# Patient Record
Sex: Male | Born: 1960 | Race: White | Hispanic: No | Marital: Married | Smoking: Never smoker
Health system: Southern US, Community
[De-identification: ages and names within clinical notes are randomized; demographics above are authoritative.]

## PROBLEM LIST (undated history)

## (undated) DIAGNOSIS — F32A Depression, unspecified: Secondary | ICD-10-CM

## (undated) DIAGNOSIS — F329 Major depressive disorder, single episode, unspecified: Secondary | ICD-10-CM

## (undated) DIAGNOSIS — I10 Essential (primary) hypertension: Secondary | ICD-10-CM

## (undated) DIAGNOSIS — E119 Type 2 diabetes mellitus without complications: Secondary | ICD-10-CM

## (undated) DIAGNOSIS — E785 Hyperlipidemia, unspecified: Secondary | ICD-10-CM

## (undated) DIAGNOSIS — I2510 Atherosclerotic heart disease of native coronary artery without angina pectoris: Secondary | ICD-10-CM

## (undated) DIAGNOSIS — K219 Gastro-esophageal reflux disease without esophagitis: Secondary | ICD-10-CM

## (undated) HISTORY — DX: Depression, unspecified: F32.A

## (undated) HISTORY — DX: Major depressive disorder, single episode, unspecified: F32.9

## (undated) HISTORY — PX: APPENDECTOMY: SHX54

## (undated) HISTORY — DX: Atherosclerotic heart disease of native coronary artery without angina pectoris: I25.10

## (undated) HISTORY — DX: Essential (primary) hypertension: I10

## (undated) HISTORY — PX: CORONARY ANGIOPLASTY WITH STENT PLACEMENT: SHX49

---

## 1999-07-03 ENCOUNTER — Encounter: Payer: Self-pay | Admitting: Emergency Medicine

## 1999-07-04 ENCOUNTER — Inpatient Hospital Stay (HOSPITAL_COMMUNITY): Admission: EM | Admit: 1999-07-04 | Discharge: 1999-07-08 | Payer: Self-pay | Admitting: Emergency Medicine

## 1999-07-08 ENCOUNTER — Inpatient Hospital Stay (HOSPITAL_COMMUNITY): Admission: AD | Admit: 1999-07-08 | Discharge: 1999-07-18 | Payer: Self-pay | Admitting: Cardiology

## 1999-07-25 ENCOUNTER — Ambulatory Visit (HOSPITAL_COMMUNITY): Admission: RE | Admit: 1999-07-25 | Discharge: 1999-07-26 | Payer: Self-pay | Admitting: Cardiology

## 1999-07-26 ENCOUNTER — Encounter: Payer: Self-pay | Admitting: Cardiology

## 2000-09-26 ENCOUNTER — Encounter: Payer: Self-pay | Admitting: Cardiology

## 2000-09-26 ENCOUNTER — Inpatient Hospital Stay (HOSPITAL_COMMUNITY): Admission: AD | Admit: 2000-09-26 | Discharge: 2000-09-30 | Payer: Self-pay | Admitting: Cardiology

## 2001-03-03 ENCOUNTER — Observation Stay (HOSPITAL_COMMUNITY): Admission: EM | Admit: 2001-03-03 | Discharge: 2001-03-04 | Payer: Self-pay | Admitting: Emergency Medicine

## 2001-03-03 ENCOUNTER — Encounter: Payer: Self-pay | Admitting: Emergency Medicine

## 2001-03-04 ENCOUNTER — Encounter: Payer: Self-pay | Admitting: Cardiology

## 2001-12-11 ENCOUNTER — Encounter: Payer: Self-pay | Admitting: Cardiology

## 2001-12-11 ENCOUNTER — Inpatient Hospital Stay (HOSPITAL_COMMUNITY): Admission: AD | Admit: 2001-12-11 | Discharge: 2001-12-13 | Payer: Self-pay | Admitting: Cardiology

## 2001-12-13 ENCOUNTER — Encounter: Payer: Self-pay | Admitting: Cardiology

## 2003-04-29 ENCOUNTER — Emergency Department (HOSPITAL_COMMUNITY): Admission: EM | Admit: 2003-04-29 | Discharge: 2003-04-30 | Payer: Self-pay | Admitting: Emergency Medicine

## 2007-01-02 ENCOUNTER — Emergency Department (HOSPITAL_COMMUNITY): Admission: EM | Admit: 2007-01-02 | Discharge: 2007-01-02 | Payer: Self-pay | Admitting: Emergency Medicine

## 2007-01-07 ENCOUNTER — Other Ambulatory Visit (HOSPITAL_COMMUNITY): Admission: RE | Admit: 2007-01-07 | Discharge: 2007-01-18 | Payer: Self-pay | Admitting: Psychiatry

## 2007-01-07 ENCOUNTER — Ambulatory Visit: Payer: Self-pay | Admitting: Psychiatry

## 2007-04-05 ENCOUNTER — Ambulatory Visit: Payer: Self-pay | Admitting: Cardiology

## 2007-05-24 ENCOUNTER — Ambulatory Visit: Payer: Self-pay | Admitting: Cardiology

## 2007-06-02 ENCOUNTER — Emergency Department (HOSPITAL_COMMUNITY): Admission: EM | Admit: 2007-06-02 | Discharge: 2007-06-02 | Payer: Self-pay | Admitting: Emergency Medicine

## 2008-04-16 ENCOUNTER — Emergency Department (HOSPITAL_BASED_OUTPATIENT_CLINIC_OR_DEPARTMENT_OTHER): Admission: EM | Admit: 2008-04-16 | Discharge: 2008-04-16 | Payer: Self-pay | Admitting: Emergency Medicine

## 2008-05-06 ENCOUNTER — Ambulatory Visit: Payer: Self-pay | Admitting: Cardiology

## 2009-06-21 DIAGNOSIS — E785 Hyperlipidemia, unspecified: Secondary | ICD-10-CM | POA: Insufficient documentation

## 2009-06-21 DIAGNOSIS — I2510 Atherosclerotic heart disease of native coronary artery without angina pectoris: Secondary | ICD-10-CM | POA: Insufficient documentation

## 2009-06-21 DIAGNOSIS — J45909 Unspecified asthma, uncomplicated: Secondary | ICD-10-CM | POA: Insufficient documentation

## 2009-06-21 DIAGNOSIS — I10 Essential (primary) hypertension: Secondary | ICD-10-CM | POA: Insufficient documentation

## 2009-06-24 ENCOUNTER — Ambulatory Visit: Payer: Self-pay | Admitting: Cardiology

## 2009-07-06 ENCOUNTER — Telehealth: Payer: Self-pay | Admitting: Cardiology

## 2010-04-16 ENCOUNTER — Ambulatory Visit: Payer: Self-pay | Admitting: Diagnostic Radiology

## 2010-04-16 ENCOUNTER — Encounter: Payer: Self-pay | Admitting: Emergency Medicine

## 2010-04-17 ENCOUNTER — Encounter (INDEPENDENT_AMBULATORY_CARE_PROVIDER_SITE_OTHER): Payer: Self-pay | Admitting: Internal Medicine

## 2010-04-17 ENCOUNTER — Ambulatory Visit: Payer: Self-pay | Admitting: Cardiology

## 2010-04-17 ENCOUNTER — Inpatient Hospital Stay (HOSPITAL_COMMUNITY): Admission: EM | Admit: 2010-04-17 | Discharge: 2010-04-20 | Payer: Self-pay | Admitting: Cardiology

## 2010-05-03 ENCOUNTER — Encounter: Payer: Self-pay | Admitting: Cardiology

## 2010-05-12 ENCOUNTER — Encounter: Payer: Self-pay | Admitting: Physician Assistant

## 2010-05-12 ENCOUNTER — Ambulatory Visit: Payer: Self-pay | Admitting: Cardiology

## 2010-07-05 ENCOUNTER — Ambulatory Visit: Payer: Self-pay | Admitting: Cardiology

## 2010-10-04 NOTE — Assessment & Plan Note (Signed)
Summary: eph   Visit Type:  Follow-up Primary Provider:  Leonette Most   History of Present Illness: This is a 50 year old white male patient who was admitted to the hospital with unstable angina. He has a history of a previous stent to the RCA with intervention in 2002 for in-stent restenosis. He underwent cardiac catheterization in April 18, 2010 and was found to have a high-grade AV circumflex stenosis. He underwent percutaneous angioplasty to the distal AV circumflex April 19, 2010.  The patient says he is doing much better since he's been home he has an occasional twinge of chest tightness but he says this is very different from his angina. He describes his angina as being a heaviness with numbness and tingling in his left hand. He has not experienced any of this. He is back to work but does not exercise on a regular basis. He does not smoke. He denies dyspnea dyspnea on exertion dizziness or presyncope.  Current Medications (verified): 1)  Zetia 10 Mg Tabs (Ezetimibe) .... Take One Tablet By Mouth Daily. 2)  Crestor 40 Mg Tabs (Rosuvastatin Calcium) .... Take One Tablet By Mouth Daily. 3)  Nexium 40 Mg Cpdr (Esomeprazole Magnesium) .... Take 1 Capsule By Mouth Once A Day 4)  Singulair 10 Mg Tabs (Montelukast Sodium) .... Take 1 Tablet By Mouth Once A Day 5)  Mirtazapine 15 Mg Tabs (Mirtazapine) .... Take 1 Tablet By Mouth Once A Day 6)  Aspirin 81 Mg Tbec (Aspirin) .... Take One Tablet By Mouth Daily 7)  Lorazepam 0.5 Mg Tabs (Lorazepam) .... As Needed 8)  Metoprolol Succinate 25 Mg Xr24h-Tab (Metoprolol Succinate) .... Take One Tablet By Mouth Daily 9)  Wellbutrin Xl 300 Mg Xr24h-Tab (Bupropion Hcl) .... Take 1 Tablet By Mouth Once A Day 10)  Fluoxetine Hcl 40 Mg Caps (Fluoxetine Hcl) .... Take 1 Capsule By Mouth Two Times A Day  Allergies (verified): 1)  ! * Dextromethorphan  Past History:  Past Medical History: Last updated: 06/21/2009 Current Problems:  CAD  (ICD-414.00) HYPERTENSION, UNSPECIFIED (ICD-401.9) DYSLIPIDEMIA (ICD-272.4) ASTHMA (ICD-493.90)  Social History: Reviewed history from 06/21/2009 and no changes required.  The patient is married with two children.  He denies tobacco or ethanol use. works at [employer]  Review of Systems       see history of present illness  Vital Signs:  Patient profile:   50 year old male Height:      65 inches Weight:      171 pounds BMI:     28.56 Pulse rate:   65 / minute BP sitting:   124 / 76  (left arm) Cuff size:   large  Vitals Entered By: Burnett Kanaris, CNA (May 12, 2010 8:15 AM)  Physical Exam  General:   Well-nournished, in no acute distress. Neck: No JVD, HJR, Bruit, or thyroid enlargement Lungs: No tachypnea, clear without wheezing, rales, or rhonchi Cardiovascular: RRR, PMI not displaced, heart sounds normal, no murmurs, gallops, bruit, thrill, or heave. Abdomen: BS normal. Soft without organomegaly, masses, lesions or tenderness. Extremities:right groin without hematoma or hemorrhage, right radial artery without hematoma or hemorrhage good, brachial pulse, lower extremities without cyanosis, clubbing or edema. Good distal pulses bilateral SKin: Warm, no lesions or rashes  Musculoskeletal: No deformities Neuro: no focal signs    EKG  Procedure date:  05/12/2010  Findings:      normal sinus rhythm  Impression & Recommendations:  Problem # 1:  CAD (ICD-414.00) Patient underwent percutaneous angioplasty to the distal  AV circumflex April 19, 2010. He is prior stenting of an RCA in 2000 with recurrent intervention in 2002. Patient is stable. Continue aspirin 325 mg daily. The following medications were removed from the medication list:    Aspirin 81 Mg Tbec (Aspirin) .Marland Kitchen... Take one tablet by mouth daily His updated medication list for this problem includes:    Metoprolol Succinate 25 Mg Xr24h-tab (Metoprolol succinate) .Marland Kitchen... Take one tablet by mouth  daily  Orders: EKG w/ Interpretation (93000)  Problem # 2:  HYPERTENSION, UNSPECIFIED (ICD-401.9) blood pressure is controlled The following medications were removed from the medication list:    Aspirin 81 Mg Tbec (Aspirin) .Marland Kitchen... Take one tablet by mouth daily His updated medication list for this problem includes:    Metoprolol Succinate 25 Mg Xr24h-tab (Metoprolol succinate) .Marland Kitchen... Take one tablet by mouth daily    Aspirin 325 Mg Tabs (Aspirin) .Marland Kitchen... Take one tablet by mouth once a day.  Problem # 3:  DYSLIPIDEMIA (ICD-272.4) treated His updated medication list for this problem includes:    Zetia 10 Mg Tabs (Ezetimibe) .Marland Kitchen... Take one tablet by mouth daily.    Crestor 40 Mg Tabs (Rosuvastatin calcium) .Marland Kitchen... Take one tablet by mouth daily.  Patient Instructions: 1)  Your physician recommends that you schedule a follow-up appointment in: 2 months with Dr. Riley Kill 2)  Your physician recommends that you continue on your current medications as directed. Please refer to the Current Medication list given to you today. 3)  Your physician discussed the importance of regular exercise and recommended that you start or continue a regular exercise program for good health.

## 2010-10-04 NOTE — Assessment & Plan Note (Signed)
Summary: 2 month rov   Visit Type:  Follow-up Primary Gera Inboden:  Alexander Calderon  CC:  No complaints.  History of Present Illness: Doing well at the present time.  Not having any chest pain.  Drinks 3-4 beers two times per week, and we discussed this at some length.  Is able to do whatever he wants without difficulty.  Denies ongoing problems.    Current Medications (verified): 1)  Zetia 10 Mg Tabs (Ezetimibe) .... Take One Tablet By Mouth Daily. 2)  Crestor 40 Mg Tabs (Rosuvastatin Calcium) .... Take One Tablet By Mouth Daily. 3)  Nexium 40 Mg Cpdr (Esomeprazole Magnesium) .... Take 1 Capsule By Mouth Once A Day 4)  Singulair 10 Mg Tabs (Montelukast Sodium) .... Take 1 Tablet By Mouth Once A Day 5)  Mirtazapine 15 Mg Tabs (Mirtazapine) .... Take 1 Tablet By Mouth Once A Day 6)  Lorazepam 0.5 Mg Tabs (Lorazepam) .... As Needed 7)  Metoprolol Succinate 25 Mg Xr24h-Tab (Metoprolol Succinate) .... Take One Tablet By Mouth Daily 8)  Wellbutrin Xl 300 Mg Xr24h-Tab (Bupropion Hcl) .... Take 1 Tablet By Mouth Once A Day 9)  Fluoxetine Hcl 40 Mg Caps (Fluoxetine Hcl) .... Take 1 Capsule By Mouth Two Times A Day 10)  Aspirin 325 Mg Tabs (Aspirin) .... Take One Tablet By Mouth Once A Day.  Allergies: 1)  ! * Dextromethorphan  Past History:  Past Medical History: Last updated: 06/21/2009 Current Problems:  CAD (ICD-414.00) HYPERTENSION, UNSPECIFIED (ICD-401.9) DYSLIPIDEMIA (ICD-272.4) ASTHMA (ICD-493.90)  Past Surgical History: Last updated: 06/21/2009 Multiple stent placements to the right coronary artery. Appendectomy  Family History: Last updated: 06/21/2009  Notable for father dying at age 57 with myocardial infarction.  Social History: Last updated: 05/12/2010  The patient is married with two children.  He denies tobacco or ethanol use. works at [employer]  Vital Signs:  Patient profile:   50 year old male Height:      65 inches Weight:      169.25 pounds BMI:      28.27 Pulse rate:   72 / minute Pulse rhythm:   regular Resp:     18 per minute BP sitting:   118 / 80  (left arm) Cuff size:   large  Vitals Entered By: Vikki Ports (July 05, 2010 4:04 PM)  Physical Exam  General:  Well developed, well nourished, in no acute distress. Head:  normocephalic and atraumatic Eyes:  PERRLA/EOM intact; conjunctiva and lids normal. Lungs:  Clear bilaterally to auscultation and percussion. Heart:  PMI non displaced. Normal S1 and S2.  No definite murmur.   Pulses:  pulses normal in all 4 extremities Extremities:  No clubbing or cyanosis. Neurologic:  Alert and oriented x 3.   Impression & Recommendations:  Problem # 1:  CAD (ICD-414.00) Gernerally stable. PCI of CFX artery distally with improvement in symtpoms shortly thereafter.  Will continue on medical therpay.  Currently on very low dose.  Will reduce ASA to 81 mg at present.  His updated medication list for this problem includes:    Metoprolol Succinate 25 Mg Xr24h-tab (Metoprolol succinate) .Marland Kitchen... Take one tablet by mouth daily    Aspirin 81 Mg Tbec (Aspirin) .Marland Kitchen... Take one tablet by mouth daily  Problem # 2:  HYPERTENSION, UNSPECIFIED (ICD-401.9) BP controlled at present.   His updated medication list for this problem includes:    Metoprolol Succinate 25 Mg Xr24h-tab (Metoprolol succinate) .Marland Kitchen... Take one tablet by mouth daily    Aspirin  81 Mg Tbec (Aspirin) .Marland Kitchen... Take one tablet by mouth daily  His updated medication list for this problem includes:    Metoprolol Succinate 25 Mg Xr24h-tab (Metoprolol succinate) .Marland Kitchen... Take one tablet by mouth daily    Aspirin 325 Mg Tabs (Aspirin) .Marland Kitchen... Take one tablet by mouth once a day.  Problem # 3:  DYSLIPIDEMIA (ICD-272.4) Followed by Dr. Leonette Calderon.  His updated medication list for this problem includes:    Zetia 10 Mg Tabs (Ezetimibe) .Marland Kitchen... Take one tablet by mouth daily.    Crestor 40 Mg Tabs (Rosuvastatin calcium) .Marland Kitchen... Take one tablet by mouth  daily.  His updated medication list for this problem includes:    Zetia 10 Mg Tabs (Ezetimibe) .Marland Kitchen... Take one tablet by mouth daily.    Crestor 40 Mg Tabs (Rosuvastatin calcium) .Marland Kitchen... Take one tablet by mouth daily.  Problem # 4:  ASTHMA (ICD-493.90) No current symptoms.  Watch use of beta blockers----should not be increased.  His updated medication list for this problem includes:    Singulair 10 Mg Tabs (Montelukast sodium) .Marland Kitchen... Take 1 tablet by mouth once a day  Patient Instructions: 1)  Your physician wants you to follow-up in: 6 MONTHS.  You will receive a reminder letter in the mail two months in advance. If you don't receive a letter, please call our office to schedule the follow-up appointment. 2)  Your physician has recommended you make the following change in your medication: DECREASE Aspirin to 81mg  daily

## 2010-10-04 NOTE — Miscellaneous (Signed)
  Clinical Lists Changes  Observations: Added new observation of CARDCATHFIND: CONCLUSION:  Successful percutaneous balloon only angioplasty of the distal circumflex coronary artery as described above.   DISPOSITION:  The patient has had continued chest pain.  Importantly, he previously had chest pain which was somewhat difficult to resolve prior to intervention.  The current situation is that we will need to continue to follow him on dual antiplatelet therapy.  We will monitor him closely.   (04/19/2010 13:50) Added new observation of CARDCATHFIND: CONCLUSION: 1. Continued patency of the previous RCA stent. 2. High-grade AV circumflex stenosis.   DISPOSITION:  I had Dr. Juanda Chance come into the room and review the films with me.  We leaned towards percutaneous intervention given the patient's symptoms.  I will review this with him.  Our plan is to approach this from the femoral approach given the radial spasm today.   (04/18/2010 13:51) Added new observation of ECHOINTERP:  - Left ventricle: The cavity size was normal. Wall thickness was     increased in a pattern of mild LVH. Systolic function was normal.     The estimated ejection fraction was in the range of 55% to 60%.     Wall motion was normal; there were no regional wall motion     abnormalities.   - Aortic valve: Mildly calcified annulus. Trileaflet; normal     thickness leaflets. (04/17/2010 13:51)      Cardiac Cath  Procedure date:  04/19/2010  Findings:      CONCLUSION:  Successful percutaneous balloon only angioplasty of the distal circumflex coronary artery as described above.   DISPOSITION:  The patient has had continued chest pain.  Importantly, he previously had chest pain which was somewhat difficult to resolve prior to intervention.  The current situation is that we will need to continue to follow him on dual antiplatelet therapy.  We will monitor him closely.    Cardiac Cath  Procedure date:   04/18/2010  Findings:      CONCLUSION: 1. Continued patency of the previous RCA stent. 2. High-grade AV circumflex stenosis.   DISPOSITION:  I had Dr. Juanda Chance come into the room and review the films with me.  We leaned towards percutaneous intervention given the patient's symptoms.  I will review this with him.  Our plan is to approach this from the femoral approach given the radial spasm today.    Echocardiogram  Procedure date:  04/17/2010  Findings:       - Left ventricle: The cavity size was normal. Wall thickness was     increased in a pattern of mild LVH. Systolic function was normal.     The estimated ejection fraction was in the range of 55% to 60%.     Wall motion was normal; there were no regional wall motion     abnormalities.   - Aortic valve: Mildly calcified annulus. Trileaflet; normal     thickness leaflets.

## 2010-11-17 LAB — CBC
HCT: 41.3 % (ref 39.0–52.0)
HCT: 41.5 % (ref 39.0–52.0)
HCT: 41.7 % (ref 39.0–52.0)
Hemoglobin: 14.1 g/dL (ref 13.0–17.0)
Hemoglobin: 14.1 g/dL (ref 13.0–17.0)
Hemoglobin: 14.1 g/dL (ref 13.0–17.0)
MCH: 28.3 pg (ref 26.0–34.0)
MCH: 28.5 pg (ref 26.0–34.0)
MCH: 28.5 pg (ref 26.0–34.0)
MCHC: 33.8 g/dL (ref 30.0–36.0)
MCHC: 34 g/dL (ref 30.0–36.0)
MCHC: 34.1 g/dL (ref 30.0–36.0)
MCV: 83.6 fL (ref 78.0–100.0)
MCV: 83.7 fL (ref 78.0–100.0)
MCV: 83.8 fL (ref 78.0–100.0)
Platelets: 167 10*3/uL (ref 150–400)
Platelets: 193 10*3/uL (ref 150–400)
RBC: 4.94 MIL/uL (ref 4.22–5.81)
RBC: 4.98 MIL/uL (ref 4.22–5.81)
RDW: 13 % (ref 11.5–15.5)
RDW: 13.1 % (ref 11.5–15.5)
WBC: 3.9 10*3/uL — ABNORMAL LOW (ref 4.0–10.5)
WBC: 6.2 10*3/uL (ref 4.0–10.5)

## 2010-11-17 LAB — BASIC METABOLIC PANEL
BUN: 11 mg/dL (ref 6–23)
BUN: 9 mg/dL (ref 6–23)
CO2: 30 mEq/L (ref 19–32)
CO2: 31 mEq/L (ref 19–32)
Calcium: 9.6 mg/dL (ref 8.4–10.5)
Calcium: 9.7 mg/dL (ref 8.4–10.5)
Chloride: 100 mEq/L (ref 96–112)
Chloride: 101 mEq/L (ref 96–112)
Creatinine, Ser: 0.99 mg/dL (ref 0.4–1.5)
Creatinine, Ser: 1.03 mg/dL (ref 0.4–1.5)
GFR calc Af Amer: >60 mL/min (ref >60)
GFR calc Af Amer: >60 mL/min (ref >60)
GFR calc non Af Amer: >60 mL/min (ref >60)
GFR calc non Af Amer: >60 mL/min (ref >60)
Glucose, Bld: 113 mg/dL — ABNORMAL HIGH (ref 70–99)
Glucose, Bld: 130 mg/dL — ABNORMAL HIGH (ref 70–99)
Potassium: 4.3 mEq/L (ref 3.5–5.1)
Potassium: 4.5 mEq/L (ref 3.5–5.1)
Sodium: 140 mEq/L (ref 135–145)
Sodium: 140 mEq/L (ref 135–145)

## 2010-11-17 LAB — PLATELET INHIBITION P2Y12
P2Y12 % Inhibition: 23 %
Platelet Function  P2Y12: 235 [PRU] (ref 194–418)
Platelet Function Baseline: 306 [PRU] (ref 194–418)

## 2010-11-17 LAB — GLUCOSE, CAPILLARY: Glucose-Capillary: 108 mg/dL — ABNORMAL HIGH (ref 70–99)

## 2010-11-17 LAB — HEPARIN LEVEL (UNFRACTIONATED)
Heparin Unfractionated: 0.54 IU/mL (ref 0.30–0.70)
Heparin Unfractionated: <0.1 IU/mL — ABNORMAL LOW (ref 0.30–0.70)
Heparin Unfractionated: <0.1 IU/mL — ABNORMAL LOW (ref 0.30–0.70)

## 2010-11-18 LAB — CARDIAC PANEL(CRET KIN+CKTOT+MB+TROPI)
CK, MB: 1.8 ng/mL (ref 0.3–4.0)
CK, MB: 2 ng/mL (ref 0.3–4.0)
CK, MB: 2.2 ng/mL (ref 0.3–4.0)
Relative Index: 1.9 (ref 0.0–2.5)
Relative Index: 2 (ref 0.0–2.5)
Relative Index: INVALID (ref 0.0–2.5)
Total CK: 103 U/L (ref 7–232)
Total CK: 108 U/L (ref 7–232)
Total CK: 97 U/L (ref 7–232)
Troponin I: 0.01 ng/mL (ref 0.00–0.06)
Troponin I: <0.01 ng/mL (ref 0.00–0.06)
Troponin I: <0.01 ng/mL (ref 0.00–0.06)

## 2010-11-18 LAB — PROTIME-INR
INR: 0.97 (ref 0.00–1.49)
Prothrombin Time: 13.1 seconds (ref 11.6–15.2)

## 2010-11-18 LAB — CBC
HCT: 38.2 % — ABNORMAL LOW (ref 39.0–52.0)
HCT: 42.4 % (ref 39.0–52.0)
Hemoglobin: 12.7 g/dL — ABNORMAL LOW (ref 13.0–17.0)
MCH: 28 pg (ref 26.0–34.0)
MCHC: 33.2 g/dL (ref 30.0–36.0)
MCV: 84.1 fL (ref 78.0–100.0)
Platelets: 211 10*3/uL (ref 150–400)
Platelets: 293 10*3/uL (ref 150–400)
RBC: 4.54 MIL/uL (ref 4.22–5.81)
RDW: 13 % (ref 11.5–15.5)
RDW: 12.2 % (ref 11.5–15.5)
WBC: 5.9 10*3/uL (ref 4.0–10.5)
WBC: 8.5 10*3/uL (ref 4.0–10.5)

## 2010-11-18 LAB — COMPREHENSIVE METABOLIC PANEL
ALT: 61 U/L — ABNORMAL HIGH (ref 0–53)
AST: 37 U/L (ref 0–37)
Albumin: 3.6 g/dL (ref 3.5–5.2)
Alkaline Phosphatase: 95 U/L (ref 39–117)
BUN: 12 mg/dL (ref 6–23)
CO2: 29 mEq/L (ref 19–32)
Calcium: 9 mg/dL (ref 8.4–10.5)
Chloride: 101 mEq/L (ref 96–112)
Creatinine, Ser: 1.11 mg/dL (ref 0.4–1.5)
GFR calc Af Amer: >60 mL/min (ref >60)
GFR calc non Af Amer: >60 mL/min (ref >60)
Glucose, Bld: 220 mg/dL — ABNORMAL HIGH (ref 70–99)
Potassium: 3.7 mEq/L (ref 3.5–5.1)
Sodium: 140 mEq/L (ref 135–145)
Total Bilirubin: 0.5 mg/dL (ref 0.3–1.2)
Total Protein: 6.1 g/dL (ref 6.0–8.3)

## 2010-11-18 LAB — DIFFERENTIAL
Basophils Absolute: 0.1 10*3/uL (ref 0.0–0.1)
Lymphocytes Relative: 15 % (ref 12–46)
Neutro Abs: 6.6 10*3/uL (ref 1.7–7.7)
Neutrophils Relative %: 79 % — ABNORMAL HIGH (ref 43–77)

## 2010-11-18 LAB — LIPID PANEL
Cholesterol: 138 mg/dL (ref 0–200)
HDL: 42 mg/dL (ref >39)
LDL Cholesterol: 37 mg/dL (ref 0–99)
Total CHOL/HDL Ratio: 3.3 RATIO
Triglycerides: 295 mg/dL — ABNORMAL HIGH (ref <150)
VLDL: 59 mg/dL — ABNORMAL HIGH (ref 0–40)

## 2010-11-18 LAB — HEPARIN LEVEL (UNFRACTIONATED)
Heparin Unfractionated: 0.1 IU/mL — ABNORMAL LOW (ref 0.30–0.70)
Heparin Unfractionated: 0.7 IU/mL (ref 0.30–0.70)

## 2010-11-18 LAB — POCT CARDIAC MARKERS
CKMB, poc: 2.9 ng/mL (ref 1.0–8.0)
Myoglobin, poc: 73.8 ng/mL (ref 12–200)
Troponin i, poc: <0.05 ng/mL (ref 0.00–0.09)

## 2010-11-18 LAB — OPIATE, QUANTITATIVE, URINE
Codeine Urine: NEGATIVE NG/ML
Hydrocodone: NEGATIVE NG/ML
Hydromorphone GC/MS Conf: NEGATIVE NG/ML
Morphine, Confirm: 1469 NG/ML — ABNORMAL HIGH
Oxycodone, ur: NEGATIVE NG/ML
Oxymorphone: NEGATIVE NG/ML

## 2010-11-18 LAB — DRUGS OF ABUSE SCREEN W/O ALC, ROUTINE URINE
Amphetamine Screen, Ur: NEGATIVE
Barbiturate Quant, Ur: NEGATIVE
Benzodiazepines.: NEGATIVE
Cocaine Metabolites: NEGATIVE
Creatinine,U: 198.6 mg/dL
Marijuana Metabolite: NEGATIVE
Methadone: NEGATIVE
Opiate Screen, Urine: POSITIVE — AB
Phencyclidine (PCP): NEGATIVE
Propoxyphene: NEGATIVE

## 2010-11-18 LAB — APTT: aPTT: 48 seconds — ABNORMAL HIGH (ref 24–37)

## 2010-11-18 LAB — BASIC METABOLIC PANEL
BUN: 16 mg/dL (ref 6–23)
Calcium: 9.8 mg/dL (ref 8.4–10.5)
Creatinine, Ser: 1 mg/dL (ref 0.4–1.5)
GFR calc non Af Amer: >60 mL/min (ref >60)
Potassium: 3.8 mEq/L (ref 3.5–5.1)

## 2010-11-18 LAB — TSH: TSH: 1.73 u[IU]/mL (ref 0.350–4.500)

## 2010-11-18 LAB — BRAIN NATRIURETIC PEPTIDE: Pro B Natriuretic peptide (BNP): <30 pg/mL (ref 0.0–100.0)

## 2010-11-18 LAB — HEMOGLOBIN A1C
Hgb A1c MFr Bld: 6.4 % — ABNORMAL HIGH (ref <5.7)
Mean Plasma Glucose: 137 mg/dL — ABNORMAL HIGH (ref <117)

## 2010-11-18 LAB — MAGNESIUM: Magnesium: 2 mg/dL (ref 1.5–2.5)

## 2011-01-17 NOTE — Letter (Signed)
May 06, 2008    Dr. Loyal Jacobson, MD  [address]  [facility]  [place], [place] [address]   RE:  [name], [name]  MRN:  [id number]  /  DOB:  [birth year]   Dear Kathlene November:   I had the pleasure of seeing Alexander Calderon in the office today in  followup.  Cardiac wise, he seems to be getting along well.  He  apparently developed a second episode of the uvular swelling and he was  seen in the emergency room in [facility].  Importantly, he thinks, it  may have been related to [employer].  He does not remember what  he had the first time.  Nonetheless, he has had some laboratory testing  and understands he is supposed to see an allergist.  Cardiac wise, he  seems to be getting along well.  He told me that he has cholesterol  checked in your office.   MEDICATIONS:  1. Allegra 180 mg daily.  2. Zetia 10 mg daily.  3. Singulair 10 mg daily.  4. Nexium 40 mg daily.  5. Crestor 40 mg daily.  6. Fluoxetine 80 mg.  7. Mirtazapine 15 mg.  8. Enteric-coated aspirin 81 mg.  9. Metoprolol 25 mg p.o. b.i.d.   On physical, he is alert and oriented gentleman in no distress.  Blood  pressure is 120/70 and the pulse is 73.  The lung fields are clear and  the cardiac rhythm is regular.   The electrocardiogram demonstrates normal sinus rhythm essentially  within normal limits.   Overall, the patient appears to be stable from a cardiac standpoint.  We  will see him back in 1 year's time.  His electrocardiogram today is  essentially normal.  If he has have any problems in the interim, please  do not hesitate to let me know.  The goal of his LDL should be 70 or  less.  I appreciate the opportunity of sharing in his care.    Sincerely,      Arturo Morton. Riley Kill, MD, [facility]  Electronically Signed    TDS/MedQ  DD: 05/06/2008  DT: 05/07/2008  Job #: [id number]

## 2011-01-17 NOTE — Procedures (Signed)
[facility]                              EXERCISE TREADMILL   URIAN, MARTENSON                         MRN:          [id number]  DATE:05/24/2007                            DOB:          [birth year]    DURATION OF EXERCISE:  Nine minutes 24 seconds.   MAXIMUM HEART RATE:  1. Percent of PMHR is 81%.   COMMENTS:  The patient exercised today on the Bruce protocol.  He  completed nine minutes of exercise without chest pain.  Blood pressure  rose appropriately.  Peak heart rate reached 81% or age predicted  maximum.  There were no electrocardiographic abnormalities noted and no  significant horizontal ST depression.  There were no significant  arrhythmias either.   The patient has previously undergone percutaneous intervention of the  right coronary artery.  Current study demonstrates no evidence of  exercise induced chest pain or significant ST depression.  It is  nondiagnostic due to the family to achieve 85% of an age predicted  maximum.  This was mostly limited by his overall exercise activity.  Importantly, he does have some mild elevation in his blood pressure  which has been noted recently.  He has seen Dr. Leonette Most in follow-up and  I have recommended he continue to see him on a regular basis.  We will  see him back in a year in cardiology clinic.     Arturo Morton. Riley Kill, MD, [facility]  Electronically Signed    TDS/MedQ  DD: 05/24/2007  DT: 05/24/2007  Job #: [id number]   cc:   Loyal Jacobson, M.D.

## 2011-01-17 NOTE — Letter (Signed)
April 05, 2007    Loyal Jacobson, MD  [facility]  [address]  [place], [place] [address]   RE:  Alexander Calderon, Alexander Calderon  MRN:  [id number]  /  DOB:  December 30, 1960   Dear Kathlene November:   I had the pleasure of seeing Yoseph Haile in the office today in follow  up. As a general rule, cardiac wise he is getting along well. As you  know, he has had some ups and downs psychologically recently, and has  been placed on antidepressant medication. His weight has also gone up  fairly significantly. In the past, he has never been hypertensive, but  currently at the present time, he does have some level of hypertension.   PHYSICAL EXAMINATION:  VITAL SIGNS:  Weight 168, blood pressure 158/94,  and the pulse actually was 95 which is a little faster than usual.  CARDIAC:  Rhythm was regular without a significant murmur, rub, or  gallop.  LUNGS:  Fields were clear to auscultation and percussion.  ABDOMEN:  Soft without any obvious hepatosplenomegaly.  EXTREMITIES:  Importantly, there was no lower extremity edema.   EKG revealed normal sinus rhythm with borderline sinus tachycardia,  otherwise unremarkable.   I have reviewed his records. His labs have been intact. I think it would  be worthwhile for him to have his blood pressure checked and I have  asked him to go to the drug store to have it checked on a digital  machine, three to four times before he sees you back on August 25th. At  that time, if he remains elevated, he probably will require additional  treatment. Some of it may be related to his weight which has increased  rather substantially over the past few years. Also, with the  tachycardia, this will need to be watched.   I appreciate the opportunity of sharing in his care and we will see him  back in follow up as needed. We will be happy to see him again in one  year. Maintenance of his LDL under 70 would be optimal. Thanks for  allowing me to share in his care.     Sincerely,      Arturo Morton. Riley Kill, MD, [facility]  Electronically Signed    TDS/MedQ  DD: 04/05/2007  DT: 04/06/2007  Job #: [id number]

## 2011-01-20 NOTE — H&P (Signed)
[facility]  Patient:    Alexander Calderon, Alexander Calderon Visit Number: [id number] MRN: [id number]          Service Type: MED Location: 2000 2041 [id number] Attending Physician:  Ronaldo Miyamoto Dictated by:   Doylene Canning. Ladona Ridgel, M.D. [facility] Admit Date:  12/11/2001   CC:         Suzanna Obey, M.D., [facility], [place]  Maisie Fus D. Riley Kill, M.D. [facility]   History and Physical  ADMITTING DIAGNOSIS: Unstable angina.  HISTORY OF PRESENT ILLNESS: The patient is a very pleasant 50 year old young man, with a strong family history of coronary disease and a history of premature atherosclerosis, with myocardial infarction in the past.  At that time he had right coronary disease (single-vessel).  He has had a stent to the RCA and restenting to the distal RCA several years ago.  He had a Cardiolite stress test back in June 2002 which was reportedly notable for no evidence of ischemia and preserved LV systolic function.  The patient was in his usual state of health until approximately five days ago when he began experiencing intermittent right jaw pain.  It should be noted that previously his symptoms when he presented with his initial myocardial infarction were of chest pain but at other times the patient has had right jaw pain in the setting of acute coronary syndrome.  The pain has waxed and waned and did not respond to nonsteroidal anti-inflammatory drugs.  There was no associated chest pain or radiation to the arms, shortness of breath, diaphoresis, or nausea and vomiting.  While he continues to have intermittent jaw pain he has not had any neck pain.  ALLERGIES: DEXTROMETHORPHAN.  MEDICATIONS:  1. Lipitor 80 mg q.d.  2. Singulair 10 mg q.d.  3. Albuterol inhalers.  4. Protonix 40 mg q.d.  5. Prozac 40 mg q.d.  6. Allegra.  7. Wellbutrin.  8. Toprol-XL 25 mg q.d.  9. Aspirin.  PAST MEDICAL HISTORY:  1. As previously noted, he had a non-Q wave MI with stent to the RCA in  November 2000.  This procedure was complicated by distal embolization.  He     had preserved LV function.  His repeat stenting to the distal RCA after     that.  His restenting procedure was complicated by a troponin bump post     intervention.  2. Asthma diagnosed at age 45.  3. TMJ syndrome.  4. Status post appendectomy.  5. History of depression.  6. History of spastic colon.  REVIEW OF SYSTEMS: Notable for no vision or hearing changes.  He denies any headache.  He denies any difficulty swallowing.  He denies any nausea, vomiting, diaphoresis, diarrhea, or constipation.  He denies chest pain or shortness of breath.  He denies cough or hemoptysis.  He denies any arthritic symptoms.  He denies nocturia, dysuria, or hematuria.  He denies any weakness, numbness, or other neurologic symptoms except as previously described.  SOCIAL HISTORY: The patient is married with two children.  He denies tobacco or ethanol use.  FAMILY HISTORY: Notable for father dying at age 75 with myocardial infarction.  PHYSICAL EXAMINATION:  GENERAL: The patient is a pleasant, well-appearing young man in no distress.  VITAL SIGNS: Blood pressure 138/100, pulse 70 and regular.  Weight 97.5 kg. Respirations 20.  Oxygen saturation 96%.  HEENT: Normocephalic, atraumatic.  PERRL.  Oropharynx moist.  NECK: No jugular venous distention.  No thyromegaly.  Trachea midline.  CARDIOVASCULAR: Regular rate and rhythm  with normal S1 and S2.  There was an S4 gallop.  There were no murmurs appreciated.  LUNGS: Clear bilaterally to auscultation.  ABDOMEN: Soft, nontender, nondistended.  No organomegaly.  EXTREMITIES: No clubbing, cyanosis, or edema.  SKIN: Warm and dry.  NEUROLOGIC: He was alert and oriented x3.  Cranial nerves II-XII intact.  LABORATORY DATA: EKG demonstrates normal sinus rhythm with nonspecific ST-T wave changes.  IMPRESSION:  1. Recurrent jaw pain similar to the patients symptoms when he  had previous     stenting (questionable anginal equivalent).  2. History of coronary disease, status post non-Q wave myocardial infarction     in the past.  3. Hypertension.  4. Hyperlipidemia.  PLAN: I have discussed the treatment options with the patient.  We will plan to admit him overnight and obtain serial cardiac enzymes.  Will plan on obtaining exercise treadmill testing in a.m. unless his cardiac enzymes turn positive suggestive of myocardial infarction. Dictated by:   Doylene Canning. Ladona Ridgel, M.D. [facility] Attending Physician:  Ronaldo Miyamoto DD:  12/11/01 TD:  12/12/01 Job: [id number]

## 2011-01-20 NOTE — Discharge Summary (Signed)
[place]. [facility]  Patient:    Alexander Calderon, Alexander Calderon Visit Number: [id number] MRN: [id number]          Service Type: Attending:  Arturo Morton. Riley Kill, M.D. [facility] Dictated by:   Pennelope Bracken, N.P. Adm. Date:  12/11/01 Disc. Date: 12/13/01   CC:         Suzanna Obey in [facility]   Discharge Summary  DATE OF BIRTH:  April 03, 1961  REASON FOR ADMISSION:  Chest pain.  DISCHARGE DIAGNOSES: 1. Coronary artery disease status post non-Q-wave myocardial infarction with    stent to the right coronary artery in November 2000 complicated by distal    embolization with normal left ventricular function.  Subsequently (date not    available) there was a stent to the right coronary artery at the previous    distal stent edge, reducing stenosis there from 70% to 30%, again with    normal left ventricular function and normal left anterior descending artery    and circumflex arteries.  This stent placement complicated by troponin    increase post percutaneous coronary intervention.  Normal adequate    stress Cardiolite with an ejection fraction of 61% in June 2002. 2. Irritable bowel syndrome. 3. Asthma. 4. Temporomandibular joint. 5. Appendectomy. 6. Depression.  HISTORY OF PRESENT ILLNESS:  This delightful young fellow presented to [place] for evaluation of a five-day history of right jaw pain which is not related to exertion or strenuous activity.  The pain came and went and had not responded to NSAIDS.  There was no associated chest pain, shortness of breath, or diaphoresis.  The patient did have some jaw pain while undergoing placement of his second stent so it was deemed prudent to admit him for further evaluation.  HOSPITAL COURSE:  The patient was admitted to telemetry and maintained on his home medications.  Serial enzymes were drawn and these were negative in three sets.  The patient was started on IV heparin and nitroglycerin with improvement in his jaw pain.  The  patient did admit that he has a tooth in the area of the jaw pain that was scheduled for extraction.  The patient was taken for treadmill Cardiolite hospital day #2 and this was read as negative for ischemia with an EF of 62%.  At this point the patient was scheduled for a tooth extraction to be performed.  PHYSICAL EXAMINATION:  SUBJECTIVE:  Day of discharge, the patient offered no complaints of jaw pain. He denied chest pain and shortness of breath.  VITAL SIGNS:  Blood pressure 110/80, pulse 60, respirations 20, temperature 97.0, saturation 97% on room air.  Telemetry normal sinus rhythm.  GENERAL:  NAD.  HEART:  Regular rate and rhythm.  No murmur, rub, or gallop.  LUNGS:  Clear to auscultation bilaterally.  EXTREMITIES:  Without cyanosis, clubbing, or edema.  LABORATORY DATA:  Admission chemistry:  Sodium 138, potassium 3.5, BUN 16, creatinine 0.7, glucose 103.  Liver panel within normal limits.  Cholesterol panel:  Total cholesterol 126, triglycerides 54, HDL 59, LDL 56.  Cardiolite study normal without evidence of ischemia or scar.  Ejection fraction 62%.  Chest x-ray showed no acute abnormality.  A 12-lead EKG was without ischemic changes.  DISPOSITION:  The patient was discharged to home in the care of his wife on the following medications.  DISCHARGE MEDICATIONS: 1. Lipitor 80 q.d. 2. Singulair 10 p.o. q.d. 3. Albuterol MDI two puffs q.4-6h. p.r.n. 4. Protonix 40 mg p.o. q.d. 5. Prozac 40  mg p.o. q.d. 6. Allegra 180 p.o. q.d. 7. Wellbutrin 150 p.o. q.d. 8. Toprol-XL 25 p.o. q.d. 9. Aspirin 81 p.o. q.d.  ACTIVITY:  The patient is encouraged to walk 30 minutes a day.  DIET RECOMMENDED:  Heart healthy.  FOLLOW-UP:  He will follow up with Dr. Riley Kill as planned in six months time and knows to call in the interim for any change or increase in symptoms. Dictated by:   Pennelope Bracken, N.P. Attending:  Arturo Morton. Riley Kill, M.D. [facility] DD:  02/19/02 TD:   02/19/02 Job: [id number]

## 2011-01-20 NOTE — Cardiovascular Report (Signed)
[place]. [facility]  Patient:    Alexander Calderon, Alexander Calderon                      MRN: [id number] Proc. Date: 09/27/00 Attending:  Everardo Beals. Juanda Chance, M.D. [facility] CC:         Dorann Lodge, M.D.             Arturo Morton. Riley Kill, M.D. [facility]             Cardiopulmonary Lab                        Cardiac Catheterization  CARDIOLOGIST:  Everardo Beals. Juanda Chance, M.D. [facility]  CLINICAL HISTORY:  Mr. Edenfield is 50 years old and had previous stenting of the right coronary artery in November 2000.  This was a complicated procedure that resulted in distal embolization at the time of stent deployment.  The patient subsequently did well and was seen in the office yesterday with recent symptoms of recurrent chest pain which he said is similar to what he had prior to his stent implantation.  PROCEDURE: 1. Heart catheterization. 2. Intravascular ultrasound. 3. Stent implantation.  DESCRIPTION OF PROCEDURE:  The procedure was performed by the right femoral artery and arterial sheath and 6-French preformed coronary catheters.  An arterial punch was performed, and Omnipaque contrast was used.  At the completion of the diagnostic study, we made the decision to perform IVUS to decide regarding intervention on the restenosis at the edge of the stent in the right coronary artery.  We used a 3.2 Ultra cross catheter and passed the IVUS catheter distal to the stent and pulled the IVUS back through the stent with automatic pullback.   We then made the decision to proceed with intervention on the right coronary artery.  We used a 3.5 x 15 mm NIR Elite stent and deployed this with one inflation of 12 atmospheres for 30 seconds. We then pulse dilated with a 4.0 x 15 mm noncompliant Ranger performing a total of five inflations up to 17 atmospheres for 24 seconds.  Repeat diagnostic study was then performed through the guiding catheter.  The patient tolerated the procedure well and left the laboratory in  satisfactory condition.  RESULTS:  Initially the stenosis at the distal edge of the stent in the mid right coronary artery extending just outside the stent was estimated at 70%.  By IVUS measurements, the lumen area at the lesion was 2.7 mm squared with a vessel area of 9.8 mm squared given a percent area of stenosis of 72%.  The reference lumen diameter was 3.5.  Following stenting of the distal edge stenosis, the stenosis improved from 70% to less than 10%.  Left main coronary artery is free of significant disease.  Left anterior descending artery gave rise to three diagonal branches and two sets of perforators.  The circumflex artery gave rise to a marginal branch and two posterolateral branches.  These vessels were free of significant disease.  The right coronary artery was a moderately large vessel and gave rise to three right ventricular branches, a posterior descending branch and a small posterolateral branch.  The stent in the proximal coronary artery had mild narrowing estimated at 30%, and there was focal narrowing at the distal edge of the stent extending outside of the stent which was estimated at 70%.  The IVUS measurements outlined above indicated 72% area of stenosis.  Following stenting of the stenosis  at the distal edge of the stent, the stenosis improved from 90% to 10%.  DISPOSITION:  The patient is admitted for observation.  We are not certain that the lesion was tight enough to cause his symptoms, but we felt that it might be and felt that treating it was the best option.  With the balloon inflations, he had cheek pain as his predominant symptom.  There was also some chest and arm pain. DD:  09/27/00 TD:  09/27/00 Job: [id number]

## 2011-01-20 NOTE — Cardiovascular Report (Signed)
[place]. [facility]  Patient:    DEANDRA GADSON                        MRN: [id number] Proc. Date: 07/11/99 Adm. Date:  [id number] Attending:  Ronaldo Miyamoto CC:         Arturo Morton. Riley Kill, M.D. [facility]             Bruce R. Juanda Chance, M.D. [facility]                        Cardiac Catheterization  INDICATIONS:  Mr. Ortez is 50 years old and has had multiple episodes of chest pain, one of which was associated with positive MBs and nonsustained ventricular tachycardia.  He had repeat angiogram last week, which showed a lesion in the right coronary artery which did not appear tight, but did appear somewhat hypodense and after reviewing the films, we felt that we should perform IVUS to further assess the lesion.  This was scheduled for today after being on heparin over the weekend.  DESCRIPTION OF PROCEDURE:  The procedure was performed via the left femoral artery using arterial sheath and 7-French JR-4 guiding catheter with sideholes.  We used a short floppy wire.  The patient was given weight-adjusted heparin to prolong the ACT to greater than 200 seconds.  We used an Atlantis catheter and performed automatic pullback.  This demonstrated the lesion to be fairly tight with dimensions of 2.0 mm x 1.5 mm and an area of 3.4 mm sq.  The reference vessel was 4.0 x 4.0.  Based on these findings, we felt intervention was indicated.  The patient was given double bolus Integrilin and infusion and we direct stented with a 25 mm x 4.0 mm NIR Royale stent.  We deployed this initially with one inflation of 9 atm x 30 seconds.  This resulted in a distal embolization with a lucency in the distal right coronary artery before the posterior descending branch.  We gave intracoronary nitroglycerin and Verapamil and waited for a period of time and then elected to post dilate the stent with a 4.0 x 22 mm Quantum Ranger and performed one inflation of 15 atm x 30 seconds.  Repeat  diagnostic study showed that the distal clot had embolized further down to the posterior descending artery causing a cutoff.  The patient had increased pain and ST elevation with this.  We went down with a 2.0 CrossSail and dotted the lesion and amazingly this completely cleared up the lesion with good flow down the vessel.  We never lost TIMI-3 flow. However, following this, there appeared a new lucency proximal to the stent.  This did not appear to be a dissection and this area of the vessel was fairly clear of plaque on the initial IVUS run.  We felt that repeat IVUS to exclude a dissection would risk distal embolization and also that repeat balloon dilatation would risk distal embolization.  We felt the best course was to continue with the Integrilin.  The patient had quite a bit of chest pain during the procedure and required about 10 mg of morphine and 4 mg of Versed.  He had multiple doses of intracoronary nitroglycerin and Verapamil.  At the end of the procedure, he still had mild pain and about 0.5 mm to 1 mm ST elevation.  He was returned to the intensive care unit in stable condition.  CONCLUSION: 1. There was  75% stenosis in the proximal right coronary artery documented by    intravascular ultrasound. 2. Successful stent deployment in the proximal right coronary artery with    improvement in the percent of diameter narrowing from 75% to less than 10%,    but with distal embolization resulting in ST segment elevation and severe pain    with some residual thrombus in the proximal right coronary artery.  DISPOSITION:  The patient certainly has had a periprocedural myocardial infarction and will need to be observed in the hospital for a few days.  We will plan to continue the Integrilin with the hopes that this will clear the residual thrombus located in the proximal right coronary artery. DD:  07/11/99 TD:  07/12/99 Job: [id number]

## 2011-01-20 NOTE — Discharge Summary (Signed)
[place]. [facility]  Patient:    Alexander Calderon, Alexander Calderon                         MRN: [id number] Adm. Date:  09/26/00 Disc. Date: 09/30/00 Attending:  Arturo Morton. Riley Kill, M.D. [facility] Dictator:   Tereso Newcomer, P.A. CC:         Loyal Jacobson, M.D.   Discharge Summary  DATE OF BIRTH:  1961/03/15  DISCHARGE DIAGNOSES:  1. Coronary artery disease.  2. Status post stent placement to distal right coronary artery on September 27, 2000.  3. History of non-Q-wave myocardial infarction treated with stent to RCA,     November of 2000 complicated by distal embolization.  4. Normal stress Cardiolite, February of 2001.  5. Normal left ventricular function.  6. Dyslipidemia.  7. Hypertension.  8. Asthma.  9. Family history of premature coronary artery disease.  PROCEDURES PERFORMED THIS ADMISSION:  Cardiac catheterization, percutaneous coronary intervention by Bruce R. Juanda Chance, M.D. on September 27, 2000, revealing LAD and circumflex okay, RCA 70% at distal stent edge, LV normal. Stent placed to the RCA at distal stent with reduction of stenosis 70% to less than 10%. Normal LV.  ADMISSION HISTORY OF PRESENT ILLNESS:  This 49 year old male was seen in the office on September 26, 2000. He has a history of non-Q-wave myocardial infarction in November of 2000 treated with stenting of the mid RCA. The procedure was complicated by distal embolization and thrombus. He was placed on antiplatelet agents and brought back for relook study revealing widely patent stent, residual filling defect in proximal RCA which was felt to represent residual thrombus by IVUS. LV function was normal. However, the patient had recurrent chest pain and was brought back for another study 10 days later. This revealed near-complete resolution of the proximal RCA thrombus with continued patency of the stent site. Residual anatomy notable for up to 20 to 30% ostial left main coronary artery, multiple  luminal irregularities of the LAD, and normal circumflex. On September 26, 2000, he presented to the office for recurrent angina. He had been doing quite well until recently. In December, he noted redevelopment of symptoms similar to his non-Q-wave MI presentation. He described sharp and quite fleeting chest pain associated with heavy lifting. He also noted some chest pressure which has persisted after the sharp episodes. He noted some increasing frequency and noted some chest discomfort on the morning of initial evaluation after awakening. He had some relief with nitroglycerin spray, but the symptoms returned several hours after use. A 12-lead EKG in the office revealed normal sinus rhythm, 73 beats per minute, normal axis, no ischemic changes.  ALLERGIES:  DEXTROMETHORPHAN.  INITIAL PHYSICAL EXAMINATION:  GENERAL:  A well-developed, well-nourished male in no acute distress.  VITAL SIGNS:  Blood pressure 120/76, pulse 70s and regular, weight 140 pounds.  NECK:  No JVD. No bruits.  LUNGS:  Clear to auscultation bilaterally.  HEART:  Regular rate and rhythm S1/S2. No murmurs or rubs. Positive S4.  ABDOMEN:  Soft, nontender.  EXTREMITIES:  Intact pulses. No edema.  HOSPITAL COURSE:  The patient was admitted to [facility] for unstable angina pectoris. He was placed on IV heparin. He was continued on aspirin and beta blocker. His cardiac enzymes returned negative. On September 27, 2000, the patient underwent cardiac catheterization by Bruce R. Juanda Chance, M.D. as noted above. He tolerated the procedure well. On September 28, 2000, the  patient was still complaining of some mild chest pain. It was noted in the past that the patient had had similar symptoms and was readmitted shortly after being discharged for his last procedure. His troponin I was also noted to be elevated at 0.25. Hemoglobin was 12.8 and hematocrit was 39.9. It was decided to keep the patient 1 more day to reevaluate  his symptoms. On September 29, 2000, he was still complaining of more chest pain. His CK-MBs remained negative. Due to his similar symptoms in the past, it was decided again to keep the patient 1 more day. Arturo Morton. Riley Kill, M.D. saw the patient and noted that he may need a repeat RCA angiography with a 5-French catheter but hopefully not. On morning of September 30, 2000, the patient still describes some nonexertional symptoms. Noralyn Pick. Eden Emms, M.D. saw the patient and discussed the case with Arturo Morton. Riley Kill, M.D. Arturo Morton. Riley Kill, M.D. agreed that the patient could be discharged to home. He is to remain on aspirin and Plavix, and he is to follow up with the P.A. in 1 week and Maisie Fus D. Stuckey, M.D. in 6 to 8 weeks.  OTHER LABORATORY AND ANCILLARY DATA:  Hemoglobin 12, hematocrit 35.3 prior to discharge. Sodium 135, potassium 3.8, chloride 100, CO2 31, glucose 105, BUN 19, creatinine 1.1, calcium 8.7, alkaline phosphatase 72, SGOT 21, SGPT 21, total protein 6.1, albumin 3.8. Lipid profile:  Total cholesterol 177, triglycerides 74, HDL 55, LDL 107. TSH 1.028.  DISCHARGE MEDICATIONS:  1. Plavix 75 mg q.d. for 1 month.  2. Enteric-coated aspirin 325 mg q.d.  3. Allegra.  4. Flovent inhaler p.r.n.  5. Prozac 40 mg q.d.  6. Toprol XL 25 mg q.d.  7. Lipitor 60 mg q.d.  8. Wellbutrin.  9. Nasacort AQ q.d. 10. Tricor 160 mg q.d.  ACTIVITIES:  No driving or heavy lifting for 2 days.  DIET:  Low-fat, low-cholesterol diet.  INSTRUCTIONS:  The patient is to call our office if his groin swells or bleeds.  FOLLOW-UP:  He is to see the interventional P.A. next week and follow-up appointment with Arturo Morton. Stuckey, M.D. in 6 to 8 weeks. He should call our office for an appointment. DD:  09/30/00 TD:  09/30/00 Job: [id number]

## 2011-02-21 ENCOUNTER — Encounter: Payer: Self-pay | Admitting: Cardiology

## 2011-02-22 ENCOUNTER — Encounter: Payer: Self-pay | Admitting: Cardiology

## 2011-02-22 ENCOUNTER — Ambulatory Visit (INDEPENDENT_AMBULATORY_CARE_PROVIDER_SITE_OTHER): Payer: Federal, State, Local not specified - PPO | Admitting: Cardiology

## 2011-02-22 VITALS — BP 117/78 | HR 75 | Ht 65.0 in | Wt 167.0 lb

## 2011-02-22 DIAGNOSIS — E785 Hyperlipidemia, unspecified: Secondary | ICD-10-CM

## 2011-02-22 DIAGNOSIS — I10 Essential (primary) hypertension: Secondary | ICD-10-CM

## 2011-02-22 DIAGNOSIS — I2510 Atherosclerotic heart disease of native coronary artery without angina pectoris: Secondary | ICD-10-CM

## 2011-02-22 NOTE — Patient Instructions (Signed)
Your physician wants you to follow-up in: 12 months with Dr. Riley Kill. You will receive a reminder letter in the mail two months in advance. If you don't receive a letter, please call our office to schedule the follow-up appointment.

## 2011-03-08 ENCOUNTER — Encounter: Payer: Self-pay | Admitting: Cardiology

## 2011-03-08 NOTE — Assessment & Plan Note (Signed)
Last lipid about a year ago here.  Gets them done with Dr. Leonette Most.

## 2011-03-08 NOTE — Assessment & Plan Note (Signed)
Currently well controlled on medical therapy.

## 2011-03-08 NOTE — Progress Notes (Signed)
HPI:  He seems to be getting along just fine at this point in time. He has no specific complaints.  Denies current chest pain. We discussed some of his habits in great detail.   Current Outpatient Prescriptions  Medication Sig Dispense Refill  . aspirin 81 MG tablet Take 81 mg by mouth daily.        Marland Kitchen buPROPion (WELLBUTRIN XL) 300 MG 24 hr tablet Take 300 mg by mouth daily.        Marland Kitchen esomeprazole (NEXIUM) 40 MG capsule Take 40 mg by mouth daily before breakfast.        . ezetimibe (ZETIA) 10 MG tablet Take 10 mg by mouth daily.        . ferrous sulfate 325 (65 FE) MG tablet Take 325 mg by mouth daily with breakfast.        . FLUoxetine (PROZAC) 40 MG capsule Take 40 mg by mouth 2 (two) times daily.        . metoprolol (TOPROL-XL) 50 MG 24 hr tablet Irrigate with 1 tablet as directed Daily.      . mirtazapine (REMERON) 45 MG tablet Take 1 tablet by mouth Daily.        . rosuvastatin (CRESTOR) 40 MG tablet Take 40 mg by mouth daily.        Marland Kitchen SINGULAIR 10 MG tablet as needed.        Allergies  Allergen Reactions  . Dextrans     Past Medical History  Diagnosis Date  . Coronary artery disease     status post PCI of the RCA in 2000 with subsequent PCI to the the RCA in 2002 due to  the angina and in - stent restenosis by IVUS.   Marland Kitchen Hypertension   . Asthma   . Depression     Past Surgical History  Procedure Date  . Cardiac catheterization     Family History  Problem Relation Age of Onset  . Heart attack Father     age 20  . Coronary artery disease Brother     on set  CAD age 52    History   Social History  . Marital Status: Single    Spouse Name: N/A    Number of Children: N/A  . Years of Education: N/A   Occupational History  . Not on file.   Social History Main Topics  . Smoking status: Never Smoker   . Smokeless tobacco: Not on file  . Alcohol Use: Not on file  . Drug Use: Not on file  . Sexually Active: Not on file   Other Topics Concern  . Not on file    Social History Narrative   [place] . He works at the [employer]. He denies tobacco use, drinks alcohol 2-3 times per week, and uses energy supplements occasionally    ROS: Please see the HPI.  All other systems reviewed and negative.  PHYSICAL EXAM:  BP 117/78  Pulse 75  Ht 5\' 5"  (1.651 m)  Wt 167 lb (75.751 kg)  BMI 27.79 kg/m2  General: Well developed, well nourished, in no acute distress. Head:  Normocephalic and atraumatic. Neck: no JVD Lungs: Clear to auscultation and percussion. Heart: Normal S1 and S2.  No murmur, rubs or gallops.  Abdomen:  Normal bowel sounds; soft; non tender; no organomegaly Pulses: Pulses normal in all 4 extremities. Extremities: No clubbing or cyanosis. No edema. Neurologic: Alert and oriented x 3.  EKG:  NSR.  WNL  ASSESSMENT AND PLAN:

## 2011-03-08 NOTE — Assessment & Plan Note (Signed)
Currently stable.  No new symptoms at present.

## 2011-06-15 LAB — BASIC METABOLIC PANEL
CO2: 27
Chloride: 103
Creatinine, Ser: 1.04
GFR calc Af Amer: >60
Potassium: 4.9
Sodium: 137

## 2011-06-15 LAB — DIFFERENTIAL
Basophils Relative: 0
Eosinophils Absolute: 0.1
Lymphs Abs: 1.2
Monocytes Absolute: 0.3
Monocytes Relative: 7
Neutrophils Relative %: 67

## 2011-06-15 LAB — CBC
HCT: 41.6
Hemoglobin: 13.9
MCHC: 33.4
MCV: 80.9
RBC: 5.14
WBC: 5.1

## 2011-09-06 IMAGING — CR DG CHEST 1V PORT
1 series · 1 of 1 positions shown · non-contrast
Comparison: None

CLINICAL DATA: Chest pain, tightness for 2 hours.  Shortness of
breath.  History of MI.

PORTABLE CHEST - 1 VIEW

[view not recorded]
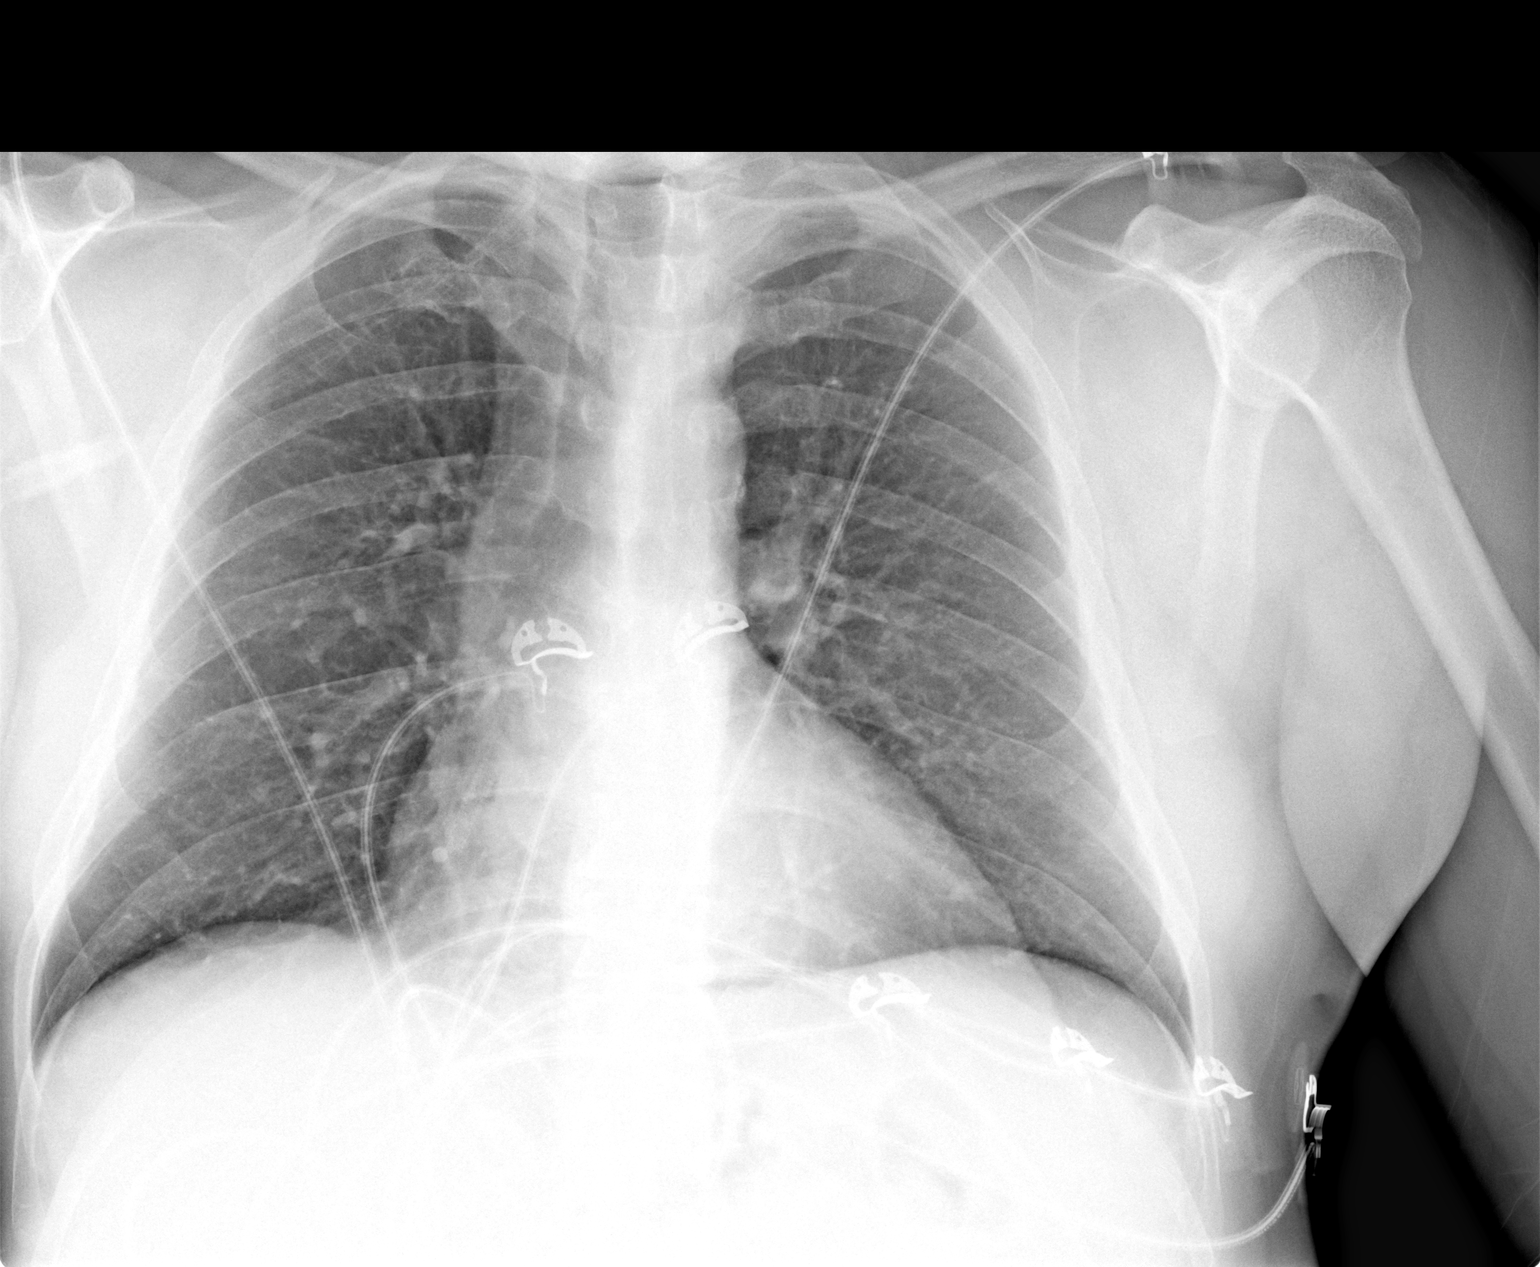

[1 of 1 positions shown; findings below may reference images not displayed]

FINDINGS: The heart is enlarged.  There is perihilar bronchitic
change and prominence of interstitial markings.  No evidence for
overt edema.  No focal consolidations or pleural effusions are
identified.
IMPRESSION: 1.  Cardiomegaly.
2.  Bronchitic changes.

## 2012-03-12 ENCOUNTER — Ambulatory Visit (INDEPENDENT_AMBULATORY_CARE_PROVIDER_SITE_OTHER): Payer: Federal, State, Local not specified - PPO | Admitting: Cardiology

## 2012-03-12 ENCOUNTER — Encounter: Payer: Self-pay | Admitting: Cardiology

## 2012-03-12 VITALS — BP 132/64 | HR 71 | Ht 65.0 in | Wt 170.0 lb

## 2012-03-12 DIAGNOSIS — I10 Essential (primary) hypertension: Secondary | ICD-10-CM

## 2012-03-12 DIAGNOSIS — E785 Hyperlipidemia, unspecified: Secondary | ICD-10-CM

## 2012-03-12 DIAGNOSIS — I2510 Atherosclerotic heart disease of native coronary artery without angina pectoris: Secondary | ICD-10-CM

## 2012-03-12 NOTE — Progress Notes (Signed)
   HPI:  The patient is in for followup. In general he is doing really quite well.  He does not have a specific complaints. He denies chest pain. His last catheterization was in 2011, and at that time he had balloon angioplasty only of the distal circumflex. His right coronary artery stent was widely patent.  Current Outpatient Prescriptions  Medication Sig Dispense Refill  . aspirin 81 MG tablet Take 81 mg by mouth daily.        Marland Kitchen buPROPion (WELLBUTRIN XL) 300 MG 24 hr tablet Take 300 mg by mouth daily.        Marland Kitchen esomeprazole (NEXIUM) 40 MG capsule Take 40 mg by mouth daily before breakfast.        . ezetimibe (ZETIA) 10 MG tablet Take 10 mg by mouth daily.        Marland Kitchen FLUoxetine (PROZAC) 40 MG capsule Take 40 mg by mouth 2 (two) times daily.        . metoprolol (TOPROL-XL) 50 MG 24 hr tablet Irrigate with 1 tablet as directed Daily.      . rosuvastatin (CRESTOR) 40 MG tablet Take 40 mg by mouth daily.        Marland Kitchen SINGULAIR 10 MG tablet as needed.        Allergies  Allergen Reactions  . Dextrans     Past Medical History  Diagnosis Date  . Coronary artery disease     status post PCI of the RCA in 2000 with subsequent PCI to the the RCA in 2002 due to  the angina and in - stent restenosis by IVUS.   Marland Kitchen Hypertension   . Asthma   . Depression     Past Surgical History  Procedure Date  . Cardiac catheterization     Family History  Problem Relation Age of Onset  . Heart attack Father     age 104  . Coronary artery disease Brother     on set  CAD age 13    History   Social History  . Marital Status: Single    Spouse Name: N/A    Number of Children: N/A  . Years of Education: N/A   Occupational History  . Not on file.   Social History Main Topics  . Smoking status: Never Smoker   . Smokeless tobacco: Not on file  . Alcohol Use: Not on file  . Drug Use: Not on file  . Sexually Active: Not on file   Other Topics Concern  . Not on file   Social History Narrative   [place] . He works at the [employer]. He denies tobacco use, drinks alcohol 2-3 times per week, and uses energy supplements occasionally    ROS: Please see the HPI.  All other systems reviewed and negative.  PHYSICAL EXAM:  BP 132/64  Pulse 71  Ht 5\' 5"  (1.651 m)  Wt 170 lb (77.111 kg)  BMI 28.29 kg/m2  General: Well developed, well nourished, in no acute distress. Head:  Normocephalic and atraumatic. Neck: no JVD Lungs: Clear to auscultation and percussion. Heart: Normal S1 and S2.  No murmur, rubs or gallops.  Pulses: Pulses normal in all 4 extremities. Extremities: No clubbing or cyanosis. No edema. Neurologic: Alert and oriented x 3.  EKG:  NSR.  WNL.   ASSESSMENT AND PLAN:

## 2012-03-12 NOTE — Patient Instructions (Signed)
Your physician wants you to follow-up in: 1 YEAR with Dr McAlhany.  You will receive a reminder letter in the mail two months in advance. If you don't receive a letter, please call our office to schedule the follow-up appointment.  Your physician recommends that you continue on your current medications as directed. Please refer to the Current Medication list given to you today.  

## 2012-03-13 ENCOUNTER — Encounter: Payer: Self-pay | Admitting: Cardiology

## 2012-03-13 NOTE — Telephone Encounter (Signed)
This encounter was created in error - please disregard.

## 2012-03-13 NOTE — Telephone Encounter (Signed)
Patient returning nurse call, he can be reached at [phone].

## 2012-03-14 NOTE — Assessment & Plan Note (Signed)
Controlled.  

## 2012-03-14 NOTE — Assessment & Plan Note (Signed)
Followed by Dr. Leonette Most

## 2012-03-14 NOTE — Assessment & Plan Note (Signed)
Continues to remain stable 

## 2013-03-17 ENCOUNTER — Ambulatory Visit: Payer: Federal, State, Local not specified - PPO | Admitting: Cardiovascular Disease

## 2013-05-27 ENCOUNTER — Encounter: Payer: Self-pay | Admitting: Cardiovascular Disease

## 2013-05-27 ENCOUNTER — Ambulatory Visit (INDEPENDENT_AMBULATORY_CARE_PROVIDER_SITE_OTHER): Payer: Federal, State, Local not specified - PPO | Admitting: Cardiovascular Disease

## 2013-05-27 VITALS — BP 148/89 | HR 81 | Ht 65.0 in | Wt 157.0 lb

## 2013-05-27 DIAGNOSIS — I2510 Atherosclerotic heart disease of native coronary artery without angina pectoris: Secondary | ICD-10-CM

## 2013-05-27 DIAGNOSIS — E785 Hyperlipidemia, unspecified: Secondary | ICD-10-CM

## 2013-05-27 DIAGNOSIS — I10 Essential (primary) hypertension: Secondary | ICD-10-CM

## 2013-05-27 NOTE — Progress Notes (Signed)
History of Present Illness: 52 yo male with history of CAD, HTN, asthma here today for cardiac follow up. He has been followed in the past by Dr. Riley Kill. His CAD dates back to 2000 when he had a bare metal stent placed in the RCA. Next cath 2002 with another bare metal stent RCA. Last cath 2011 with PTCA of small distal Circumflex. Last echo 2011 with LVEF=55-60%, mild LVH.   He is here today for follow up. No chest pain or SOB. No near syncope, syncope or dizziness.   Primary Care Physician: Leonette Most  Last Lipid Profile: Followed in primary care.    Past Medical History  Diagnosis Date  . Coronary artery disease     status post PCI of the RCA in 2000 with subsequent PCI to the the RCA in 2002 due to  the angina and in - stent restenosis by IVUS.   Marland Kitchen Hypertension   . Asthma   . Depression     Past Surgical History  Procedure Laterality Date  . Appendectomy      Current Outpatient Prescriptions  Medication Sig Dispense Refill  . albuterol (PROVENTIL HFA;VENTOLIN HFA) 108 (90 BASE) MCG/ACT inhaler Inhale 2 puffs into the lungs every 6 (six) hours as needed for wheezing.      Marland Kitchen aspirin 81 MG tablet Take 81 mg by mouth daily.        Marland Kitchen buPROPion (WELLBUTRIN XL) 300 MG 24 hr tablet Take 300 mg by mouth daily.        Marland Kitchen dicyclomine (BENTYL) 10 MG capsule Take 10 mg by mouth 4 (four) times daily -  before meals and at bedtime.      Marland Kitchen EPINEPHrine (EPIPEN) 0.3 mg/0.3 mL SOAJ injection Inject 0.3 mg into the muscle as directed.      . ezetimibe (ZETIA) 10 MG tablet Take 10 mg by mouth daily.        Marland Kitchen FLUoxetine (PROZAC) 40 MG capsule Take 40 mg by mouth 2 (two) times daily.        Marland Kitchen LORazepam (ATIVAN) 1 MG tablet Take 1 mg by mouth every 8 (eight) hours as needed for anxiety.      . metFORMIN (GLUCOPHAGE) 500 MG tablet Take 500 mg by mouth 2 (two) times daily with a meal.      . metoprolol (TOPROL-XL) 50 MG 24 hr tablet Irrigate with 1 tablet as directed daily.       . nitroGLYCERIN  (NITROSTAT) 0.4 MG SL tablet Place 0.4 mg under the tongue every 5 (five) minutes as needed for chest pain.      Marland Kitchen omeprazole (PRILOSEC) 20 MG capsule Take 20 mg by mouth daily.      . rosuvastatin (CRESTOR) 40 MG tablet Take 40 mg by mouth daily.        Marland Kitchen SINGULAIR 10 MG tablet Take 10 mg by mouth daily.        No current facility-administered medications for this visit.    Allergies  Allergen Reactions  . Dextrans     History   Social History  . Marital Status: Single    Spouse Name: N/A    Number of Children: 2  . Years of Education: N/A   Occupational History  .  [employer]   Social History Main Topics  . Smoking status: Never Smoker   . Smokeless tobacco: Never Used  . Alcohol Use: 0.5 oz/week    1 drink(s) per week  . Drug Use: No  .  Sexual Activity: Not on file   Other Topics Concern  . Not on file   Social History Narrative   [place] . He works at the post office. He denies tobacco use, drinks alcohol 2-3 times per week, and uses energy supplements occasionally    Family History  Problem Relation Age of Onset  . Heart attack Father     age 35  . Coronary artery disease Brother     onset  CAD age 36    Review of Systems:  As stated in the HPI and otherwise negative.   BP 148/89  Pulse 81  Ht 5\' 5"  (1.651 m)  Wt 157 lb (71.215 kg)  BMI 26.13 kg/m2  Physical Examination: General: Well developed, well nourished, NAD HEENT: OP clear, mucus membranes moist SKIN: warm, dry. No rashes. Neuro: No focal deficits Musculoskeletal: Muscle strength 5/5 all ext Psychiatric: Mood and affect normal Neck: No JVD, no carotid bruits, no thyromegaly, no lymphadenopathy. Lungs:Clear bilaterally, no wheezes, rhonci, crackles Cardiovascular: Regular rate and rhythm. No murmurs, gallops or rubs. Abdomen:Soft. Bowel sounds present. Non-tender.  Extremities: No lower extremity edema. Pulses are 2 + in the bilateral  DP/PT.  EKG: NSR, rate 81 bpm. Normal EKG.   Echo 04/17/10: Left ventricle: The cavity size was normal. Wall thickness was increased in a pattern of mild LVH. Systolic function was normal. The estimated ejection fraction was in the range of 55% to 60%. Wall motion was normal; there were no regional wall motion abnormalities. - Aortic valve: Mildly calcified annulus. Trileaflet; normal thickness leaflets.  Assessment and Plan:   1. CAD: Stable. No recent ischemic testing. Will arrange treadmill stress test to exclude ischemia. Echocardiogram to assess LV size and function, assess valves. Continue ASA, statin, beta blocker.   2. HTN: BP controlled. Continue current medications. No changes.   3. HLD: He is on a statin. Lipids followed in primary care and well controlled per patient.   Extensive record review today x 30 minutes reviewing cath reports from 2000, 2002, 2011, echo from 2011. EKG reviewed today.

## 2013-05-27 NOTE — Patient Instructions (Addendum)
Your physician wants you to follow-up in:  12 months.  You will receive a reminder letter in the mail two months in advance. If you don't receive a letter, please call our office to schedule the follow-up appointment.  Your physician has requested that you have an exercise tolerance test. Can be done with NP or PA.  For further information please visit [email or web address] Please also follow instruction sheet, as given.  Your physician has requested that you have an echocardiogram. Echocardiography is a painless test that uses sound waves to create images of your heart. It provides your doctor with information about the size and shape of your heart and how well your heart's chambers and valves are working. This procedure takes approximately one hour. There are no restrictions for this procedure.

## 2013-06-10 ENCOUNTER — Ambulatory Visit (HOSPITAL_COMMUNITY): Payer: Federal, State, Local not specified - PPO | Attending: Cardiovascular Disease | Admitting: Radiology

## 2013-06-10 ENCOUNTER — Other Ambulatory Visit (HOSPITAL_COMMUNITY): Payer: Self-pay | Admitting: Cardiovascular Disease

## 2013-06-10 DIAGNOSIS — E785 Hyperlipidemia, unspecified: Secondary | ICD-10-CM | POA: Insufficient documentation

## 2013-06-10 DIAGNOSIS — I10 Essential (primary) hypertension: Secondary | ICD-10-CM | POA: Insufficient documentation

## 2013-06-10 DIAGNOSIS — I2510 Atherosclerotic heart disease of native coronary artery without angina pectoris: Secondary | ICD-10-CM

## 2013-06-10 DIAGNOSIS — I079 Rheumatic tricuspid valve disease, unspecified: Secondary | ICD-10-CM | POA: Insufficient documentation

## 2013-06-10 NOTE — Progress Notes (Signed)
Echocardiogram performed.  

## 2013-06-13 ENCOUNTER — Telehealth: Payer: Self-pay | Admitting: Cardiovascular Disease

## 2013-06-13 NOTE — Telephone Encounter (Deleted)
error 

## 2013-06-13 NOTE — Telephone Encounter (Deleted)
New   Call pt and LVM about rescheduling

## 2013-07-02 ENCOUNTER — Encounter: Payer: Federal, State, Local not specified - PPO | Admitting: Physician Assistant

## 2013-07-23 ENCOUNTER — Ambulatory Visit (INDEPENDENT_AMBULATORY_CARE_PROVIDER_SITE_OTHER): Payer: Federal, State, Local not specified - PPO | Admitting: Physician Assistant

## 2013-07-23 DIAGNOSIS — I2510 Atherosclerotic heart disease of native coronary artery without angina pectoris: Secondary | ICD-10-CM

## 2013-07-23 NOTE — Progress Notes (Signed)
Exercise Treadmill Test  Pre-Exercise Testing Evaluation Rhythm: normal sinus  Rate: 74 bpm     Test  Exercise Tolerance Test Ordering MD: Melene Muller, MD  Interpreting MD: Tereso Newcomer, PA-C  Unique Test No: 1  Treadmill:  1  Indication for ETT: known ASHD  Contraindication to ETT: No   Stress Modality: exercise - treadmill  Cardiac Imaging Performed: non   Protocol: standard Bruce - maximal  Max BP:  149/66  Max MPHR (bpm):  170 85% MPR (bpm):  145  MPHR obtained (bpm):  120 % MPHR obtained:  70  Reached 85% MPHR (min:sec):  n/a Total Exercise Time (min-sec):  9:47  Workload in METS:  11.3 Borg Scale: 17  Reason ETT Terminated:  fatigue    ST Segment Analysis At Rest: normal ST segments - no evidence of significant ST depression With Exercise: non-specific ST changes  Other Information Arrhythmia:  No Angina during ETT:  absent (0) Quality of ETT:  non-diagnostic  ETT Interpretation:  No significant ST depression at sub-maximal exercise.  Comments: Good exercise capacity. No chest pain. Normal BP response to exercise. No ST changes to suggest ischemia at submaximal exercise.   Recommendations: Patient with good exercise tolerance and no ST changes at inadequate exercise. He did take his beta blocker today. ETT done for routine purposes. Will defer whether to pursue nuclear study to his primary cardiologist (Dr. Verne Carrow). Signed,  Tereso Newcomer, PA-C   07/23/2013 11:27 AM

## 2013-11-16 ENCOUNTER — Inpatient Hospital Stay (HOSPITAL_COMMUNITY)
Admission: EM | Admit: 2013-11-16 | Discharge: 2013-11-18 | DRG: 247 | Disposition: A | Discharge status: Home / Self Care | Payer: Federal, State, Local not specified - PPO | Attending: Cardiovascular Disease | Admitting: Cardiovascular Disease

## 2013-11-16 ENCOUNTER — Encounter (HOSPITAL_COMMUNITY): Payer: Self-pay | Admitting: Cardiology

## 2013-11-16 ENCOUNTER — Emergency Department (HOSPITAL_COMMUNITY): Payer: Federal, State, Local not specified - PPO

## 2013-11-16 DIAGNOSIS — F3289 Other specified depressive episodes: Secondary | ICD-10-CM | POA: Diagnosis present

## 2013-11-16 DIAGNOSIS — E119 Type 2 diabetes mellitus without complications: Secondary | ICD-10-CM | POA: Diagnosis present

## 2013-11-16 DIAGNOSIS — J45909 Unspecified asthma, uncomplicated: Secondary | ICD-10-CM | POA: Diagnosis present

## 2013-11-16 DIAGNOSIS — Z79899 Other long term (current) drug therapy: Secondary | ICD-10-CM

## 2013-11-16 DIAGNOSIS — I10 Essential (primary) hypertension: Secondary | ICD-10-CM | POA: Diagnosis present

## 2013-11-16 DIAGNOSIS — I200 Unstable angina: Secondary | ICD-10-CM | POA: Diagnosis present

## 2013-11-16 DIAGNOSIS — Z9861 Coronary angioplasty status: Secondary | ICD-10-CM

## 2013-11-16 DIAGNOSIS — Z8249 Family history of ischemic heart disease and other diseases of the circulatory system: Secondary | ICD-10-CM

## 2013-11-16 DIAGNOSIS — I2510 Atherosclerotic heart disease of native coronary artery without angina pectoris: Secondary | ICD-10-CM | POA: Diagnosis present

## 2013-11-16 DIAGNOSIS — Z7982 Long term (current) use of aspirin: Secondary | ICD-10-CM

## 2013-11-16 DIAGNOSIS — F32A Depression, unspecified: Secondary | ICD-10-CM | POA: Insufficient documentation

## 2013-11-16 DIAGNOSIS — E785 Hyperlipidemia, unspecified: Secondary | ICD-10-CM | POA: Diagnosis present

## 2013-11-16 DIAGNOSIS — K219 Gastro-esophageal reflux disease without esophagitis: Secondary | ICD-10-CM | POA: Diagnosis present

## 2013-11-16 DIAGNOSIS — F329 Major depressive disorder, single episode, unspecified: Secondary | ICD-10-CM | POA: Diagnosis present

## 2013-11-16 HISTORY — DX: Hyperlipidemia, unspecified: E78.5

## 2013-11-16 HISTORY — DX: Gastro-esophageal reflux disease without esophagitis: K21.9

## 2013-11-16 HISTORY — DX: Type 2 diabetes mellitus without complications: E11.9

## 2013-11-16 LAB — CBC WITH DIFFERENTIAL/PLATELET
Basophils Absolute: 0 10*3/uL (ref 0.0–0.1)
Basophils Relative: 0 % (ref 0–1)
EOS PCT: 2 % (ref 0–5)
Eosinophils Absolute: 0.1 10*3/uL (ref 0.0–0.7)
HEMATOCRIT: 40.5 % (ref 39.0–52.0)
Hemoglobin: 13.9 g/dL (ref 13.0–17.0)
LYMPHS ABS: 1.5 10*3/uL (ref 0.7–4.0)
LYMPHS PCT: 24 % (ref 12–46)
MCH: 28.6 pg (ref 26.0–34.0)
MCHC: 34.3 g/dL (ref 30.0–36.0)
MCV: 83.3 fL (ref 78.0–100.0)
MONO ABS: 0.4 10*3/uL (ref 0.1–1.0)
Monocytes Relative: 7 % (ref 3–12)
Neutro Abs: 4.1 10*3/uL (ref 1.7–7.7)
Neutrophils Relative %: 67 % (ref 43–77)
Platelets: 229 10*3/uL (ref 150–400)
RBC: 4.86 MIL/uL (ref 4.22–5.81)
RDW: 12.9 % (ref 11.5–15.5)
WBC: 6.2 10*3/uL (ref 4.0–10.5)

## 2013-11-16 LAB — PROTIME-INR
INR: 0.89 (ref 0.00–1.49)
INR: 0.98 (ref 0.00–1.49)
PROTHROMBIN TIME: 11.9 s (ref 11.6–15.2)
Prothrombin Time: 12.8 seconds (ref 11.6–15.2)

## 2013-11-16 LAB — BASIC METABOLIC PANEL
BUN: 18 mg/dL (ref 6–23)
CALCIUM: 10.1 mg/dL (ref 8.4–10.5)
CO2: 27 mEq/L (ref 19–32)
CREATININE: 0.8 mg/dL (ref 0.50–1.35)
Chloride: 100 mEq/L (ref 96–112)
GFR calc Af Amer: >90 mL/min (ref >90)
GFR calc non Af Amer: >90 mL/min (ref >90)
GLUCOSE: 127 mg/dL — AB (ref 70–99)
Potassium: 4.2 mEq/L (ref 3.7–5.3)
Sodium: 140 mEq/L (ref 137–147)

## 2013-11-16 LAB — GLUCOSE, CAPILLARY: Glucose-Capillary: 99 mg/dL (ref 70–99)

## 2013-11-16 LAB — TROPONIN I
Troponin I: <0.3 ng/mL (ref <0.30)
Troponin I: <0.3 ng/mL (ref <0.30)

## 2013-11-16 LAB — COMPREHENSIVE METABOLIC PANEL
ALT: 67 U/L — ABNORMAL HIGH (ref 0–53)
AST: 39 U/L — ABNORMAL HIGH (ref 0–37)
Albumin: 3.6 g/dL (ref 3.5–5.2)
Alkaline Phosphatase: 104 U/L (ref 39–117)
BUN: 16 mg/dL (ref 6–23)
CHLORIDE: 106 meq/L (ref 96–112)
CO2: 29 meq/L (ref 19–32)
CREATININE: 0.82 mg/dL (ref 0.50–1.35)
Calcium: 9.5 mg/dL (ref 8.4–10.5)
GFR calc Af Amer: >90 mL/min (ref >90)
GLUCOSE: 100 mg/dL — AB (ref 70–99)
Potassium: 4.5 mEq/L (ref 3.7–5.3)
Sodium: 146 mEq/L (ref 137–147)
Total Protein: 6.1 g/dL (ref 6.0–8.3)

## 2013-11-16 LAB — APTT
aPTT: 29 seconds (ref 24–37)
aPTT: 163 seconds — ABNORMAL HIGH (ref 24–37)

## 2013-11-16 MED ORDER — NITROGLYCERIN 0.4 MG SL SUBL: 0.4000 mg | SUBLINGUAL_TABLET | SUBLINGUAL | Status: DC | PRN

## 2013-11-16 MED ORDER — SODIUM CHLORIDE 0.9 % IV SOLN: 250.0000 mL | INTRAVENOUS | Status: DC | PRN

## 2013-11-16 MED ORDER — BUPROPION HCL ER (XL) 300 MG PO TB24
300.0000 mg | ORAL_TABLET | Freq: Every day | ORAL | Status: DC
  Administered 2013-11-17 – 2013-11-18 (×2): 300 mg via ORAL
  Filled 2013-11-16 (×2): qty 1

## 2013-11-16 MED ORDER — ALBUTEROL SULFATE HFA 108 (90 BASE) MCG/ACT IN AERS: 2.0000 | INHALATION_SPRAY | Freq: Four times a day (QID) | RESPIRATORY_TRACT | Status: DC | PRN

## 2013-11-16 MED ORDER — MONTELUKAST SODIUM 10 MG PO TABS
10.0000 mg | ORAL_TABLET | Freq: Every day | ORAL | Status: DC
  Administered 2013-11-17 – 2013-11-18 (×2): 10 mg via ORAL
  Filled 2013-11-16 (×2): qty 1

## 2013-11-16 MED ORDER — METOPROLOL SUCCINATE ER 50 MG PO TB24
50.0000 mg | ORAL_TABLET | Freq: Every day | ORAL | Status: DC
  Administered 2013-11-18: 10:00:00 50 mg via ORAL
  Filled 2013-11-16 (×2): qty 1

## 2013-11-16 MED ORDER — HEPARIN BOLUS VIA INFUSION
4000.0000 [IU] | Freq: Once | INTRAVENOUS | Status: AC
  Administered 2013-11-16: 4000 [IU] via INTRAVENOUS
  Filled 2013-11-16: qty 4000

## 2013-11-16 MED ORDER — LORAZEPAM 1 MG PO TABS: 1.0000 mg | ORAL_TABLET | Freq: Three times a day (TID) | ORAL | Status: DC | PRN

## 2013-11-16 MED ORDER — SODIUM CHLORIDE 0.9 % IV SOLN
1.0000 mL/kg/h | INTRAVENOUS | Status: DC
  Administered 2013-11-17: 1 mL/kg/h via INTRAVENOUS

## 2013-11-16 MED ORDER — ASPIRIN 81 MG PO CHEW
324.0000 mg | CHEWABLE_TABLET | Freq: Once | ORAL | Status: AC
  Administered 2013-11-16: 324 mg via ORAL
  Filled 2013-11-16: qty 4

## 2013-11-16 MED ORDER — ALBUTEROL SULFATE (2.5 MG/3ML) 0.083% IN NEBU: 2.5000 mg | INHALATION_SOLUTION | Freq: Four times a day (QID) | RESPIRATORY_TRACT | Status: DC | PRN

## 2013-11-16 MED ORDER — SODIUM CHLORIDE 0.9 % IJ SOLN: 3.0000 mL | Freq: Two times a day (BID) | INTRAMUSCULAR | Status: DC

## 2013-11-16 MED ORDER — INSULIN ASPART 100 UNIT/ML ~~LOC~~ SOLN
0.0000 [IU] | Freq: Three times a day (TID) | SUBCUTANEOUS | Status: DC
  Administered 2013-11-17: 3 [IU] via SUBCUTANEOUS
  Administered 2013-11-18: 2 [IU] via SUBCUTANEOUS

## 2013-11-16 MED ORDER — HEPARIN (PORCINE) IN NACL 100-0.45 UNIT/ML-% IJ SOLN
1000.0000 [IU]/h | INTRAMUSCULAR | Status: DC
  Administered 2013-11-16: 1000 [IU]/h via INTRAVENOUS
  Filled 2013-11-16 (×3): qty 250

## 2013-11-16 MED ORDER — ATORVASTATIN CALCIUM 80 MG PO TABS
80.0000 mg | ORAL_TABLET | Freq: Every day | ORAL | Status: DC
  Administered 2013-11-16 – 2013-11-17 (×2): 80 mg via ORAL
  Filled 2013-11-16 (×4): qty 1

## 2013-11-16 MED ORDER — SODIUM CHLORIDE 0.9 % IJ SOLN: 3.0000 mL | INTRAMUSCULAR | Status: DC | PRN

## 2013-11-16 MED ORDER — ASPIRIN EC 81 MG PO TBEC
81.0000 mg | DELAYED_RELEASE_TABLET | Freq: Every day | ORAL | Status: DC
  Administered 2013-11-17: 81 mg via ORAL
  Filled 2013-11-16 (×2): qty 1

## 2013-11-16 MED ORDER — FLUOXETINE HCL 40 MG PO CAPS: 80.0000 mg | ORAL_CAPSULE | Freq: Every day | ORAL | Status: DC

## 2013-11-16 MED ORDER — ASPIRIN 81 MG PO TABS
81.0000 mg | ORAL_TABLET | Freq: Every day | ORAL | Status: DC
Start: 2013-11-16 — End: 2013-11-16

## 2013-11-16 MED ORDER — NITROGLYCERIN IN D5W 200-5 MCG/ML-% IV SOLN
5.0000 ug/min | INTRAVENOUS | Status: DC
Start: 2013-11-16 — End: 2013-11-17
  Administered 2013-11-16: 5 ug/min via INTRAVENOUS
  Filled 2013-11-16: qty 250

## 2013-11-16 MED ORDER — EZETIMIBE 10 MG PO TABS
10.0000 mg | ORAL_TABLET | Freq: Every day | ORAL | Status: DC
  Administered 2013-11-17 – 2013-11-18 (×2): 10 mg via ORAL
  Filled 2013-11-16 (×2): qty 1

## 2013-11-16 MED ORDER — DIAZEPAM 5 MG PO TABS
10.0000 mg | ORAL_TABLET | ORAL | Status: AC
  Administered 2013-11-17: 10 mg via ORAL
  Filled 2013-11-16: qty 2

## 2013-11-16 MED ORDER — FLUOXETINE HCL 20 MG PO CAPS
80.0000 mg | ORAL_CAPSULE | Freq: Every day | ORAL | Status: DC
  Administered 2013-11-17 – 2013-11-18 (×2): 80 mg via ORAL
  Filled 2013-11-16 (×2): qty 4

## 2013-11-16 MED ORDER — METFORMIN HCL 500 MG PO TABS
500.0000 mg | ORAL_TABLET | Freq: Two times a day (BID) | ORAL | Status: DC
  Filled 2013-11-16 (×3): qty 1

## 2013-11-16 NOTE — ED Notes (Signed)
Pt. Reports intermittent chest pain since Friday.  States started around 1100 today. Reports radiation to jaw and left arm. Denies N/V and SOB. Pt. Alert and oriented x4. Pt. Reports increased chest pain with exertion. Took 2 nitro which helped, chest pain currently 4/10.

## 2013-11-16 NOTE — ED Provider Notes (Signed)
CSN: [id number]     Arrival date & time 11/16/13  1510 History   First MD Initiated Contact with Patient 11/16/13 1523     Chief Complaint  Patient presents with  . Chest Pain     (Consider location/radiation/quality/duration/timing/severity/associated sxs/prior Treatment) Patient is a 53 y.o. male presenting with chest pain.  Chest Pain  Pt with history of MI and Stent x 2 previously followed by Riley KillStuckey, now seen by Clifton JamesMcAlhany has been doing well the last few years, had neg exercise treadmill stress test in Nov 2014 reports 3 days of intermittent chest pain, midsternal pressure, radiating into L neck, assocaited with SOB when severe and worse with exertion. Improved with rest and NTG taken today while enroute to the hospital with his wife. Pain was 10/10 earlier today but 3/10 now.   Past Medical History  Diagnosis Date  . Coronary artery disease     status post PCI of the RCA in 2000 with subsequent PCI to the the RCA in 2002 due to  the angina and in - stent restenosis by IVUS.   Marland Kitchen. Hypertension   . Asthma   . Depression    Past Surgical History  Procedure Laterality Date  . Appendectomy     Family History  Problem Relation Age of Onset  . Heart attack Father     age 51  . Coronary artery disease Brother     onset  CAD age 743   History  Substance Use Topics  . Smoking status: Never Smoker   . Smokeless tobacco: Never Used  . Alcohol Use: 0.5 oz/week    1 drink(s) per week    Review of Systems  Cardiovascular: Positive for chest pain.    All other systems reviewed and are negative except as noted in HPI.    Allergies  Dextrans  Home Medications   Current Outpatient Rx  Name  Route  Sig  Dispense  Refill  . albuterol (PROVENTIL HFA;VENTOLIN HFA) 108 (90 BASE) MCG/ACT inhaler   Inhalation   Inhale 2 puffs into the lungs every 6 (six) hours as needed for wheezing.         Marland Kitchen. aspirin 81 MG tablet   Oral   Take 81 mg by mouth daily.           Marland Kitchen. buPROPion  (WELLBUTRIN XL) 300 MG 24 hr tablet   Oral   Take 300 mg by mouth daily.           Marland Kitchen. dicyclomine (BENTYL) 10 MG capsule   Oral   Take 10 mg by mouth 4 (four) times daily -  before meals and at bedtime.         Marland Kitchen. EPINEPHrine (EPIPEN) 0.3 mg/0.3 mL SOAJ injection   Intramuscular   Inject 0.3 mg into the muscle as directed.         . ezetimibe (ZETIA) 10 MG tablet   Oral   Take 10 mg by mouth daily.           Marland Kitchen. FLUoxetine (PROZAC) 40 MG capsule   Oral   Take 40 mg by mouth 2 (two) times daily.           Marland Kitchen. LORazepam (ATIVAN) 1 MG tablet   Oral   Take 1 mg by mouth every 8 (eight) hours as needed for anxiety.         . metFORMIN (GLUCOPHAGE) 500 MG tablet   Oral   Take 500 mg by mouth 2 (two) times daily  with a meal.         . metoprolol (TOPROL-XL) 50 MG 24 hr tablet   Irrigation   Irrigate with 1 tablet as directed daily.          . nitroGLYCERIN (NITROSTAT) 0.4 MG SL tablet   Sublingual   Place 0.4 mg under the tongue every 5 (five) minutes as needed for chest pain.         Marland Kitchen omeprazole (PRILOSEC) 20 MG capsule   Oral   Take 20 mg by mouth daily.         . rosuvastatin (CRESTOR) 40 MG tablet   Oral   Take 40 mg by mouth daily.           Marland Kitchen SINGULAIR 10 MG tablet   Oral   Take 10 mg by mouth daily.           BP 143/88  Pulse 78  Temp(Src) 98.4 F (36.9 C) (Oral)  Resp 20  Ht 5\' 5"  (1.651 m)  Wt 156 lb (70.761 kg)  BMI 25.96 kg/m2  SpO2 99% Physical Exam  Nursing note and vitals reviewed. Constitutional: He is oriented to person, place, and time. He appears well-developed and well-nourished.  HENT:  Head: Normocephalic and atraumatic.  Eyes: EOM are normal. Pupils are equal, round, and reactive to light.  Neck: Normal range of motion. Neck supple.  Cardiovascular: Normal rate, normal heart sounds and intact distal pulses.   Pulmonary/Chest: Effort normal and breath sounds normal.  Abdominal: Bowel sounds are normal. He exhibits no  distension. There is no tenderness.  Musculoskeletal: Normal range of motion. He exhibits no edema and no tenderness.  Neurological: He is alert and oriented to person, place, and time. He has normal strength. No cranial nerve deficit or sensory deficit.  Skin: Skin is warm and dry. No rash noted.  Psychiatric: He has a normal mood and affect.    ED Course  Procedures (including critical care time)  CRITICAL CARE Performed by: Pollyann Savoy. Total critical care time: 35 Critical care time was exclusive of separately billable procedures and treating other patients. Critical care was necessary to treat or prevent imminent or life-threatening deterioration. Critical care was time spent personally by me on the following activities: development of treatment plan with patient and/or surrogate as well as nursing, discussions with consultants, evaluation of patient's response to treatment, examination of patient, obtaining history from patient or surrogate, ordering and performing treatments and interventions, ordering and review of laboratory studies, ordering and review of radiographic studies, pulse oximetry and re-evaluation of patient's condition.  Labs Review Labs Reviewed  BASIC METABOLIC PANEL - Abnormal; Notable for the following:    Glucose, Bld 127 (*)    All other components within normal limits  CBC WITH DIFFERENTIAL  TROPONIN I  PROTIME-INR  APTT  HEPARIN LEVEL (UNFRACTIONATED)  HEPARIN LEVEL (UNFRACTIONATED)  CBC   Imaging Review Dg Chest Port 1 View  11/16/2013   CLINICAL DATA:  Shortness of breath. Chest pain. Previous myocardial infarct.  EXAM: PORTABLE CHEST - 1 VIEW  COMPARISON:  04/16/2010  FINDINGS: The heart size and mediastinal contours are within normal limits. Both lungs are clear. The visualized skeletal structures are unremarkable.  IMPRESSION: No active disease.   Electronically Signed   By: Myles Rosenthal M.D.   On: 11/16/2013 16:47     EKG  Interpretation   Date/Time:  Sunday November 16 2013 15:18:58 EDT Ventricular Rate:  73 PR Interval:  175 QRS Duration: 89  QT Interval:  423 QTC Calculation: 466 R Axis:   74 Text Interpretation:  Sinus rhythm No significant change since last  tracing Confirmed by [facility]  MD, Leonette Most [phone]) on 11/16/2013 3:30:33 PM      MDM   Final diagnoses:  Unstable angina    Symptoms concerning for ACS/UA. Will check labs and CXR. EKG unchanged. NTG and Heparin drips.   6:23 PM Pain improved with NTG drip. Labs and imaging reviewed and neg. Discussed with Dr. Donnie Aho who will see in the ED and admit for further evaluation.     Brytney Somes B. Bernette Mayers, MD 11/16/13 [phone]

## 2013-11-16 NOTE — Progress Notes (Signed)
ANTICOAGULATION CONSULT NOTE - Initial Consult  Pharmacy Consult for Heparin Indication: chest pain/ACS  Allergies  Allergen Reactions  . Dextrans     Patient Measurements: Height: 5\' 5"  (165.1 cm) Weight: 156 lb (70.761 kg) IBW/kg (Calculated) : 61.5  Vital Signs: Temp: 98.4 F (36.9 C) (03/15 1519) Temp src: Oral (03/15 1519) BP: 143/88 mmHg (03/15 1516) Pulse Rate: 78 (03/15 1516)  Labs: No results found for this basename: HGB, HCT, PLT, APTT, LABPROT, INR, HEPARINUNFRC, CREATININE, CKTOTAL, CKMB, TROPONINI,  in the last 72 hours  Estimated Creatinine Clearance: 73.8 ml/min (by C-G formula based on Cr of 1.03).   Medical History: Past Medical History  Diagnosis Date  . Coronary artery disease     status post PCI of the RCA in 2000 with subsequent PCI to the the RCA in 2002 due to  the angina and in - stent restenosis by IVUS.   Marland Kitchen. Hypertension   . Asthma   . Depression     Medications:  See electronic home med  Assessment: 53 y.o. male presents with chest pain. H/o MI/stent x2 in 2000. To begin heparin for r/o ACS. Baseline labs pending.  Goal of Therapy:  Heparin level 0.3-0.7 units/ml Monitor platelets by anticoagulation protocol: Yes   Plan:  1. Heparin IV bolus 4000 units 2. Heparin gtt at 1000 units/hr 3. Will f/u 6 hr heparin level 4. Daily heparin level and CBC  Christoper Fabianaron Nija Koopman, PharmD, BCPS Clinical pharmacist, pager [phone] 11/16/2013,3:54 PM

## 2013-11-16 NOTE — H&P (Signed)
History and Physical   Admit date: 11/16/2013 Name:  Alexander Calderon Medical record number: [id number] DOB/Age:  1961/08/25  53 y.o. male  Referring Physician:   [facility]  Primary Cardiologist:  Dr. Marius Ditch  Primary Physician:  Dr. Jefm Petty   Chief complaint/reason for admission: Prolonged chest discomfort  HPI:  This 53 year old male has a strong family history of cardiac disease. He has had previous bare-metal stents to the right coronary artery in 2000 and 2002 and had PTC of the distal circumflex in 2011. He has been free of pain recently on Friday developed exertional midsternal tightness at the time he was walking around. He had recurrent pain today while walking there was severe radiated somewhat into the left arm became associated with shortness of breath. He was given 2 nitroglycerin and had significant diminution of his pain and is now pain-free after having been started on nitroglycerin and heparin in the emergency room. An EKG showed minor ST depression in V4 and V5. Initial troponin is negative. He is brought in the hospital for treatment of unstable angina. Prior to Friday he was not having much in the way of cardiac symptoms. He works at the [employer] and denies PND, orthopnea, syncope, or claudication.    Past Medical History  Diagnosis Date  . Coronary artery disease     status post PCI of the RCA in 2000 with subsequent PCI to the the RCA in 2002 due to  the angina and in - stent restenosis by IVUS.   Marland Kitchen Hypertension   . Asthma   . Depression   . Hyperlipidemia   . Type 2 diabetes mellitus   . GERD (gastroesophageal reflux disease)      Past Surgical History  Procedure Laterality Date  . Appendectomy     Allergies: is allergic to dextrans.   Medications: Prior to Admission medications   Medication Sig Start Date End Date Taking? Authorizing Provider  albuterol (PROVENTIL HFA;VENTOLIN HFA) 108 (90 BASE) MCG/ACT inhaler Inhale 2 puffs  into the lungs every 6 (six) hours as needed for wheezing.   Yes Historical Provider, MD  aspirin 81 MG tablet Take 81 mg by mouth daily.     Yes Historical Provider, MD  buPROPion (WELLBUTRIN XL) 300 MG 24 hr tablet Take 300 mg by mouth daily.     Yes Historical Provider, MD  EPINEPHrine (EPIPEN) 0.3 mg/0.3 mL SOAJ injection Inject 0.3 mg into the muscle as directed.   Yes Historical Provider, MD  ezetimibe (ZETIA) 10 MG tablet Take 10 mg by mouth daily.     Yes Historical Provider, MD  FLUoxetine (PROZAC) 40 MG capsule Take 80 mg by mouth daily.    Yes Historical Provider, MD  LORazepam (ATIVAN) 1 MG tablet Take 1 mg by mouth every 8 (eight) hours as needed for anxiety.   Yes Historical Provider, MD  metFORMIN (GLUCOPHAGE) 500 MG tablet Take 500 mg by mouth 2 (two) times daily with a meal.   Yes Historical Provider, MD  metoprolol (TOPROL-XL) 50 MG 24 hr tablet Take 1 tablet by mouth daily.  02/21/11  Yes Historical Provider, MD  nitroGLYCERIN (NITROSTAT) 0.4 MG SL tablet Place 0.4 mg under the tongue every 5 (five) minutes as needed for chest pain.   Yes Historical Provider, MD  rosuvastatin (CRESTOR) 40 MG tablet Take 40 mg by mouth daily.     Yes Historical Provider, MD  SINGULAIR 10 MG tablet Take 10 mg by mouth daily.  02/14/11  Yes Historical Provider, MD   Family History:  Family Status  Relation Status Death Age  . Father Deceased 1    died of mI  . Mother Deceased 56    Comps of Multiple sclerois  . Brother Alive     histoey of CAD and stent  . Sister Alive    Social History:   reports that he has never smoked. He has never used smokeless tobacco. He reports that he drinks about 0.5 ounces of alcohol per week. He reports that he does not use illicit drugs.   History   Social History Narrative   The lives in [place] ,[place] with his wife . He works at the post office. 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 He feels as if he has some difficulty maintaining an erection but no gross erectile dysfunction. No nocturia. He has occasional symptoms of reflux. Other than as noted above the remainder of the review of systems is unremarkable.  Physical Exam: BP 117/70  Pulse 64  Temp(Src) 98.4 F (36.9 C) (Oral)  Resp 21  Ht $R'5\' 5"'lF$  (1.651 m)  Wt 70.761 kg (156 lb)  BMI 25.96 kg/m2  SpO2 96% General appearance: Pleasant white male in no acute distress Head: Normocephalic, without obvious abnormality, atraumatic Eyes: conjunctivae/corneas clear. PERRL, EOM's intact. Fundi not examined  Neck: no adenopathy, no carotid bruit, no JVD and supple, symmetrical, trachea midline Lungs: clear to auscultation bilaterally Heart: regular rate and rhythm, S1, S2 normal, no murmur, click, rub or gallop Abdomen: soft, non-tender; bowel sounds normal; no masses,  no organomegaly Rectal: deferred Extremities: extremities normal, atraumatic, no cyanosis or edema Pulses: 2+ and symmetric Skin: Skin color, texture, turgor normal. No rashes or lesions Neurologic: Grossly normal  Labs: CBC  Recent Labs  11/16/13 1545  WBC 6.2  RBC 4.86  HGB 13.9  HCT 40.5  PLT 229  MCV 83.3  MCH 28.6  MCHC 34.3  RDW 12.9  LYMPHSABS 1.5  MONOABS 0.4  EOSABS 0.1  BASOSABS 0.0   CMP   Recent Labs  11/16/13 1545  NA 140  K 4.2  CL 100  CO2 27  GLUCOSE 127*  BUN 18  CREATININE 0.80  CALCIUM 10.1  GFRNONAA >90  GFRAA >90   Cardiac Panel (last 3 results)  Recent Labs  11/16/13 1545  TROPONINI <0.30   Thyroid  Lab Results  Component Value Date   TSH 1.730 04/17/2010    EKG: Sinus rhythm, minor ST depression in V4 and V5.  Radiology: No active disease   IMPRESSIONS: 1. Unstable angina pectoris 2. Coronary artery disease with previous bare-metal stents x2 the right coronary artery and PTCA of the distal circumflex 3.  Hypertension 4. Hyperlipidemia 5. Type 2 diabetes 6. History of depression  PLAN: Admission to step down unit with intravenous nitroglycerin and intravenous heparin. Serial cardiac enzymes. Will need repeat catheterization.   Cardiac catheterization was discussed with the patient fully including risks of myocardial infarction, death, stroke, bleeding, arrhythmia, dye allergy, renal insufficiency or bleeding.  The patient understands and is willing to proceed.  Signed: Kerry Hough MD [facility] Card[facility]  11/16/2013, 6:42 PM

## 2013-11-16 NOTE — ED Notes (Signed)
Pt c/o chest pain since 1000 has taken 2 ntg which did relieve the pain  now rates his pain at 5. No shob or nausea.

## 2013-11-17 ENCOUNTER — Encounter (HOSPITAL_COMMUNITY)
Admission: EM | Disposition: A | Discharge status: Home / Self Care | Payer: Federal, State, Local not specified - PPO | Source: Home / Self Care | Attending: Cardiovascular Disease

## 2013-11-17 DIAGNOSIS — I2510 Atherosclerotic heart disease of native coronary artery without angina pectoris: Secondary | ICD-10-CM

## 2013-11-17 HISTORY — PX: LEFT HEART CATHETERIZATION WITH CORONARY ANGIOGRAM: SHX5451

## 2013-11-17 LAB — CBC
HCT: 36.6 % — ABNORMAL LOW (ref 39.0–52.0)
HEMOGLOBIN: 12.3 g/dL — AB (ref 13.0–17.0)
MCH: 28 pg (ref 26.0–34.0)
MCHC: 33.6 g/dL (ref 30.0–36.0)
MCV: 83.2 fL (ref 78.0–100.0)
Platelets: 200 10*3/uL (ref 150–400)
RBC: 4.4 MIL/uL (ref 4.22–5.81)
RDW: 12.8 % (ref 11.5–15.5)
WBC: 6.5 10*3/uL (ref 4.0–10.5)

## 2013-11-17 LAB — GLUCOSE, CAPILLARY
GLUCOSE-CAPILLARY: 116 mg/dL — AB (ref 70–99)
GLUCOSE-CAPILLARY: 119 mg/dL — AB (ref 70–99)
Glucose-Capillary: 180 mg/dL — ABNORMAL HIGH (ref 70–99)
Glucose-Capillary: 114 mg/dL — ABNORMAL HIGH (ref 70–99)

## 2013-11-17 LAB — LIPID PANEL
CHOL/HDL RATIO: 3.3 ratio
Cholesterol: 141 mg/dL (ref 0–200)
HDL: 43 mg/dL (ref >39)
LDL Cholesterol: 40 mg/dL (ref 0–99)
Triglycerides: 289 mg/dL — ABNORMAL HIGH (ref <150)
VLDL: 58 mg/dL — AB (ref 0–40)

## 2013-11-17 LAB — TROPONIN I
Troponin I: <0.3 ng/mL (ref <0.30)
Troponin I: <0.3 ng/mL (ref <0.30)

## 2013-11-17 LAB — HEPARIN LEVEL (UNFRACTIONATED)
HEPARIN UNFRACTIONATED: 0.33 [IU]/mL (ref 0.30–0.70)
Heparin Unfractionated: 0.39 IU/mL (ref 0.30–0.70)

## 2013-11-17 LAB — HEMOGLOBIN A1C
Hgb A1c MFr Bld: 6.8 % — ABNORMAL HIGH (ref <5.7)
Mean Plasma Glucose: 148 mg/dL — ABNORMAL HIGH (ref <117)

## 2013-11-17 LAB — MRSA PCR SCREENING: MRSA by PCR: NEGATIVE

## 2013-11-17 SURGERY — LEFT HEART CATHETERIZATION WITH CORONARY ANGIOGRAM
Anesthesia: LOCAL

## 2013-11-17 MED ORDER — MIDAZOLAM HCL 2 MG/2ML IJ SOLN
INTRAMUSCULAR | Status: AC
  Filled 2013-11-17: qty 2

## 2013-11-17 MED ORDER — VERAPAMIL HCL 2.5 MG/ML IV SOLN
INTRAVENOUS | Status: AC
  Filled 2013-11-17: qty 2

## 2013-11-17 MED ORDER — TICAGRELOR 90 MG PO TABS
ORAL_TABLET | ORAL | Status: AC
Start: 2013-11-17 — End: 2013-11-17
  Filled 2013-11-17: qty 1

## 2013-11-17 MED ORDER — LIVING WELL WITH DIABETES BOOK
Freq: Once | Status: AC
  Administered 2013-11-18: 06:00:00
  Filled 2013-11-17: qty 1

## 2013-11-17 MED ORDER — HEPARIN SODIUM (PORCINE) 1000 UNIT/ML IJ SOLN
INTRAMUSCULAR | Status: AC
  Filled 2013-11-17: qty 1

## 2013-11-17 MED ORDER — LIDOCAINE HCL (PF) 1 % IJ SOLN
INTRAMUSCULAR | Status: AC
  Filled 2013-11-17: qty 30

## 2013-11-17 MED ORDER — BIVALIRUDIN 250 MG IV SOLR
INTRAVENOUS | Status: AC
  Filled 2013-11-17: qty 250

## 2013-11-17 MED ORDER — NITROGLYCERIN 0.2 MG/ML ON CALL CATH LAB
INTRAVENOUS | Status: AC
  Filled 2013-11-17: qty 1

## 2013-11-17 MED ORDER — SODIUM CHLORIDE 0.9 % IV SOLN: 1.0000 mL/kg/h | INTRAVENOUS | Status: AC

## 2013-11-17 MED ORDER — FENTANYL CITRATE 0.05 MG/ML IJ SOLN
INTRAMUSCULAR | Status: AC
  Filled 2013-11-17: qty 2

## 2013-11-17 MED ORDER — ACTIVE PARTNERSHIP FOR HEALTH OF YOUR HEART BOOK
Freq: Once | Status: AC
  Administered 2013-11-18: 06:00:00
  Filled 2013-11-17: qty 1

## 2013-11-17 MED ORDER — TICAGRELOR 90 MG PO TABS
90.0000 mg | ORAL_TABLET | Freq: Two times a day (BID) | ORAL | Status: DC
  Administered 2013-11-17 – 2013-11-18 (×2): 90 mg via ORAL
  Filled 2013-11-17 (×3): qty 1

## 2013-11-17 MED ORDER — HEPARIN (PORCINE) IN NACL 2-0.9 UNIT/ML-% IJ SOLN
INTRAMUSCULAR | Status: AC
  Filled 2013-11-17: qty 1000

## 2013-11-17 NOTE — Progress Notes (Signed)
ANTICOAGULATION CONSULT NOTE - Follow Up Consult  Pharmacy Consult for heparin Indication: USAP  Labs:  Recent Labs  11/16/13 0005 11/16/13 1545 11/16/13 1927 11/17/13 0005  HGB  --  13.9  --   --   HCT  --  40.5  --   --   PLT  --  229  --   --   APTT  --  29 163*  --   LABPROT  --  11.9 12.8  --   INR  --  0.89 0.98  --   HEPARINUNFRC 0.33  --   --   --   CREATININE  --  0.80 0.82  --   TROPONINI  --  <0.30 <0.30 <0.30    Assessment/Plan: 53yo male therapeutic on heparin with initial dosing for CP (lab in as 3/15 but was drawn ~MN 3/16).  Will continue gtt at current rate and confirm stable with additional level.   Vernard GamblesVeronda Sevilla Murtagh, PharmD, BCPS  11/17/2013,1:20 AM

## 2013-11-17 NOTE — Interval H&P Note (Signed)
History and Physical Interval Note:  11/17/2013 11:36 AM  Alexander AmourMichael Mortimer  has presented today for surgery, with the diagnosis of cp  The various methods of treatment have been discussed with the patient and family. After consideration of risks, benefits and other options for treatment, the patient has consented to  Procedure(s): LEFT HEART CATHETERIZATION WITH CORONARY ANGIOGRAM (N/A) as a surgical intervention .  The patient's history has been reviewed, patient examined, no change in status, stable for surgery.  I have reviewed the patient's chart and labs.  Questions were answered to the patient's satisfaction.   Cath Lab Visit (complete for each Cath Lab visit)  Clinical Evaluation Leading to the Procedure:   ACS: yes  Non-ACS:    Anginal Classification: CCS III  Anti-ischemic medical therapy: Minimal Therapy (1 class of medications)  Non-Invasive Test Results: No non-invasive testing performed  Prior CABG: No previous CABG        Meghna Hagmann [place] MD,FACC 11/17/2013 11:36 AM

## 2013-11-17 NOTE — CV Procedure (Signed)
    Cardiac Catheterization Procedure Note  Name: Melchor AmourMichael Suminski MRN: [id number] DOB: 11-07-1960  Procedure: Left Heart Cath, Selective Coronary Angiography, LV angiography, PTCA and stenting of the distal RCA  Indication: 53yo WM with history of CAD s/p stenting of the RCA with BMS in 2000 and 2002, PTCA of the distal LCx in 2011. Now presents with unstable angina.  Procedural Details:  Review of his prior cath in 2011 demonstrated difficulty engaging the LCA and significant radial spasm from a right radial approach. I therefore used the left radial artery for access. The left wrist was prepped, draped, and anesthetized with 1% lidocaine. Using the modified Seldinger technique, a 6 French sheath was introduced into the left radial artery. 3 mg of verapamil was administered through the sheath, weight-based unfractionated heparin was administered intravenously. Standard Judkins catheters were used for selective coronary angiography and left ventriculography. Catheter exchanges were performed over an exchange length guidewire.  PROCEDURAL FINDINGS Hemodynamics: AO 116/67 mean 89 mm Hg LV 118/24 mm Hg   Coronary angiography: Coronary dominance: right  Left mainstem: 30% ostial  Left anterior descending (LAD): Diffuse disease in the LAD up to 30%. The first diagonal has 50% ostial disease.   Left circumflex (LCx): 30% distal disease at prior PTCA site. No other significant disease.   Right coronary artery (RCA): The stents in the mid RCA are widely patent. There is 30% disease in the distal portion of the more distal stent. There is a 90% stenosis in the distal RCA prior to the PDA.  Left ventriculography: Left ventricular systolic function is normal, LVEF is estimated at 55-65%, there is no significant mitral regurgitation   PCI Note:  Following the diagnostic procedure, the decision was made to proceed with PCI of the distal RCA.  Weight-based bivalirudin was given for anticoagulation.  Brilinta 180 mg was given orally. Once a therapeutic ACT was achieved, a 6 [place] FL4 guide catheter was inserted.  A prowater coronary guidewire was used to cross the lesion.  The lesion was predilated with a 2.5 mm balloon.  The lesion was then stented with a 2.75 x 16 mm Promus stent.  The stent was postdilated with a 3.0 mm noncompliant balloon.  Following PCI, there was 0% residual stenosis and TIMI-3 flow. Final angiography confirmed an excellent result. The patient tolerated the procedure well. There were no immediate procedural complications. A TR band was used for radial hemostasis. The patient was transferred to the post catheterization recovery area for further monitoring.  PCI Data: Vessel - RCA/Segment - distal Percent Stenosis (pre)  90% TIMI-flow 3 Stent 2.75 x 16 mm Promus Percent Stenosis (post) 0% TIMI-flow (post) 3  Final Conclusions:   1. Single vessel obstructive CAD with denovo lesion in the distal RCA. 2. Normal LV function. 3. Successful stenting of the distal RCA with DES.   Recommendations:  Continue dual antiplatelet therapy for one year.   Theron Aristaeter Sparrow Specialty HospitalJordanMD,FACC 11/17/2013, 12:45 PM

## 2013-11-17 NOTE — Progress Notes (Signed)
TR BAND REMOVAL  LOCATION:    left radial  DEFLATED PER PROTOCOL:    yes  TIME BAND OFF / DRESSING APPLIED:    1700   SITE UPON ARRIVAL:    Level 0  SITE AFTER BAND REMOVAL:    Level 0  REVERSE ALLEN'S TEST:     positive  CIRCULATION SENSATION AND MOVEMENT:    Within Normal Limits   yes  COMMENTS:   Gauze dressing applied, rechecked at 1730 without change in assessment. Dressing remains dry and intact, CSMs wnls and radial and ulnar pulses +2.

## 2013-11-17 NOTE — Care Management Note (Signed)
    Page 1 of 1   11/17/2013     2:24:12 PM   CARE MANAGEMENT NOTE 11/17/2013  Patient:  Alexander Calderon,Alexander Calderon   Account Number:  [id number]  Date Initiated:  11/17/2013  Documentation initiated by:  Oletta CohnWOOD,Earley Grobe  Subjective/Objective Assessment:   53 yo male admitted with prolonged chest pain//Home with spouse     Action/Plan:   Admission to step down unit with IV nitroglycerin and IV heparin. Serial cardiac enzymes. Will need repeat catheterization.   Cardiac catheterization planned for 3/16.//Benefits check for Brilinta   Anticipated DC Date:  11/18/2013   Anticipated DC Plan:  HOME/SELF CARE      DC Planning Services  CM consult      Choice offered to / List presented to:             Status of service:  In process, will continue to follow Medicare Important Message given?   (If response is "NO", the following Medicare IM given date fields will be blank) Date Medicare IM given:   Date Additional Medicare IM given:    Discharge Disposition:    Per UR Regulation:    If discussed at Long Length of Stay Meetings, dates discussed:    Comments:  11/17/13 1400 Amiere Cawley, RN, BSN, [place] [phone] Benefits check for Brilinta 90mg  BID.

## 2013-11-17 NOTE — Progress Notes (Signed)
Nutrition Brief Note  Patient identified on the Malnutrition Screening Tool (MST) Report for recent weight lost without trying.  Patient reports he's been "cutting back" on his alcohol intake.  States his appetite remains good.  Wt Readings from Last 15 Encounters:  11/16/13 155 lb 10.3 oz (70.6 kg)  11/16/13 155 lb 10.3 oz (70.6 kg)  05/27/13 157 lb (71.215 kg)  03/12/12 170 lb (77.111 kg)  02/22/11 167 lb (75.751 kg)  07/05/10 169 lb 4 oz (76.771 kg)  05/12/10 171 lb (77.565 kg)  06/24/09 176 lb (79.833 kg)    Body mass index is 25.9 kg/(m^2). Patient meets criteria for Overweight based on current BMI.   Current diet order is Carbohydrate Modified, patient is consuming approximately 100% of meals at this time. Labs and medications reviewed.   No nutrition interventions warranted at this time. If nutrition issues arise, please consult RD.   Maureen ChattersKatie Breslin Burklow, RD, LDN Pager #: [phone] After-Hours Pager #: [phone]

## 2013-11-17 NOTE — Progress Notes (Signed)
Utilization Review Completed Tamiyah Moulin J. Janelly Switalski, RN, BSN, NCM [phone]  

## 2013-11-18 ENCOUNTER — Encounter (HOSPITAL_COMMUNITY): Payer: Self-pay | Admitting: Physician Assistant

## 2013-11-18 DIAGNOSIS — I200 Unstable angina: Secondary | ICD-10-CM

## 2013-11-18 LAB — CBC
HCT: 39 % (ref 39.0–52.0)
Hemoglobin: 13.4 g/dL (ref 13.0–17.0)
MCH: 28.3 pg (ref 26.0–34.0)
MCHC: 34.4 g/dL (ref 30.0–36.0)
MCV: 82.5 fL (ref 78.0–100.0)
PLATELETS: 186 10*3/uL (ref 150–400)
RBC: 4.73 MIL/uL (ref 4.22–5.81)
RDW: 12.7 % (ref 11.5–15.5)
WBC: 5.7 10*3/uL (ref 4.0–10.5)

## 2013-11-18 LAB — POCT ACTIVATED CLOTTING TIME: Activated Clotting Time: 525 seconds

## 2013-11-18 LAB — BASIC METABOLIC PANEL
BUN: 12 mg/dL (ref 6–23)
CHLORIDE: 101 meq/L (ref 96–112)
CO2: 29 meq/L (ref 19–32)
CREATININE: 0.81 mg/dL (ref 0.50–1.35)
Calcium: 9.6 mg/dL (ref 8.4–10.5)
GFR calc non Af Amer: >90 mL/min (ref >90)
Glucose, Bld: 144 mg/dL — ABNORMAL HIGH (ref 70–99)
Potassium: 4.5 mEq/L (ref 3.7–5.3)
SODIUM: 141 meq/L (ref 137–147)

## 2013-11-18 LAB — GLUCOSE, CAPILLARY: Glucose-Capillary: 129 mg/dL — ABNORMAL HIGH (ref 70–99)

## 2013-11-18 MED ORDER — TICAGRELOR 90 MG PO TABS: 90.0000 mg | ORAL_TABLET | Freq: Two times a day (BID) | ORAL | Status: DC

## 2013-11-18 MED ORDER — METFORMIN HCL 500 MG PO TABS: 500.0000 mg | ORAL_TABLET | Freq: Two times a day (BID) | ORAL | Status: DC

## 2013-11-18 MED FILL — Sodium Chloride IV Soln 0.9%: INTRAVENOUS | Qty: 50 | Status: AC

## 2013-11-18 NOTE — Discharge Summary (Signed)
Discharge Summary   Patient ID: Alexander Calderon MRN: [id number], DOB/AGE: 1961-04-17 53 y.o. Admit date: 11/16/2013 D/C date:     11/18/2013  Primary Care Provider: Sid Falcon, MD Primary Cardiologist: [facility]  Primary Discharge Diagnoses:  1. CAD/unstable angina - this admission: s/p DES to distal RCA, normal LV function - history of BMS to RCA in 2000 and again in 2002, PTCA of the distal LCx in 2011 - prior history of difficulty engaging the LCA and significant radial spasm from R radial approach, cathed by L radial approach  Secondary Discharge Diagnoses:  1. HTN 2. Asthma 3. Depression 4. DM 5. GERD  Hospital Course: Alexander Calderon is a 53 y/o M with history of DM, HLD, HTN, and CAD s/p bare-metal stents to the right coronary artery in 2000 and 2002 and PTCA of the distal circumflex in 2011. He presented to [facility] 11/16/2013 with chest pain concerning for [place]. He had been free of pain recently except on 11/14/13 developed exertional midsternal tightness while walking around. He had recurrent pain on day of admission while walking with radiation into the left arm and associated SOB. He received 2 NTG with significant decrease in pain. He came to the ER where EKG showed minor ST depression in V4 and V5. Initial troponin was negative. He was admitted and placed on IV heparin. He underwent cardiac cath on 11/17/13 demonstrating single vessel obstructive CAD with denovo lesion in the distal RCA, normal LV function - he subsequently had successful stenting of the distal RCA with DES. Recommendation was to continue DAPT for one year. The patient did well post-cath. Alexander Calderon has seen and examined the patient today and feels he is stable for discharge. He recommended the patient return to work 11/25/13. I have left a message on our office's scheduling voicemail requesting a follow-up appointment, and our office will call the patient with this appointment. The pt was given the 30 day free  Brilinta RX along with regular e-scribed prescription. He was instructed to resume Metformin the evening of 3/18 (>48hrs post cath).  Discharge Vitals: Blood pressure 113/77, pulse 68, temperature 97.7 F (36.5 C), temperature source Oral, resp. rate 18, height 5\' 5"  (1.651 m), weight 156 lb 8.4 oz (71 kg), SpO2 95.00%.  Labs: Lab Results  Component Value Date   WBC 5.7 11/18/2013   HGB 13.4 11/18/2013   HCT 39.0 11/18/2013   MCV 82.5 11/18/2013   PLT 186 11/18/2013     Recent Labs Lab 11/16/13 1927 11/18/13 0817  NA 146 141  K 4.5 4.5  CL 106 101  CO2 29 29  BUN 16 12  CREATININE 0.82 0.81  CALCIUM 9.5 9.6  PROT 6.1  --   BILITOT <0.2*  --   ALKPHOS 104  --   ALT 67*  --   AST 39*  --   GLUCOSE 100* 144*    Recent Labs  11/16/13 1545 11/16/13 1927 11/17/13 0005 11/17/13 0722  TROPONINI <0.30 <0.30 <0.30 <0.30   Lab Results  Component Value Date   CHOL 141 11/17/2013   HDL 43 11/17/2013   LDLCALC 40 11/17/2013   TRIG 289* 11/17/2013     Diagnostic Studies/Procedures   Dg Chest Port 1 View 11/16/2013   CLINICAL DATA:  Shortness of breath. Chest pain. Previous myocardial infarct.  EXAM: PORTABLE CHEST - 1 VIEW  COMPARISON:  04/16/2010  FINDINGS: The heart size and mediastinal contours are within normal limits. Both lungs are clear. The visualized skeletal  structures are unremarkable.  IMPRESSION: No active disease.   Electronically Signed   By: Alexander Calderon M.D.   On: 11/16/2013 16:47   Cath 11/17/13 Cardiac Catheterization Procedure Note  Name: Alexander Calderon  MRN: [id number]  DOB: 03/27/1961  Procedure: Left Heart Cath, Selective Coronary Angiography, LV angiography, PTCA and stenting of the distal RCA  Indication: 53yo WM with history of CAD s/p stenting of the RCA with BMS in 2000 and 2002, PTCA of the distal LCx in 2011. Now presents with unstable angina.  Procedural Details: Review of his prior cath in 2011 demonstrated difficulty engaging the LCA and significant  radial spasm from a right radial approach. I therefore used the left radial artery for access. The left wrist was prepped, draped, and anesthetized with 1% lidocaine. Using the modified Seldinger technique, a 6 French sheath was introduced into the left radial artery. 3 mg of verapamil was administered through the sheath, weight-based unfractionated heparin was administered intravenously. Standard Judkins catheters were used for selective coronary angiography and left ventriculography. Catheter exchanges were performed over an exchange length guidewire.  PROCEDURAL FINDINGS  Hemodynamics:  AO 116/67 mean 89 mm Hg  LV 118/24 mm Hg  Coronary angiography:  Coronary dominance: right  Left mainstem: 30% ostial  Left anterior descending (LAD): Diffuse disease in the LAD up to 30%. The first diagonal has 50% ostial disease.  Left circumflex (LCx): 30% distal disease at prior PTCA site. No other significant disease.  Right coronary artery (RCA): The stents in the mid RCA are widely patent. There is 30% disease in the distal portion of the more distal stent. There is a 90% stenosis in the distal RCA prior to the PDA.  Left ventriculography: Left ventricular systolic function is normal, LVEF is estimated at 55-65%, there is no significant mitral regurgitation  PCI Note: Following the diagnostic procedure, the decision was made to proceed with PCI of the distal RCA. Weight-based bivalirudin was given for anticoagulation. Brilinta 180 mg was given orally. Once a therapeutic ACT was achieved, a 6 [place] FL4 guide catheter was inserted. A prowater coronary guidewire was used to cross the lesion. The lesion was predilated with a 2.5 mm balloon. The lesion was then stented with a 2.75 x 16 mm Promus stent. The stent was postdilated with a 3.0 mm noncompliant balloon. Following PCI, there was 0% residual stenosis and TIMI-3 flow. Final angiography confirmed an excellent result. The patient tolerated the procedure well.  There were no immediate procedural complications. A TR band was used for radial hemostasis. The patient was transferred to the post catheterization recovery area for further monitoring.  PCI Data:  Vessel - RCA/Segment - distal  Percent Stenosis (pre) 90%  TIMI-flow 3  Stent 2.75 x 16 mm Promus  Percent Stenosis (post) 0%  TIMI-flow (post) 3  Final Conclusions:  1. Single vessel obstructive CAD with denovo lesion in the distal RCA.  2. Normal LV function.  3. Successful stenting of the distal RCA with DES.  Recommendations:  Continue dual antiplatelet therapy for one year.  Theron Arista [facility]  11/17/2013, 12:45 PM  Discharge Medications   Additional rx for Brilinta disp #60 with 0 RF given to use with assistance card  Current Discharge Medication List    START taking these medications   Details  Ticagrelor (BRILINTA) 90 MG TABS tablet Take 1 tablet (90 mg total) by mouth 2 (two) times daily. Qty: 60 tablet, Refills: 10      CONTINUE these medications which have CHANGED  Details  metFORMIN (GLUCOPHAGE) 500 MG tablet Take 1 tablet (500 mg total) by mouth 2 (two) times daily with a meal.  Instructions given to restart evening of 11/19/13    CONTINUE these medications which have NOT CHANGED   Details  albuterol (PROVENTIL HFA;VENTOLIN HFA) 108 (90 BASE) MCG/ACT inhaler Inhale 2 puffs into the lungs every 6 (six) hours as needed for wheezing.    aspirin 81 MG tablet Take 81 mg by mouth daily.      buPROPion (WELLBUTRIN XL) 300 MG 24 hr tablet Take 300 mg by mouth daily.      EPINEPHrine (EPIPEN) 0.3 mg/0.3 mL SOAJ injection Inject 0.3 mg into the muscle as directed.    ezetimibe (ZETIA) 10 MG tablet Take 10 mg by mouth daily.      FLUoxetine (PROZAC) 40 MG capsule Take 80 mg by mouth daily.     LORazepam (ATIVAN) 1 MG tablet Take 1 mg by mouth every 8 (eight) hours as needed for anxiety.    metoprolol (TOPROL-XL) 50 MG 24 hr tablet Take 1 tablet by mouth daily.       nitroGLYCERIN (NITROSTAT) 0.4 MG SL tablet Place 0.4 mg under the tongue every 5 (five) minutes as needed for chest pain.    rosuvastatin (CRESTOR) 40 MG tablet Take 40 mg by mouth daily.      SINGULAIR 10 MG tablet Take 10 mg by mouth daily.         Disposition   The patient will be discharged in stable condition to home. Discharge Orders   Future Orders Complete By Expires   Amb Referral to Cardiac Rehabilitation  As directed    Diet - low sodium heart healthy  As directed    Scheduling Instructions:     Diabetic Diet   Discharge instructions  As directed    Comments:     Do not restart Metformin until tomorrow night (11/19/13).   Increase activity slowly  As directed    Scheduling Instructions:     No driving for 2 days. No lifting over 5 lbs for 1 week. No sexual activity for 1 week. You may return to work on 11/25/13. Keep procedure site clean & dry. If you notice increased pain, swelling, bleeding or pus, call/return!  You may shower, but no soaking baths/hot tubs/pools for 1 week.     Follow-up Information   Follow up with Verne CarrowMCALHANY,CHRISTOPHER, MD. (Office will call you for your followup appointment. Call office if you have not heard back in 3 days.)    Specialty:  Cardiology   Contact information:   [address]  [address] [place] [address] [phone]         Duration of Discharge Encounter: Greater than 30 minutes including physician and PA time.  Signed, Ronie Spiesayna Dunn PA-C 11/18/2013, 9:49 AM

## 2013-11-18 NOTE — Discharge Summary (Signed)
Patient seen and examined and history reviewed. Agree with above findings and plan. See prior rounding note.   Thedora Hinderseter JordanMD 11/18/2013 12:54 PM

## 2013-11-18 NOTE — Progress Notes (Signed)
CARDIAC REHAB PHASE I   PRE:  Rate/Rhythm: 64 SR    BP: sitting 113/77    SaO2:   MODE:  Ambulation: 550 ft   POST:  Rate/Rhythm: 74 SR    BP: sitting 126/78     SaO2:   Tolerated well. Ed completed. Pt admits to struggling with taking pills twice a day, diet and exercise. Encouraged pt to slowly make change and have more of an open mind to healthier food. Discussed the absolute importance of Brilinta twice a day. Pt sts he is interested in giving CRPII a try, which is a step in the right direction for him. Will send referral to [place]. [phone]   Harriet MassonReeve, Daizha Anand Kristan CES, ACSM 11/18/2013 8:49 AM

## 2013-12-01 ENCOUNTER — Encounter: Payer: Self-pay | Admitting: Cardiovascular Disease

## 2013-12-01 ENCOUNTER — Ambulatory Visit (INDEPENDENT_AMBULATORY_CARE_PROVIDER_SITE_OTHER): Payer: Federal, State, Local not specified - PPO | Admitting: Cardiovascular Disease

## 2013-12-01 VITALS — BP 130/80 | HR 77 | Ht 65.0 in | Wt 159.0 lb

## 2013-12-01 DIAGNOSIS — I10 Essential (primary) hypertension: Secondary | ICD-10-CM

## 2013-12-01 DIAGNOSIS — E785 Hyperlipidemia, unspecified: Secondary | ICD-10-CM

## 2013-12-01 DIAGNOSIS — I2510 Atherosclerotic heart disease of native coronary artery without angina pectoris: Secondary | ICD-10-CM

## 2013-12-01 NOTE — Patient Instructions (Signed)
Your physician wants you to follow-up in:  6 months. You will receive a reminder letter in the mail two months in advance. If you don't receive a letter, please call our office to schedule the follow-up appointment.   

## 2013-12-01 NOTE — Progress Notes (Signed)
History of Present Illness: 53 yo male with history of CAD, HTN, asthma here today for cardiac follow up. He has been followed in the past by Dr. Riley KillStuckey. His CAD dates back to 2000 when he had a bare metal stent placed in the RCA. Next cath 2002 with another bare metal stent RCA. Last cath 2011 with PTCA of small distal Circumflex. Last echo 2011 with LVEF=55-60%, mild LVH. Exercise treadmill stress test November 2014 without ischemia. Admitted with unstable angina 11/16/13 at [facility] and Cardiac cath 11/17/13 per Dr. SwazilandJordan showed severe RCA stenosis. A 2.75 x 16 mm Promus DES was placed in the distal RCA.   He is here today for follow up. No chest pain or SOB. No near syncope, syncope or dizziness.   Primary Care Physician: Leonette MostKalish  Last Lipid Profile:  Lipid Panel     Component Value Date/Time   CHOL 141 11/17/2013 0255   TRIG 289* 11/17/2013 0255   HDL 43 11/17/2013 0255   CHOLHDL 3.3 11/17/2013 0255   VLDL 58* 11/17/2013 0255   LDLCALC 40 11/17/2013 0255    Past Medical History  Diagnosis Date  . Coronary artery disease     a. history of BMS to RCA in 2000 and again in 2002. b. PTCA of the distal LCx in 2011. c. [place] 11/2013: s/p DES to distal RCA, normal LV function (done from L radial approach as pt has history of  difficulty engaging the LCA and significant radial spasm from R radial approach).  . Hypertension   . Asthma   . Depression   . Hyperlipidemia   . Type 2 diabetes mellitus   . GERD (gastroesophageal reflux disease)     Past Surgical History  Procedure Laterality Date  . Appendectomy      Current Outpatient Prescriptions  Medication Sig Dispense Refill  . albuterol (PROVENTIL HFA;VENTOLIN HFA) 108 (90 BASE) MCG/ACT inhaler Inhale 2 puffs into the lungs every 6 (six) hours as needed for wheezing.      Marland Kitchen. aspirin 81 MG tablet Take 81 mg by mouth daily.        Marland Kitchen. buPROPion (WELLBUTRIN XL) 300 MG 24 hr tablet Take 300 mg by mouth daily.        Marland Kitchen. EPINEPHrine (EPIPEN) 0.3  mg/0.3 mL SOAJ injection Inject 0.3 mg into the muscle as directed.      . ezetimibe (ZETIA) 10 MG tablet Take 10 mg by mouth daily.        Marland Kitchen. FLUoxetine (PROZAC) 40 MG capsule Take 80 mg by mouth daily.       Marland Kitchen. LORazepam (ATIVAN) 1 MG tablet Take 1 mg by mouth every 8 (eight) hours as needed for anxiety.      . metFORMIN (GLUCOPHAGE) 500 MG tablet Take 1 tablet (500 mg total) by mouth 2 (two) times daily with a meal.      . metoprolol (TOPROL-XL) 50 MG 24 hr tablet Take 1 tablet by mouth daily.       . mirtazapine (REMERON) 45 MG tablet Take 1 tab at night      . nitroGLYCERIN (NITROSTAT) 0.4 MG SL tablet Place 0.4 mg under the tongue every 5 (five) minutes as needed for chest pain.      Marland Kitchen. omeprazole (PRILOSEC) 40 MG capsule 1 tab daily      . rosuvastatin (CRESTOR) 40 MG tablet Take 40 mg by mouth daily.        Marland Kitchen. SINGULAIR 10 MG tablet Take 10 mg by  mouth daily.       . Ticagrelor (BRILINTA) 90 MG TABS tablet Take 1 tablet (90 mg total) by mouth 2 (two) times daily.  60 tablet  10   No current facility-administered medications for this visit.    Allergies  Allergen Reactions  . Dextrans     History   Social History  . Marital Status: Single    Spouse Name: N/A    Number of Children: 2  . Years of Education: N/A   Occupational History  .  [employer]   Social History Main Topics  . Smoking status: Never Smoker   . Smokeless tobacco: Never Used  . Alcohol Use: 0.5 oz/week    1 drink(s) per week  . Drug Use: No  . Sexual Activity: Not on file   Other Topics Concern  . Not on file   Social History Narrative   [place] . He works at the post office. He denies tobacco use, drinks alcohol 2-3 times per week, and uses energy supplements occasionally    Family History  Problem Relation Age of Onset  . Heart attack Father     age 74  . Coronary artery disease Brother     onset  CAD age 80    Review of Systems:  As stated in  the HPI and otherwise negative.   BP 130/80  Pulse 77  Ht 5\' 5"  (1.651 m)  Wt 159 lb (72.122 kg)  BMI 26.46 kg/m2  Physical Examination: General: Well developed, well nourished, NAD HEENT: OP clear, mucus membranes moist SKIN: warm, dry. No rashes. Neuro: No focal deficits Musculoskeletal: Muscle strength 5/5 all ext Psychiatric: Mood and affect normal Neck: No JVD, no carotid bruits, no thyromegaly, no lymphadenopathy. Lungs:Clear bilaterally, no wheezes, rhonci, crackles Cardiovascular: Regular rate and rhythm. No murmurs, gallops or rubs. Abdomen:Soft. Bowel sounds present. Non-tender.  Extremities: No lower extremity edema. Pulses are 2 + in the bilateral DP/PT.  EKG:   Echo 06/10/13: Left ventricle: The cavity size was normal. Wall thickness was increased in a pattern of mild LVH. Systolic function was vigorous. The estimated ejection fraction was in the range of 65% to 70%. Wall motion was normal; there were no regional wall motion abnormalities. - Atrial septum: No defect or patent foramen ovale was identified.  Cardiac cath 11/17/13: Left mainstem: 30% ostial  Left anterior descending (LAD): Diffuse disease in the LAD up to 30%. The first diagonal has 50% ostial disease.  Left circumflex (LCx): 30% distal disease at prior PTCA site. No other significant disease.  Right coronary artery (RCA): The stents in the mid RCA are widely patent. There is 30% disease in the distal portion of the more distal stent. There is a 90% stenosis in the distal RCA prior to the PDA.  Left ventriculography: Left ventricular systolic function is normal, LVEF is estimated at 55-65%, there is no significant mitral regurgitation  PCI Note: Following the diagnostic procedure, the decision was made to proceed with PCI of the distal RCA. Weight-based bivalirudin was given for anticoagulation. Brilinta 180 mg was given orally. Once a therapeutic ACT was achieved, a 6 [place] FL4 guide catheter was  inserted. A prowater coronary guidewire was used to cross the lesion. The lesion was predilated with a 2.5 mm balloon. The lesion was then stented with a 2.75 x 16 mm Promus stent. The stent was postdilated with a 3.0 mm noncompliant balloon. Following PCI, there was 0%  residual stenosis and TIMI-3 flow. Final angiography confirmed an excellent result. The patient tolerated the procedure well. There were no immediate procedural complications. A TR band was used for radial hemostasis. The patient was transferred to the post catheterization recovery area for further monitoring.  Assessment and Plan:   1. CAD: Stable s/p recent cath with DES placed distal RCA. Continue ASA/Brilinta, statin, beta blocker. Based on the results of the DAPT trial, will continue dual anti-platelet therapy for lifetime.   2. HTN: BP controlled. Continue current medications. No changes.   3. HLD: He is on a statin. Lipids followed in primary care and well controlled per patient.  Marland Kitchen

## 2013-12-24 NOTE — Telephone Encounter (Signed)
Error

## 2014-01-06 ENCOUNTER — Telehealth (HOSPITAL_COMMUNITY): Payer: Self-pay | Admitting: Cardiac Rehabilitation

## 2014-01-06 NOTE — Telephone Encounter (Signed)
pc to pt to further discuss enrolling in cardiac rehab.  Pt previously stated he had high insurance copay and might be interested in maintenance program. No answer with phone call today. Mailed pt letter including maintenance information.

## 2014-03-29 ENCOUNTER — Other Ambulatory Visit: Payer: Self-pay | Admitting: Cardiology

## 2014-05-25 ENCOUNTER — Encounter: Payer: Self-pay | Admitting: Cardiovascular Disease

## 2014-05-25 ENCOUNTER — Ambulatory Visit (INDEPENDENT_AMBULATORY_CARE_PROVIDER_SITE_OTHER): Payer: Federal, State, Local not specified - PPO | Admitting: Cardiovascular Disease

## 2014-05-25 VITALS — BP 112/72 | HR 75 | Ht 65.0 in | Wt 162.0 lb

## 2014-05-25 DIAGNOSIS — I10 Essential (primary) hypertension: Secondary | ICD-10-CM

## 2014-05-25 DIAGNOSIS — E785 Hyperlipidemia, unspecified: Secondary | ICD-10-CM

## 2014-05-25 DIAGNOSIS — I2510 Atherosclerotic heart disease of native coronary artery without angina pectoris: Secondary | ICD-10-CM

## 2014-05-25 MED ORDER — FAMOTIDINE 20 MG PO TABS: 20.0000 mg | ORAL_TABLET | Freq: Every day | ORAL | Status: DC

## 2014-05-25 MED ORDER — CLOPIDOGREL BISULFATE 75 MG PO TABS: 75.0000 mg | ORAL_TABLET | Freq: Every day | ORAL | Status: DC

## 2014-05-25 NOTE — Progress Notes (Signed)
History of Present Illness: 53 yo male with history of CAD, HTN, asthma here today for cardiac follow up. Alexander Calderon has been followed in the past by Dr. Riley Kill. Alexander Calderon CAD dates back to 2000 when Alexander Calderon had a bare metal stent placed in the RCA. Next cath 2002 with another bare metal stent RCA. Last cath 2011 with PTCA of small distal Circumflex. Last echo 2011 with LVEF=55-60%, mild LVH. Exercise treadmill stress test November 2014 without ischemia. Admitted with unstable angina 11/16/13 at [facility] and Cardiac cath 11/17/13 per Dr. Swaziland showed severe RCA stenosis. A 2.75 x 16 mm Promus DES was placed in the distal RCA.   Alexander Calderon is here today for follow up. No chest pain or SOB. No near syncope, syncope or dizziness. Alexander Calderon is feeling great. Not exercising.   Primary Care Physician: Leonette Most  Last Lipid Profile:  Lipid Panel     Component Value Date/Time   CHOL 141 11/17/2013 0255   TRIG 289* 11/17/2013 0255   HDL 43 11/17/2013 0255   CHOLHDL 3.3 11/17/2013 0255   VLDL 58* 11/17/2013 0255   LDLCALC 40 11/17/2013 0255    Past Medical History  Diagnosis Date  . Coronary artery disease     a. history of BMS to RCA in 2000 and again in 2002. b. PTCA of the distal LCx in 2011. c. [place] 11/2013: s/p DES to distal RCA, normal LV function (done from L radial approach as pt has history of  difficulty engaging the LCA and significant radial spasm from R radial approach).  . Hypertension   . Asthma   . Depression   . Hyperlipidemia   . Type 2 diabetes mellitus   . GERD (gastroesophageal reflux disease)     Past Surgical History  Procedure Laterality Date  . Appendectomy      Current Outpatient Prescriptions  Medication Sig Dispense Refill  . ADVAIR DISKUS 250-50 MCG/DOSE AEPB Inhale 1 puff into the lungs 2 (two) times daily.       Marland Kitchen albuterol (PROVENTIL HFA;VENTOLIN HFA) 108 (90 BASE) MCG/ACT inhaler Inhale 2 puffs into the lungs every 6 (six) hours as needed for wheezing.      Marland Kitchen aspirin 81 MG tablet Take 81 mg by  mouth daily.        Marland Kitchen BRILINTA 90 MG TABS tablet TAKE 1 TABLET BY MOUTH TWICE A DAY  60 tablet  6  . buPROPion (WELLBUTRIN XL) 300 MG 24 hr tablet Take 300 mg by mouth daily.        Marland Kitchen EPINEPHrine (EPIPEN) 0.3 mg/0.3 mL SOAJ injection Inject 0.3 mg into the muscle as directed.      . ezetimibe (ZETIA) 10 MG tablet Take 10 mg by mouth daily.        Marland Kitchen FLUoxetine (PROZAC) 40 MG capsule Take 80 mg by mouth daily.       . fluticasone (FLONASE) 50 MCG/ACT nasal spray       . gabapentin (NEURONTIN) 600 MG tablet       . LORazepam (ATIVAN) 1 MG tablet Take 1 mg by mouth every 8 (eight) hours as needed for anxiety.      . metFORMIN (GLUCOPHAGE) 500 MG tablet Take 1 tablet (500 mg total) by mouth 2 (two) times daily with a meal.      . metoprolol (TOPROL-XL) 50 MG 24 hr tablet Take 1 tablet by mouth daily.       . mirtazapine (REMERON) 45 MG tablet Take 1 tab at night      .  nitroGLYCERIN (NITROSTAT) 0.4 MG SL tablet Place 0.4 mg under the tongue every 5 (five) minutes as needed for chest pain.      Marland Kitchen omeprazole (PRILOSEC) 40 MG capsule 1 tab daily      . rosuvastatin (CRESTOR) 40 MG tablet Take 40 mg by mouth daily.        Marland Kitchen SINGULAIR 10 MG tablet Take 10 mg by mouth daily.        No current facility-administered medications for this visit.    Allergies  Allergen Reactions  . Dextrans     History   Social History  . Marital Status: Single    Spouse Name: N/A    Number of Children: 2  . Years of Education: N/A   Occupational History  .  [employer]   Social History Main Topics  . Smoking status: Never Smoker   . Smokeless tobacco: Never Used  . Alcohol Use: 0.5 oz/week    1 drink(s) per week  . Drug Use: No  . Sexual Activity: Not on file   Other Topics Concern  . Not on file   Social History Narrative   [place] Alexander Calderon wife . 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 Alexander Calderon denies tobacco use, drinks alcohol 2-3 times per week, and uses energy supplements  occasionally    Family History  Problem Relation Age of Onset  . Heart attack Father     age 46  . Coronary artery disease Brother     onset  CAD age 62    Review of Systems:  As stated in the HPI and otherwise negative.   BP 112/72  Pulse 75  Ht  (1.651 m)  Wt 162 lb (73.483 kg)  BMI 26.96 kg/m2  SpO2 98%  Physical Examination: General: Well developed, well nourished, NAD HEENT: OP clear, mucus membranes moist SKIN: warm, dry. No rashes. Neuro: No focal deficits Musculoskeletal: Muscle strength 5/5 all ext Psychiatric: Mood and affect normal Neck: No JVD, no carotid bruits, no thyromegaly, no lymphadenopathy. Lungs:Clear bilaterally, no wheezes, rhonci, crackles Cardiovascular: Regular rate and rhythm. No murmurs, gallops or rubs. Abdomen:Soft. Bowel sounds present. Non-tender.  Extremities: No lower extremity edema. Pulses are 2 + in the bilateral DP/PT.  Echo 06/10/13: Left ventricle: The cavity size was normal. Wall thickness was increased in a pattern of mild LVH. Systolic function was vigorous. The estimated ejection fraction was in the range of 65% to 70%. Wall motion was normal; there were no regional wall motion abnormalities. - Atrial septum: No defect or patent foramen ovale was identified.  Cardiac cath 11/17/13: Left mainstem: 30% ostial  Left anterior descending (LAD): Diffuse disease in the LAD up to 30%. The first diagonal has 50% ostial disease.  Left circumflex (LCx): 30% distal disease at prior PTCA site. No other significant disease.  Right coronary artery (RCA): The stents in the mid RCA are widely patent. There is 30% disease in the distal portion of the more distal stent. There is a 90% stenosis in the distal RCA prior to the PDA.  Left ventriculography: Left ventricular systolic function is normal, LVEF is estimated at 55-65%, there is no significant mitral regurgitation  PCI Note: Following the diagnostic procedure, the decision was made to  proceed with PCI of the distal RCA. Weight-based bivalirudin was given for anticoagulation. Brilinta 180 mg was given orally. Once a therapeutic ACT was achieved, a 6 [place] FL4 guide catheter was inserted. A prowater coronary guidewire  was used to cross the lesion. The lesion was predilated with a 2.5 mm balloon. The lesion was then stented with a 2.75 x 16 mm Promus stent. The stent was postdilated with a 3.0 mm noncompliant balloon. Following PCI, there was 0% residual stenosis and TIMI-3 flow. Final angiography confirmed an excellent result. The patient tolerated the procedure well. There were no immediate procedural complications. A TR band was used for radial hemostasis. The patient was transferred to the post catheterization recovery area for further monitoring.  Assessment and Plan:   1. CAD: Stable s/p recent cath March 2015 with DES placed distal RCA. Continue dual anti-platelet therapy but will change Brilinta to Plavix 75 mg po Qdaily due to cost. Continue statin, beta blocker. Will change PPI to Pepcid 20 mg daily since we are starting Plavix.   2. HTN: BP controlled. Continue current medications. No changes.   3. HLD: Alexander Calderon is on a statin. Lipids followed in primary care and well controlled per patient.  Marland Kitchen

## 2014-05-25 NOTE — Patient Instructions (Addendum)
Your physician wants you to follow-up in:  6 months. You will receive a reminder letter in the mail two months in advance. If you don't receive a letter, please call our office to schedule the follow-up appointment.  Your physician has recommended you make the following change in your medication:   Stop Brilinta. Start Clopidogrel 75 mg by mouth daily.  Stop Prilosec. Start Pepcid 20 mg by mouth daily. This is over the counter

## 2014-08-13 ENCOUNTER — Encounter (HOSPITAL_COMMUNITY): Payer: Self-pay | Admitting: Cardiology

## 2014-10-17 ENCOUNTER — Other Ambulatory Visit: Payer: Self-pay | Admitting: Cardiovascular Disease

## 2014-10-22 ENCOUNTER — Telehealth: Payer: Self-pay | Admitting: Cardiovascular Disease

## 2014-10-22 ENCOUNTER — Telehealth: Payer: Self-pay | Admitting: *Deleted

## 2014-10-22 NOTE — Telephone Encounter (Signed)
Pt calling to say [facility] to call last week per his request and didn't call  Until today and he is out, can he get refill called in asap??

## 2014-10-22 NOTE — Telephone Encounter (Signed)
I called [facility] to see if there were any refills left on Mr. Alexander Calderon's Clopidogrel (Plavix). Epic shows a 30 day with 11 refills as of 05/25/14. [facility] states that exact rx is still in the system with exact refills left and for some reason that rx has not been touched. He may have possibly had an old rx with refills left and that one may be out of refills. The pharmacy did refill the medication and apologized for the inconvenience.  I called the patient back to let him know he does indeed have refill left as he suspected, [facility] was sorry for the inconvenience, and his rx will be ready in the next 30 minutes for pick up at his earliest convenience. Patient said thank you and he will be switching pharmacies possibly soon due to having problems with getting rx refilled on time through [facility].

## 2014-11-04 ENCOUNTER — Encounter: Payer: Federal, State, Local not specified - PPO | Admitting: Cardiovascular Disease

## 2014-11-04 NOTE — Progress Notes (Signed)
No show

## 2014-11-23 ENCOUNTER — Encounter: Payer: Self-pay | Admitting: Physician Assistant

## 2014-11-23 ENCOUNTER — Ambulatory Visit (INDEPENDENT_AMBULATORY_CARE_PROVIDER_SITE_OTHER): Payer: Federal, State, Local not specified - PPO | Admitting: Physician Assistant

## 2014-11-23 VITALS — BP 140/82 | HR 74 | Ht 65.0 in | Wt 157.0 lb

## 2014-11-23 DIAGNOSIS — I10 Essential (primary) hypertension: Secondary | ICD-10-CM

## 2014-11-23 DIAGNOSIS — I2510 Atherosclerotic heart disease of native coronary artery without angina pectoris: Secondary | ICD-10-CM

## 2014-11-23 DIAGNOSIS — E785 Hyperlipidemia, unspecified: Secondary | ICD-10-CM

## 2014-11-23 NOTE — Progress Notes (Signed)
Cardiology Office Note   Date:  11/23/2014   ID:  Alexander AmourMichael Macaraeg, DOB 31-Aug-1961, MRN [id number]  PCP:  Sid FalconKALISH, Jarrin J, MD  Cardiologist:  Dr. Verne Carrowhristopher McAlhany     Chief Complaint  Patient presents with  . Coronary Artery Disease     History of Present Illness: Alexander Calderon is a 54 y.o. male with a hx of CAD status post PCI to the RCA in 2000 and 2002, angioplasty to the circumflex in 2011 and DES to the distal RCA in 2015. Last seen by Dr. Clifton JamesMcAlhany 05/2014.  He is doing well.  The patient denies chest pain, shortness of breath, syncope, orthopnea, PND or significant pedal edema.  He is tolerating dual antiplatelet Rx without bleeding issues.     Studies/Reports Reviewed Today:  Cardiac cath/PCI 11/17/13 Left mainstem: 30% ostial Left anterior descending (LAD): Diffuse disease in the LAD up to 30%. The first diagonal has 50% ostial disease.   Left circumflex (LCx): 30% distal disease at prior PTCA site. No other significant disease.  Right coronary artery (RCA): The stents in the mid RCA are widely patent. Distal 30% ISR.  Distal 90% prior to the PDA. Left ventriculography: EF  55-65%, there is no significant mitral regurgitation  PCI Note: Stent 2.75 x 16 mm Promus DES to distal RCA  Echo 06/2013 - Mild LVH. Systolic function was vigorous. EF 65-70%. Wall motion was normal - Atrial septum: No defect or patent foramen ovale was identified.   ETT 07/2013 ETT Interpretation: No significant ST depression at sub-maximal exercise   Past Medical History  Diagnosis Date  . Coronary artery disease     a. history of BMS to RCA in 2000 and again in 2002. b. PTCA of the distal LCx in 2011. c. [place] 11/2013: s/p DES to distal RCA, normal LV function (done from L radial approach as pt has history of  difficulty engaging the LCA and significant radial spasm from R radial approach).  . Hypertension   . Asthma   . Depression   . Hyperlipidemia   . Type 2 diabetes mellitus   .  GERD (gastroesophageal reflux disease)     Past Surgical History  Procedure Laterality Date  . Appendectomy    . Left heart catheterization with coronary angiogram N/A 11/17/2013    Procedure: LEFT HEART CATHETERIZATION WITH CORONARY ANGIOGRAM;  Surgeon: Peter M SwazilandJordan, MD;  [facility];  Service: Cardiovascular;  Laterality: N/A;     Current Outpatient Prescriptions  Medication Sig Dispense Refill  . albuterol (PROVENTIL HFA;VENTOLIN HFA) 108 (90 BASE) MCG/ACT inhaler Inhale 2 puffs into the lungs every 6 (six) hours as needed for wheezing.    Marland Kitchen. aspirin 81 MG tablet Take 81 mg by mouth daily.      Marland Kitchen. buPROPion (WELLBUTRIN XL) 300 MG 24 hr tablet Take 300 mg by mouth daily.      . clopidogrel (PLAVIX) 75 MG tablet Take 1 tablet (75 mg total) by mouth daily. 30 tablet 11  . EPINEPHrine (EPIPEN) 0.3 mg/0.3 mL SOAJ injection Inject 0.3 mg into the muscle as directed.    . ezetimibe (ZETIA) 10 MG tablet Take 10 mg by mouth daily.      . famotidine (PEPCID AC) 20 MG tablet Take 1 tablet (20 mg total) by mouth daily. 30 tablet 11  . FLUoxetine (PROZAC) 40 MG capsule Take 80 mg by mouth daily.     Marland Kitchen. LORazepam (ATIVAN) 1 MG tablet Take 1 mg by mouth every 8 (  eight) hours as needed for anxiety.    . metFORMIN (GLUCOPHAGE) 500 MG tablet Take 1 tablet (500 mg total) by mouth 2 (two) times daily with a meal.    . metoprolol (TOPROL-XL) 50 MG 24 hr tablet Take 1 tablet by mouth daily.     . mirtazapine (REMERON) 45 MG tablet Take 1 tab at night    . nitroGLYCERIN (NITROSTAT) 0.4 MG SL tablet Place 0.4 mg under the tongue every 5 (five) minutes as needed for chest pain.    . rosuvastatin (CRESTOR) 40 MG tablet Take 40 mg by mouth daily.      Marland Kitchen SINGULAIR 10 MG tablet Take 10 mg by mouth daily.      No current facility-administered medications for this visit.    Allergies:   Dextrans    Social History:  The patient  reports that he has never smoked. He has never used smokeless tobacco. He  reports that he drinks about 0.5 oz of alcohol per week. He reports that he does not use illicit drugs.   Family History:  The patient's family history includes Coronary artery disease in his brother; Heart attack in his father.    ROS:   Please see the history of present illness.   Review of Systems  HENT:       Rhinitis  Gastrointestinal: Negative for hematochezia and melena.  All other systems reviewed and are negative.    PHYSICAL EXAM: VS:  BP 140/82 mmHg  Pulse 74  Ht  (1.651 m)  Wt 157 lb (71.215 kg)  BMI 26.13 kg/m2    Wt Readings from Last 3 Encounters:  11/23/14 157 lb (71.215 kg)  05/25/14 162 lb (73.483 kg)  12/01/13 159 lb (72.122 kg)     GEN: Well nourished, well developed, in no acute distress HEENT: normal Neck: no JVD, no masses Cardiac:  Normal S1/S2, RRR; no murmur ,  no rubs or gallops, no edema  Respiratory:  clear to auscultation bilaterally, no wheezing, rhonchi or rales. GI: soft, nontender, nondistended, + BS MS: no deformity or atrophy Skin: warm and dry  Neuro:  CNs II-XII intact, Strength and sensation are intact Psych: Normal affect   EKG:  EKG is ordered today.  It demonstrates:   NSR, HR 74, normal axis, no ST changes.    Recent Labs: No results found for requested labs within last 365 days.    Lipid Panel    Component Value Date/Time   CHOL 141 11/17/2013 0255   TRIG 289* 11/17/2013 0255   HDL 43 11/17/2013 0255   CHOLHDL 3.3 11/17/2013 0255   VLDL 58* 11/17/2013 0255   LDLCALC 40 11/17/2013 0255      ASSESSMENT AND PLAN:  Coronary artery disease involving native coronary artery of native heart without angina pectoris No angina.  Given most recent trial data suggesting that dual antiplatelet Rx for longer than 12 mos is superior and the fact that he has had multiple PCIs, I have recommended he remain on ASA and Plavix.  He is tolerating them both and agrees.  Continue beta-blocker and statin.   Essential  hypertension BP elevated today. It is usually well controlled.  Continue to monitor.   Hyperlipidemia  Managed by PCP.    Current medicines are reviewed at length with the patient today.  The patient does not have concerns regarding medicines.  The following changes have been made:  no change  Labs/ tests ordered today include:  Orders Placed This Encounter  Procedures  .  EKG 12-Lead    Disposition:   FU with Dr. Verne Carrow 1 year.    Signed, Brynda Rim, MHS 11/23/2014 4:48 PM    [facility] [address] [place], [place], [place]  [address] Phone: [phone]; Fax: [phone]

## 2014-11-23 NOTE — Patient Instructions (Signed)
Schedule follow up with Dr. Verne Carrowhristopher McAlhany in 1 year. Continue all of your medications the same. You can try Zyrtec instead of Benadryl to see if this helps your allergies. You can also use Flonase nasal spray (it is available over the counter).

## 2014-11-23 NOTE — Progress Notes (Deleted)
Cardiology Office Note   Date:  11/23/2014   ID:  Alexander Calderon, DOB May 30, 1961, MRN [id number]  PCP:  Sid Falcon, MD  Cardiologist:  Anesthesia Physical   Electrophysiologist:  ***  No chief complaint on file.    History of Present Illness: Alexander Calderon is a 54 y.o. male with a hx of CAD s/p stenting of RCA with BMS in 2000 and 2002 and POBA of dist LCx in 2011.   Studies/Reports Reviewed Today:  Cardiac cath/PCI 11/17/13 Left mainstem: 30% ostial Left anterior descending (LAD): Diffuse disease in the LAD up to 30%. The first diagonal has 50% ostial disease.   Left circumflex (LCx): 30% distal disease at prior PTCA site. No other significant disease.  Right coronary artery (RCA): The stents in the mid RCA are widely patent. Distal 30% ISR.  Distal 90% prior to the PDA. Left ventriculography: EF  55-65%, there is no significant mitral regurgitation  PCI Note: Stent 2.75 x 16 mm Promus DES to distal RCA  Echo 06/2013 - Mild LVH. Systolic function was vigorous. EF 65-70%. Wall motion was normal - Atrial septum: No defect or patent foramen ovale was identified.   ETT 07/2013 ETT Interpretation: No significant ST depression at sub-maximal exercise  Past Medical History  Diagnosis Date  . Coronary artery disease     a. history of BMS to RCA in 2000 and again in 2002. b. PTCA of the distal LCx in 2011. c. [place] 11/2013: s/p DES to distal RCA, normal LV function (done from L radial approach as pt has history of  difficulty engaging the LCA and significant radial spasm from R radial approach).  . Hypertension   . Asthma   . Depression   . Hyperlipidemia   . Type 2 diabetes mellitus   . GERD (gastroesophageal reflux disease)     Past Surgical History  Procedure Laterality Date  . Appendectomy    . Left heart catheterization with coronary angiogram N/A 11/17/2013    Procedure: LEFT HEART CATHETERIZATION WITH CORONARY ANGIOGRAM;  Surgeon: Peter M Swaziland, MD;  Location: [facility]  CATH LAB;  Service: Cardiovascular;  Laterality: N/A;     Current Outpatient Prescriptions  Medication Sig Dispense Refill  . ADVAIR DISKUS 250-50 MCG/DOSE AEPB Inhale 1 puff into the lungs 2 (two) times daily.     Marland Kitchen albuterol (PROVENTIL HFA;VENTOLIN HFA) 108 (90 BASE) MCG/ACT inhaler Inhale 2 puffs into the lungs every 6 (six) hours as needed for wheezing.    Marland Kitchen aspirin 81 MG tablet Take 81 mg by mouth daily.      Marland Kitchen buPROPion (WELLBUTRIN XL) 300 MG 24 hr tablet Take 300 mg by mouth daily.      . clopidogrel (PLAVIX) 75 MG tablet Take 1 tablet (75 mg total) by mouth daily. 30 tablet 11  . EPINEPHrine (EPIPEN) 0.3 mg/0.3 mL SOAJ injection Inject 0.3 mg into the muscle as directed.    . ezetimibe (ZETIA) 10 MG tablet Take 10 mg by mouth daily.      . famotidine (PEPCID AC) 20 MG tablet Take 1 tablet (20 mg total) by mouth daily. 30 tablet 11  . FLUoxetine (PROZAC) 40 MG capsule Take 80 mg by mouth daily.     . fluticasone (FLONASE) 50 MCG/ACT nasal spray     . gabapentin (NEURONTIN) 600 MG tablet     . LORazepam (ATIVAN) 1 MG tablet Take 1 mg by mouth every 8 (eight) hours as needed for anxiety.    . metFORMIN (GLUCOPHAGE)  500 MG tablet Take 1 tablet (500 mg total) by mouth 2 (two) times daily with a meal.    . metoprolol (TOPROL-XL) 50 MG 24 hr tablet Take 1 tablet by mouth daily.     . mirtazapine (REMERON) 45 MG tablet Take 1 tab at night    . nitroGLYCERIN (NITROSTAT) 0.4 MG SL tablet Place 0.4 mg under the tongue every 5 (five) minutes as needed for chest pain.    . rosuvastatin (CRESTOR) 40 MG tablet Take 40 mg by mouth daily.      Marland Kitchen. SINGULAIR 10 MG tablet Take 10 mg by mouth daily.      No current facility-administered medications for this visit.    Allergies:   Dextrans    Social History:  The patient  reports that he has never smoked. He has never used smokeless tobacco. He reports that he drinks about 0.5 oz of alcohol per week. He reports that he does not use illicit drugs.    Family History:  The patient's ***family history includes Coronary artery disease in his brother; Heart attack in his father.    ROS:   Please see the history of present illness.   ROS    PHYSICAL EXAM: VS:  There were no vitals taken for this visit.    Wt Readings from Last 3 Encounters:  05/25/14 162 lb (73.483 kg)  12/01/13 159 lb (72.122 kg)  11/17/13 156 lb 8.4 oz (71 kg)     GEN: Well nourished, well developed, in no acute distress HEENT: normal Neck: *** JVD, ***carotid bruits, no masses Cardiac:  Normal S1/S2, ***RRR; *** murmur ***, *** no rubs or gallops, {NUMBERS; 1+ TO 4+, TRACE/RARE:14493} edema  Respiratory:  ***clear to auscultation bilaterally, no wheezing, rhonchi or rales. GI: ***soft, nontender, nondistended, + BS MS: no deformity or atrophy Skin: warm and dry  Neuro:  CNs II-XII intact, Strength and sensation are intact Psych: Normal affect   EKG:  EKG {ACTION; IS/IS ZOX:[id number]}OT:21021397} ordered today.  It demonstrates:   ***   Recent Labs: No results found for requested labs within last 365 days.    Lipid Panel    Component Value Date/Time   CHOL 141 11/17/2013 0255   TRIG 289* 11/17/2013 0255   HDL 43 11/17/2013 0255   CHOLHDL 3.3 11/17/2013 0255   VLDL 58* 11/17/2013 0255   LDLCALC 40 11/17/2013 0255      ASSESSMENT AND PLAN:  No diagnosis found. ***   Current medicines are reviewed at length with the patient today.  The patient {ACTIONS; HAS/DOES NOT HAVE:19233} concerns regarding medicines.  The following changes have been made:  {PLAN; NO CHANGE:13088:s}  Labs/ tests ordered today include: *** No orders of the defined types were placed in this encounter.     Disposition:   FU with ***   Signed, Tereso NewcomerScott Tinya Cadogan, PA-C, MHS 11/23/2014 2:09 PM    [facility] [address], [place], [place]  [address] Phone: [phone]; Fax: [phone]

## 2015-04-07 ENCOUNTER — Other Ambulatory Visit: Payer: Self-pay

## 2015-04-07 MED ORDER — NITROGLYCERIN 0.4 MG SL SUBL: 0.4000 mg | SUBLINGUAL_TABLET | SUBLINGUAL | Status: DC | PRN

## 2015-06-21 ENCOUNTER — Other Ambulatory Visit: Payer: Self-pay | Admitting: Cardiovascular Disease

## 2016-01-01 ENCOUNTER — Other Ambulatory Visit: Payer: Self-pay | Admitting: Cardiovascular Disease

## 2016-02-25 ENCOUNTER — Encounter: Payer: Self-pay | Admitting: Cardiovascular Disease

## 2016-02-25 ENCOUNTER — Ambulatory Visit (INDEPENDENT_AMBULATORY_CARE_PROVIDER_SITE_OTHER): Payer: Federal, State, Local not specified - PPO | Admitting: Cardiovascular Disease

## 2016-02-25 VITALS — BP 114/78 | HR 78 | Ht 65.0 in | Wt 160.0 lb

## 2016-02-25 DIAGNOSIS — I10 Essential (primary) hypertension: Secondary | ICD-10-CM

## 2016-02-25 DIAGNOSIS — E785 Hyperlipidemia, unspecified: Secondary | ICD-10-CM

## 2016-02-25 DIAGNOSIS — I2510 Atherosclerotic heart disease of native coronary artery without angina pectoris: Secondary | ICD-10-CM

## 2016-02-25 NOTE — Progress Notes (Signed)
Chief Complaint  Patient presents with  . Follow-up    1 year      History of Present Illness: 55 yo male with history of CAD, HTN, asthma here today for cardiac follow up. He has been followed in the past by Dr. Riley KillStuckey. His CAD dates back to 2000 when he had a bare metal stent placed in the RCA. Next cath 2002 with another bare metal stent RCA. Exercise treadmill stress test November 2014 without ischemia. [facility] with [facility] and cardiac cath 11/17/13 per Dr. SwazilandJordan showed severe RCA stenosis. A 2.75 x 16 mm Promus DES was placed in the distal RCA.   He is here today for follow up. No chest pain or SOB. No near syncope, syncope or dizziness. He is feeling great. Not exercising.   Primary Care Physician: Sid FalconKALISH, Celso J, MD   Past Medical History  Diagnosis Date  . Coronary artery disease     a. history of BMS to RCA in 2000 and again in 2002. b. PTCA of the distal LCx in 2011. c. [place] 11/2013: s/p DES to distal RCA, normal LV function (done from L radial approach as pt has history of  difficulty engaging the LCA and significant radial spasm from R radial approach).  . Hypertension   . Asthma   . Depression   . Hyperlipidemia   . Type 2 diabetes mellitus (HCC)   . GERD (gastroesophageal reflux disease)     Past Surgical History  Procedure Laterality Date  . Appendectomy    . Left heart catheterization with coronary angiogram N/A 11/17/2013    Procedure: LEFT HEART CATHETERIZATION WITH CORONARY ANGIOGRAM;  Surgeon: Peter M SwazilandJordan, MD;  [facility];  Service: Cardiovascular;  Laterality: N/A;    Current Outpatient Prescriptions  Medication Sig Dispense Refill  . albuterol (PROVENTIL HFA;VENTOLIN HFA) 108 (90 BASE) MCG/ACT inhaler Inhale 2 puffs into the lungs every 6 (six) hours as needed for wheezing.    Marland Kitchen. aspirin 81 MG tablet Take 81 mg by mouth daily.      Marland Kitchen. buPROPion (WELLBUTRIN XL) 300 MG 24 hr tablet Take 300 mg by mouth daily.      .  clopidogrel (PLAVIX) 75 MG tablet Take ONE (1) tablet by mouth once daily 30 tablet 0  . EPINEPHrine (EPIPEN) 0.3 mg/0.3 mL SOAJ injection Inject 0.3 mg into the muscle as directed.    . ezetimibe (ZETIA) 10 MG tablet Take 10 mg by mouth daily.      . famotidine (PEPCID AC) 20 MG tablet Take 1 tablet (20 mg total) by mouth daily. 30 tablet 11  . FLUoxetine (PROZAC) 40 MG capsule Take 80 mg by mouth daily.     Marland Kitchen. LORazepam (ATIVAN) 1 MG tablet Take 1 mg by mouth every 8 (eight) hours as needed for anxiety.    . metFORMIN (GLUCOPHAGE-XR) 500 MG 24 hr tablet Take 4 tablets by mouth daily.    . metoprolol (TOPROL-XL) 50 MG 24 hr tablet Take 1 tablet by mouth daily.     . mirtazapine (REMERON) 45 MG tablet Take 1 tab at night    . nitroGLYCERIN (NITROSTAT) 0.4 MG SL tablet Place 1 tablet (0.4 mg total) under the tongue every 5 (five) minutes as needed for chest pain. 25 tablet 2  . omeprazole (PRILOSEC) 40 MG capsule Take 40 mg by mouth daily.    . rosuvastatin (CRESTOR) 40 MG tablet Take 40 mg by mouth daily.      .Marland Kitchen  SINGULAIR 10 MG tablet Take 10 mg by mouth daily.      No current facility-administered medications for this visit.    Allergies  Allergen Reactions  . Dextrans Nausea Only    Social History   Social History  . Marital Status: Single    Spouse Name: N/A  . Number of Children: 2  . Years of Education: N/A   Occupational History  .  [employer]   Social History Main Topics  . Smoking status: Never Smoker   . Smokeless tobacco: Never Used  . Alcohol Use: 0.5 oz/week    1 drink(s) per week  . Drug Use: No  . Sexual Activity: Not on file   Other Topics Concern  . Not on file   Social History Narrative   The [place] with his wife . He works at the post office. He denies tobacco use, drinks alcohol 2-3 times per week, and uses energy supplements occasionally    Family History  Problem Relation Age of Onset  . Heart attack Father      age 53  . Coronary artery disease Brother     onset  CAD age 53    Review of Systems:  As stated in the HPI and otherwise negative.   BP 114/78 mmHg  Pulse 78  Ht 5\' 5"  (1.651 m)  Wt 160 lb (72.576 kg)  BMI 26.63 kg/m2  Physical Examination: General: Well developed, well nourished, NAD HEENT: OP clear, mucus membranes moist SKIN: warm, dry. No rashes. Neuro: No focal deficits Musculoskeletal: Muscle strength 5/5 all ext Psychiatric: Mood and affect normal Neck: No JVD, no carotid bruits, no thyromegaly, no lymphadenopathy. Lungs:Clear bilaterally, no wheezes, rhonci, crackles Cardiovascular: Regular rate and rhythm. No murmurs, gallops or rubs. Abdomen:Soft. Bowel sounds present. Non-tender.  Extremities: No lower extremity edema. Pulses are 2 + in the bilateral DP/PT.  Echo 06/10/13: Left ventricle: The cavity size was normal. Wall thickness was increased in a pattern of mild LVH. Systolic function was vigorous. The estimated ejection fraction was in the range of 65% to 70%. Wall motion was normal; there were no regional wall motion abnormalities. - Atrial septum: No defect or patent foramen ovale was identified.  Cardiac cath 11/17/13: Left mainstem: 30% ostial  Left anterior descending (LAD): Diffuse disease in the LAD up to 30%. The first diagonal has 50% ostial disease.  Left circumflex (LCx): 30% distal disease at prior PTCA site. No other significant disease.  Right coronary artery (RCA): The stents in the mid RCA are widely patent. There is 30% disease in the distal portion of the more distal stent. There is a 90% stenosis in the distal RCA prior to the PDA.  Left ventriculography: Left ventricular systolic function is normal, LVEF is estimated at 55-65%, there is no significant mitral regurgitation  PCI Note: Following the diagnostic procedure, the decision was made to proceed with PCI of the distal RCA. Weight-based bivalirudin was given for anticoagulation. Brilinta  180 mg was given orally. Once a therapeutic ACT was achieved, a 6 [place] FL4 guide catheter was inserted. A prowater coronary guidewire was used to cross the lesion. The lesion was predilated with a 2.5 mm balloon. The lesion was then stented with a 2.75 x 16 mm Promus stent. The stent was postdilated with a 3.0 mm noncompliant balloon. Following PCI, there was 0% residual stenosis and TIMI-3 flow. Final angiography confirmed an excellent result. The patient tolerated the procedure well. There were no  immediate procedural complications. A TR band was used for radial hemostasis. The patient was transferred to the post catheterization recovery area for further monitoring.  EKG:  EKG is ordered today. The ekg ordered today demonstrates NSR, rate 78 bpm.   Recent Labs: No results found for requested labs within last 365 days.   Lipid Panel    Component Value Date/Time   CHOL 141 11/17/2013 0255   TRIG 289* 11/17/2013 0255   HDL 43 11/17/2013 0255   CHOLHDL 3.3 11/17/2013 0255   VLDL 58* 11/17/2013 0255   LDLCALC 40 11/17/2013 0255     Wt Readings from Last 3 Encounters:  02/25/16 160 lb (72.576 kg)  11/23/14 157 lb (71.215 kg)  05/25/14 162 lb (73.483 kg)     Other studies Reviewed: Additional studies/ records that were reviewed today include: . Review of the above records demonstrates:    Assessment and Plan:   1. CAD: Stable s/p cath March 2015 with DES placed distal RCA. Continue dual anti-platelet therapy with Plavix 75 mg po Qdaily and ASA. Continue statin, beta blocker.   2. HTN: BP controlled. Continue current medications. No changes.   3. HLD: He is on a statin. Lipids followed in primary care and well controlled per patient.   Current medicines are reviewed at length with the patient today.  The patient does not have concerns regarding medicines.  The following changes have been made:  no change  Labs/ tests ordered today include:   Orders Placed This Encounter    Procedures  . EKG 12-Lead     Disposition:   FU with me in 12  months   Signed, Verne Carrow, MD 02/25/2016 4:31 PM    [facility] [address] [place], [place], [place]  [address] Phone: [phone]; Fax: [phone]    .

## 2016-02-25 NOTE — Patient Instructions (Signed)

## 2016-04-14 ENCOUNTER — Other Ambulatory Visit: Payer: Self-pay | Admitting: Cardiovascular Disease

## 2016-06-01 ENCOUNTER — Other Ambulatory Visit: Payer: Self-pay | Admitting: Cardiovascular Disease

## 2017-01-25 ENCOUNTER — Other Ambulatory Visit: Payer: Self-pay | Admitting: Cardiovascular Disease

## 2017-01-25 MED ORDER — CLOPIDOGREL BISULFATE 75 MG PO TABS: 75.0000 mg | ORAL_TABLET | Freq: Every day | ORAL | 0 refills | Status: DC

## 2017-03-05 ENCOUNTER — Other Ambulatory Visit: Payer: Self-pay | Admitting: *Deleted

## 2017-03-05 MED ORDER — CLOPIDOGREL BISULFATE 75 MG PO TABS: 75.0000 mg | ORAL_TABLET | Freq: Every day | ORAL | 0 refills | Status: DC

## 2017-03-06 ENCOUNTER — Other Ambulatory Visit: Payer: Self-pay

## 2017-03-06 ENCOUNTER — Telehealth: Payer: Self-pay | Admitting: Cardiovascular Disease

## 2017-03-06 MED ORDER — CLOPIDOGREL BISULFATE 75 MG PO TABS
75.0000 mg | ORAL_TABLET | Freq: Every day | ORAL | 0 refills | Status: DC
Start: 2017-03-06 — End: 2017-03-08

## 2017-03-06 NOTE — Telephone Encounter (Signed)
New Message   *STAT* If patient is at the pharmacy, call can be transferred to refill team.   1. Which medications need to be refilled? (please list name of each medication and dose if known) Plavix 75mg    2. Which pharmacy/location (including street and city if local pharmacy) is medication to be sent to? [facility] [place]   3. Do they need a 30 day or 90 day supply? 90

## 2017-03-08 MED ORDER — CLOPIDOGREL BISULFATE 75 MG PO TABS: 75.0000 mg | ORAL_TABLET | Freq: Every day | ORAL | 0 refills | Status: DC

## 2017-03-08 NOTE — Telephone Encounter (Signed)
F/u message  Pt wife call to f/u on medication refill request. Please call back to discuss

## 2017-03-08 NOTE — Addendum Note (Signed)
Addended by: Alvin CritchleyBARNARD, CATHY C on: 03/08/2017 01:00 PM   Modules accepted: Orders

## 2017-05-09 ENCOUNTER — Ambulatory Visit: Payer: Federal, State, Local not specified - PPO | Admitting: Cardiovascular Disease

## 2017-05-27 NOTE — Progress Notes (Signed)
Chief Complaint  Patient presents with  . Follow-up    CAD      History of Present Illness: 56 yo male with history of CAD, HTN, asthma here today for cardiac follow up. His CAD dates back to 2000 when he had a bare metal stent placed in the RCA. Cardiac cath in 2002 with another bare metal stent placed in the RCA. He was admitted with unstable angina 11/16/13 at [facility] and cardiac cath 11/17/13 showed severe distal RCA stenosis which was treated with a Promus drug eluting stent.    He is here today for follow up of his CAD. The patient denies any chest pain, dyspnea, palpitations, lower extremity edema, orthopnea, PND, dizziness, near syncope or syncope.    Primary Care Physician: Loyal Jacobson, MD   Past Medical History:  Diagnosis Date  . Asthma   . Coronary artery disease    a. history of BMS to RCA in 2000 and again in 2002. b. PTCA of the distal LCx in 2011. c. [place] 11/2013: s/p DES to distal RCA, normal LV function (done from L radial approach as pt has history of  difficulty engaging the LCA and significant radial spasm from R radial approach).  . Depression   . GERD (gastroesophageal reflux disease)   . Hyperlipidemia   . Hypertension   . Type 2 diabetes mellitus (HCC)     Past Surgical History:  Procedure Laterality Date  . APPENDECTOMY    . LEFT HEART CATHETERIZATION WITH CORONARY ANGIOGRAM N/A 11/17/2013   Procedure: LEFT HEART CATHETERIZATION WITH CORONARY ANGIOGRAM;  Surgeon: Peter M Swaziland, MD;  Location: [facility] CATH LAB;  Service: Cardiovascular;  Laterality: N/A;    Current Outpatient Prescriptions  Medication Sig Dispense Refill  . albuterol (PROVENTIL HFA;VENTOLIN HFA) 108 (90 BASE) MCG/ACT inhaler Inhale 2 puffs into the lungs every 6 (six) hours as needed for wheezing.    Marland Kitchen aspirin 81 MG tablet Take 81 mg by mouth daily.      Marland Kitchen buPROPion (WELLBUTRIN XL) 300 MG 24 hr tablet Take 300 mg by mouth daily.      . clopidogrel (PLAVIX) 75 MG tablet Take 1 tablet (75 mg  total) by mouth daily. 30 tablet 11  . EPINEPHrine (EPIPEN) 0.3 mg/0.3 mL SOAJ injection Inject 0.3 mg into the muscle as directed.    . ezetimibe (ZETIA) 10 MG tablet Take 10 mg by mouth daily.      . famotidine (PEPCID AC) 20 MG tablet Take 1 tablet (20 mg total) by mouth daily. 30 tablet 11  . FLUoxetine (PROZAC) 40 MG capsule Take 80 mg by mouth daily.     Marland Kitchen LORazepam (ATIVAN) 1 MG tablet Take 1 mg by mouth every 8 (eight) hours as needed for anxiety.    . metFORMIN (GLUCOPHAGE-XR) 500 MG 24 hr tablet Take 4 tablets by mouth daily.    . metoprolol (TOPROL-XL) 50 MG 24 hr tablet Take 1 tablet by mouth daily.     . mirtazapine (REMERON) 45 MG tablet Take 1 tab at night    . NITROSTAT 0.4 MG SL tablet PLACE 1 TABLET UNDER TONGUE EVERY 5 MINUTES AS NEEDED 25 tablet 4  . omeprazole (PRILOSEC) 40 MG capsule Take 40 mg by mouth daily.    . rosuvastatin (CRESTOR) 40 MG tablet Take 40 mg by mouth daily.      . sildenafil (REVATIO) 20 MG tablet Take 40-100 mg by mouth as needed.    Marland Kitchen SINGULAIR 10 MG tablet Take 10  mg by mouth daily.      No current facility-administered medications for this visit.     Allergies  Allergen Reactions  . Dextrans Nausea Only    Social History   Social History  . Marital status: Single    Spouse name: N/A  . Number of children: 2  . Years of education: N/A   Occupational History  .  [employer]   Social History Main Topics  . Smoking status: Never Smoker  . Smokeless tobacco: Never Used  . Alcohol use 0.5 oz/week    1 drink(s) per week  . Drug use: No  . Sexual activity: Not on file   Other Topics Concern  . Not on file   Social History Narrative   The [place] his [place] . He works at the post office. He denies tobacco use, drinks alcohol 2-3 times per week, and uses energy supplements occasionally    Family History  Problem Relation Age of Onset  . Heart attack Father        age 21  . Coronary artery  disease Brother        onset  CAD age 55    Review of Systems:  As stated in the HPI and otherwise negative.   BP 118/70   Pulse 73   Ht  (1.651 m)   Wt 160 lb 12.8 oz (72.9 kg)   SpO2 96%   BMI 26.76 kg/m   Physical Examination:  General: Well developed, well nourished, NAD  HEENT: OP clear, mucus membranes moist  SKIN: warm, dry. No rashes. Neuro: No focal deficits  Musculoskeletal: Muscle strength 5/5 all ext  Psychiatric: Mood and affect normal  Neck: No JVD, no carotid bruits, no thyromegaly, no lymphadenopathy.  Lungs:Clear bilaterally, no wheezes, rhonci, crackles Cardiovascular: Regular rate and rhythm. No murmurs, gallops or rubs. Abdomen:Soft. Bowel sounds present. Non-tender.  Extremities: No lower extremity edema. Pulses are 2 + in the bilateral DP/PT.  Echo 06/10/13: Left ventricle: The cavity size was normal. Wall thickness was increased in a pattern of mild LVH. Systolic function was vigorous. The estimated ejection fraction was in the range of 65% to 70%. Wall motion was normal; there were no regional wall motion abnormalities. - Atrial septum: No defect or patent foramen ovale was identified.  Cardiac cath 11/17/13: Left mainstem: 30% ostial  Left anterior descending (LAD): Diffuse disease in the LAD up to 30%. The first diagonal has 50% ostial disease.  Left circumflex (LCx): 30% distal disease at prior PTCA site. No other significant disease.  Right coronary artery (RCA): The stents in the mid RCA are widely patent. There is 30% disease in the distal portion of the more distal stent. There is a 90% stenosis in the distal RCA prior to the PDA.  Left ventriculography: Left ventricular systolic function is normal, LVEF is estimated at 55-65%, there is no significant mitral regurgitation  PCI Note: Following the diagnostic procedure, the decision was made to proceed with PCI of the distal RCA. Weight-based bivalirudin was given for anticoagulation.  Brilinta 180 mg was given orally. Once a therapeutic ACT was achieved, a 6 [place] FL4 guide catheter was inserted. A prowater coronary guidewire was used to cross the lesion. The lesion was predilated with a 2.5 mm balloon. The lesion was then stented with a 2.75 x 16 mm Promus stent. The stent was postdilated with a 3.0 mm noncompliant balloon. Following PCI, there was 0% residual stenosis and  TIMI-3 flow. Final angiography confirmed an excellent result. The patient tolerated the procedure well. There were no immediate procedural complications. A TR band was used for radial hemostasis. The patient was transferred to the post catheterization recovery area for further monitoring.  EKG:  EKG is  ordered today. The ekg ordered today demonstrates NSR, rate 68 bpm  Recent Labs: No results found for requested labs within last 8760 hours.   Lipid Panel    Component Value Date/Time   CHOL 141 11/17/2013 0255   TRIG 289 (H) 11/17/2013 0255   HDL 43 11/17/2013 0255   CHOLHDL 3.3 11/17/2013 0255   VLDL 58 (H) 11/17/2013 0255   LDLCALC 40 11/17/2013 0255     Wt Readings from Last 3 Encounters:  05/28/17 160 lb 12.8 oz (72.9 kg)  02/25/16 160 lb (72.6 kg)  11/23/14 157 lb (71.2 kg)     Other studies Reviewed: Additional studies/ records that were reviewed today include: . Review of the above records demonstrates:    Assessment and Plan:   1. CAD without angina: He has no chest pain suggestive of angina. Will continue ASA, Plavix, statin and beta blocker.    2. HTN: BP controlled. No changes.   3. HLD: Lipids followed in primary care. LDL 48 in March 2018. Continue statin.    Current medicines are reviewed at length with the patient today.  The patient does not have concerns regarding medicines.  The following changes have been made:  no change  Labs/ tests ordered today include:   Orders Placed This Encounter  Procedures  . EKG 12-Lead     Disposition:   FU with me in 12   months   Signed, Verne Carrow, MD 05/28/2017 9:31 AM    [facility] [address] [place], [place], [place]  [address] Phone: [phone]; Fax: [phone]    .

## 2017-05-28 ENCOUNTER — Ambulatory Visit (INDEPENDENT_AMBULATORY_CARE_PROVIDER_SITE_OTHER): Payer: Federal, State, Local not specified - PPO | Admitting: Cardiovascular Disease

## 2017-05-28 ENCOUNTER — Encounter: Payer: Self-pay | Admitting: Cardiovascular Disease

## 2017-05-28 VITALS — BP 118/70 | HR 73 | Ht 65.0 in | Wt 160.8 lb

## 2017-05-28 DIAGNOSIS — I10 Essential (primary) hypertension: Secondary | ICD-10-CM | POA: Diagnosis not present

## 2017-05-28 DIAGNOSIS — E7800 Pure hypercholesterolemia, unspecified: Secondary | ICD-10-CM

## 2017-05-28 DIAGNOSIS — I2510 Atherosclerotic heart disease of native coronary artery without angina pectoris: Secondary | ICD-10-CM | POA: Diagnosis not present

## 2017-05-28 MED ORDER — CLOPIDOGREL BISULFATE 75 MG PO TABS: 75.0000 mg | ORAL_TABLET | Freq: Every day | ORAL | 11 refills | Status: DC

## 2017-05-28 NOTE — Patient Instructions (Signed)

## 2017-06-21 ENCOUNTER — Other Ambulatory Visit: Payer: Self-pay | Admitting: Cardiovascular Disease

## 2017-06-21 NOTE — Telephone Encounter (Signed)
Medication Detail    Disp Refills Start End   clopidogrel (PLAVIX) 75 MG tablet 30 tablet 11 05/28/2017    Sig - Route: Take 1 tablet (75 mg total) by mouth daily. - Oral   Sent to pharmacy as: clopidogrel (PLAVIX) 75 MG tablet   E-Prescribing Status: Receipt confirmed by pharmacy (05/28/2017 9:22 AM EDT)   Pharmacy   [facility] [place], [place] - [id number]

## 2017-08-06 ENCOUNTER — Telehealth: Payer: Self-pay | Admitting: *Deleted

## 2017-08-06 NOTE — Telephone Encounter (Signed)
   [facility] Pre-operative Risk Assessment    Request for surgical clearance:  1. What type of surgery is being performed? Colonoscopy/EGD  2. When is this surgery scheduled? No date noted  3. Are there any medications that need to be held prior to surgery and how long? Plavix -7 days prior to procedure  4. Practice name and name of physician performing surgery? Dr Rolm Bookbinder.  [facility]   5. What is your office phone and fax number?  Phone-[phone]   6. Anesthesia type (None, local, MAC, general) ? None noted on request form   _________________________________________________________________   (provider comments below)

## 2017-08-07 NOTE — Telephone Encounter (Signed)
   Primary Cardiologist: Verne Carrowhristopher McAlhany, MD  Chart reviewed as part of pre-operative protocol coverage. Patient was contacted 08/07/2017 in reference to pre-operative risk assessment for pending surgery as outlined below.  Alexander Calderon was last seen on 05/28/17 by Dr. Clifton JamesMcAlhany and was doing well with 1 year f/u planned. RCRI calculated at 0.9% indicating low risk of major cardiac event with surgery.  Since that day, Alexander Calderon has done very well. Able to walk, climb stairs, mow yard, do housework without any angina - achieves 8.23 METS.   Therefore, based on ACC/AHA guidelines, the patient would be at acceptable risk for the planned procedure without further cardiovascular testing.   Will forward to Dr. Clifton JamesMcAlhany to get his input on interruption of Plavix prior to colonoscopy - GI requesting to hold 7 days prior. Dr. Clifton JamesMcAlhany, please route reply to P CV DIV PREOP.  Once this info is known, will need to forward bundled note to GI.  Laurann Montanaayna N Zigmond Trela, PA-C 08/07/2017, 4:40 PM

## 2017-08-08 NOTE — Telephone Encounter (Signed)
OK to hold Plavix 7 days before planned procedure.   Alexander Calderon

## 2017-08-17 ENCOUNTER — Telehealth: Payer: Self-pay | Admitting: *Deleted

## 2017-08-17 NOTE — Telephone Encounter (Signed)
   [facility] Pre-operative Risk Assessment    Request for surgical clearance:  1. What type of surgery is being performed? NOT LISTED ON SURG FORM   2. When is this surgery scheduled? NO DATE YET   3. Are there any medications that need to be held prior to surgery and how long? PLAVIX, ASA    4. Practice name and name of physician performing surgery? [facility], DR. BADREDDINE   5. What is your office phone and fax number? PH# [phone], FAX # [phone]   7. Anesthesia type (None, local, MAC, general) ?    Julaine Hua 08/17/2017, 5:05 PM  _________________________________________________________________   (provider comments below)

## 2017-08-20 NOTE — Telephone Encounter (Signed)
Spoke with Melissa @ [facility] re: cardiac clearance. She will have medical records resend the fax clearance over with all the detailed information on it.

## 2017-08-20 NOTE — Telephone Encounter (Signed)
Will remove from pool until correct clearance has been faxed and re-entered. Anhelica Fowers PA-C

## 2017-08-20 NOTE — Telephone Encounter (Signed)
   Chart reviewed as part of pre-operative protocol coverage. Callback staff, please obtain information missing from clearance request.  Laurann Montanaayna N Dunn, PA-C 08/20/2017, 3:12 PM

## 2017-08-21 ENCOUNTER — Telehealth: Payer: Self-pay | Admitting: Nurse Practitioner

## 2017-08-21 NOTE — Telephone Encounter (Signed)
   [facility] Pre-operative Risk Assessment    Request for surgical clearance:  What type of surgery is being performed? COLONOSCOPY/ENDOSCOPY When is this surgery scheduled? TBD 1. Are there any medications that need to be held prior to surgery and how long? PLAVIX, ASA    2. Practice name and name of physician performing surgery? [facility], DR. BADREDDINE   3. What is your office phone and fax number? PH# [phone], FAX # [phone]   4. Anesthesia type (None, local, MAC, general) PROPOFOL   Julaine Hua 08/21/2017, 5:27 PM  _________________________________________________________________   (provider comments below)

## 2017-08-21 NOTE — Telephone Encounter (Signed)
I have called [facility] to get complete information as to pt's upcoming procedure. S/w receptionist who will have surgery coordinator call me with the information.

## 2017-08-21 NOTE — Telephone Encounter (Signed)
   Primary Cardiologist: Verne Carrowhristopher McAlhany, MD  Chart reviewed as part of pre-operative protocol coverage. Given past medical history and time since last visit, based on ACC/AHA guidelines, Melchor AmourMichael Kenna would be at acceptable risk for the planned procedure without further cardiovascular testing.  He may hold plavix 5 days prior to procedure.  Ideally, he will remain on ASA,  blocker, and statin therapy throughout the peri-procedural period.   Please call with questions.  Nicolasa Duckinghristopher Beckett Maden, NP 08/21/2017, 5:55 PM

## 2017-09-03 ENCOUNTER — Telehealth: Payer: Self-pay | Admitting: Cardiovascular Disease

## 2017-09-03 NOTE — Telephone Encounter (Signed)
New message     Patient wife is calling , surgeon called her and said that they have not received surgical clearance back.... Could you refax the clearance again?

## 2017-09-03 NOTE — Telephone Encounter (Signed)
Returned call to pt / pt wife.  Left a detailed message that we have contacted [facility] and noone answered the phone, but we have refaxed the clearance to [phone].

## 2017-09-03 NOTE — Telephone Encounter (Signed)
From phone note 08/21/17, will refax to [phone] attn Jessica.   Primary Cardiologist: Verne Carrowhristopher McAlhany, MD  Chart reviewed as part of pre-operative protocol coverage. Given past medical history and time since last visit, based on ACC/AHA guidelines, Alexander Calderon would be at acceptable risk for the planned procedure without further cardiovascular testing.  He may hold plavix 5 days prior to procedure.  Ideally, he will remain on ASA, ? blocker, and statin therapy throughout the peri-procedural period.   Please call with questions.  Alexander Duckinghristopher Berge, NP 08/21/2017, 5:55 PM

## 2017-09-03 NOTE — Telephone Encounter (Signed)
New Message  Patient is calling back about release that is to be sent to a Gastroenterologist. Please call.

## 2017-09-03 NOTE — Telephone Encounter (Signed)
Called [facility], their offices are currently closed. Will refax clearance to [phone].

## 2017-09-03 NOTE — Telephone Encounter (Signed)
Shanda BumpsJessica with [facility] calling, states that she hasn't received fax yet. She asks that we fax clearance form to [phone] and put "ATTN Jessica."

## 2017-09-06 NOTE — Telephone Encounter (Signed)
Returned call to pt and he has been made aware that [facility] Pt thanked me for the call.

## 2017-09-07 NOTE — Telephone Encounter (Signed)
Mrs. Alexander Calderon is returning your call because Mr. Alexander Calderon is unable to take the call at work . Please call her at [phone]

## 2017-09-07 NOTE — Telephone Encounter (Signed)
F/u Message  Pt wife call requesting to speak with RN to f/u on surgical clearance and if it was sent.

## 2017-09-07 NOTE — Telephone Encounter (Signed)
Returned call to pt and left a message for pt to call back.  

## 2017-09-10 NOTE — Telephone Encounter (Signed)
Returned pts wife call, DPR on file, and have left her a detailed message that pts cardiac clearance has been [facility] to [facility] and if she had further questions, to call us back.

## 2018-02-04 ENCOUNTER — Other Ambulatory Visit: Payer: Self-pay | Admitting: Cardiovascular Disease

## 2018-06-04 ENCOUNTER — Other Ambulatory Visit: Payer: Self-pay | Admitting: Cardiovascular Disease

## 2018-07-08 ENCOUNTER — Other Ambulatory Visit: Payer: Self-pay | Admitting: Cardiovascular Disease

## 2018-07-23 ENCOUNTER — Other Ambulatory Visit: Payer: Self-pay | Admitting: Cardiovascular Disease

## 2018-08-07 ENCOUNTER — Telehealth: Payer: Self-pay | Admitting: Cardiovascular Disease

## 2018-08-07 NOTE — Telephone Encounter (Signed)
New Message:   Patient wife calling trying to get appt for her husband to get a refill on medication. Patient needs to get a appt. Please his wife.

## 2018-08-07 NOTE — Telephone Encounter (Signed)
I spoke with pt's wife and offered appt with Dr. Clifton JamesMcAlhany on 12/6.  Pt is unable to come in at that time.Wife reports it is difficult for pt to schedule appointment in December due to his work schedule.  I scheduled pt to see Jacolyn ReedyMichele Lenze, PA on January 8,2020 at 10:00.

## 2018-08-10 ENCOUNTER — Other Ambulatory Visit: Payer: Self-pay | Admitting: Cardiovascular Disease

## 2018-08-12 ENCOUNTER — Other Ambulatory Visit: Payer: Self-pay | Admitting: Cardiovascular Disease

## 2018-08-12 MED ORDER — CLOPIDOGREL BISULFATE 75 MG PO TABS: 75.0000 mg | ORAL_TABLET | Freq: Every day | ORAL | 1 refills | Status: DC

## 2018-09-10 NOTE — Progress Notes (Signed)
Cardiology Office Note    Date:  09/11/2018   ID:  Alexander Calderon, DOB 06-May-1961, MRN [id number]  PCP:  Loyal Jacobson, MD  Cardiologist: Verne Carrow, MD EPS: None  Chief Complaint  Patient presents with  . Follow-up    History of Present Illness:  Alexander Calderon is a 58 y.o. male with history of CAD status post BMS to the RCA 2000, another BMS to the RCA in 2002.  Unstable angina 11/16/2013 with severe distal RCA restenosis treated with Promus drug-eluting stent to the RCA.  Also has hypertension and asthma.  Last saw Dr. Clifton James 05/2017 was doing well.  Patient denies chest pain, dyspnea, dyspnea on exertion, dizziness or presyncope. Working 2 jobs at [employer] and [employer] and walks a lot. No exercise outside of that. Was placed on iron for blood in stool. Endo and colonoscopy were ok.  Lipids checked by PCP in September triglycerides 173 LDL 58 on Crestor and Zetia.    Past Medical History:  Diagnosis Date  . Asthma   . Coronary artery disease    a. history of BMS to RCA in 2000 and again in 2002. b. PTCA of the distal LCx in 2011. c. [place] 11/2013: s/p DES to distal RCA, normal LV function (done from L radial approach as pt has history of  difficulty engaging the LCA and significant radial spasm from R radial approach).  . Depression   . GERD (gastroesophageal reflux disease)   . Hyperlipidemia   . Hypertension   . Type 2 diabetes mellitus (HCC)     Past Surgical History:  Procedure Laterality Date  . APPENDECTOMY    . LEFT HEART CATHETERIZATION WITH CORONARY ANGIOGRAM N/A 11/17/2013   Procedure: LEFT HEART CATHETERIZATION WITH CORONARY ANGIOGRAM;  Surgeon: Peter M Swaziland, MD;  Location: [facility] CATH LAB;  Service: Cardiovascular;  Laterality: N/A;    Current Medications: Current Meds  Medication Sig  . albuterol (PROVENTIL HFA;VENTOLIN HFA) 108 (90 BASE) MCG/ACT inhaler Inhale 2 puffs into the lungs every 6 (six) hours as needed for wheezing.  Marland Kitchen aspirin 81 MG  tablet Take 81 mg by mouth daily.    Marland Kitchen buPROPion (WELLBUTRIN XL) 300 MG 24 hr tablet Take 300 mg by mouth daily.    . citalopram (CELEXA) 40 MG tablet Take 40 mg by mouth daily.  . clopidogrel (PLAVIX) 75 MG tablet Take 1 tablet (75 mg total) by mouth daily. Please keep upcoming appt in January for future refills. Thank you  . EPINEPHrine (EPIPEN) 0.3 mg/0.3 mL SOAJ injection Inject 0.3 mg into the muscle as directed.  . ezetimibe (ZETIA) 10 MG tablet Take 10 mg by mouth daily.    Marland Kitchen LORazepam (ATIVAN) 1 MG tablet Take 1 mg by mouth every 8 (eight) hours as needed for anxiety.  . metFORMIN (GLUCOPHAGE-XR) 500 MG 24 hr tablet Take 2 tablets by mouth daily.   . metoprolol (TOPROL-XL) 50 MG 24 hr tablet Take 1 tablet by mouth daily.   . mirtazapine (REMERON) 45 MG tablet Take 1 tab at night  . nitroGLYCERIN (NITROSTAT) 0.4 MG SL tablet PLACE 1 TABLET UNDER TONGUE EVERY 5 MINUTES AS NEEDED  . omeprazole (PRILOSEC) 40 MG capsule Take 40 mg by mouth daily.  . rosuvastatin (CRESTOR) 40 MG tablet Take 40 mg by mouth daily.    . sildenafil (REVATIO) 20 MG tablet Take 40-100 mg by mouth as needed.  Marland Kitchen SINGULAIR 10 MG tablet Take 10 mg by mouth daily.  Allergies:   Dextrans   Social History   Socioeconomic History  . Marital status: Single    Spouse name: Not on file  . Number of children: 2  . Years of education: Not on file  . Highest education level: Not on file  Occupational History    Employer: [employer]  Social Needs  . Financial resource strain: Not on file  . Food insecurity:    Worry: Not on file    Inability: Not on file  . Transportation needs:    Medical: Not on file    Non-medical: Not on file  Tobacco Use  . Smoking status: Never Smoker  . Smokeless tobacco: Never Used  Substance and Sexual Activity  . Alcohol use: Yes    Alcohol/week: 1.0 standard drinks    Types: 1 drink(s) per week  . Drug use: No  . Sexual activity: Not on file  Lifestyle  . Physical  activity:    Days per week: Not on file    Minutes per session: Not on file  . Stress: Not on file  Relationships  . Social connections:    Talks on phone: Not on file    Gets together: Not on file    Attends religious service: Not on file    Active member of club or organization: Not on file    Attends meetings of clubs or organizations: Not on file    Relationship status: Not on file  Other Topics Concern  . Not on file  Social History Narrative   [place] . He works at the post [employer]. He denies tobacco use, drinks alcohol 2-3 times per week, and uses energy supplements occasionally     Family History:  The patient's family history includes Coronary artery disease in his brother; Heart attack in his father.   ROS:   Please see the history of present illness.    Review of Systems  Constitution: Negative.  HENT: Negative.   Cardiovascular: Negative.   Respiratory: Negative.   Endocrine: Negative.   Hematologic/Lymphatic: Negative.   Musculoskeletal: Negative.   Gastrointestinal: Negative.   Genitourinary: Negative.   Neurological: Negative.   Psychiatric/Behavioral: Positive for depression. The patient is nervous/anxious.    All other systems reviewed and are negative.   PHYSICAL EXAM:   VS:  BP 108/62   Pulse 72   Ht 5\' 5"  (1.651 m)   Wt 155 lb 6.4 oz (70.5 kg)   SpO2 97%   BMI 25.86 kg/m   Physical Exam  GEN: Well nourished, well developed, in no acute distress  Neck: no JVD, carotid bruits, or masses Cardiac:RRR; no murmurs, rubs, or gallops  Respiratory:  clear to auscultation bilaterally, normal work of breathing GI: soft, nontender, nondistended, + BS Ext: without cyanosis, clubbing, or edema, Good distal pulses bilaterally Neuro:  Alert and Oriented x 3 Psych: euthymic mood, full affect  Wt Readings from Last 3 Encounters:  09/11/18 155 lb 6.4 oz (70.5 kg)  05/28/17 160 lb 12.8 oz (72.9 kg)  02/25/16 160 lb  (72.6 kg)      Studies/Labs Reviewed:   EKG:  EKG is ordered today.  The ekg ordered today demonstrates normal sinus rhythm normal EKG  Recent Labs: No results found for requested labs within last 8760 hours.   Lipid Panel    Component Value Date/Time   CHOL 141 11/17/2013 0255   TRIG 289 (H) 11/17/2013 0255   HDL 43 11/17/2013  0255   CHOLHDL 3.3 11/17/2013 0255   VLDL 58 (H) 11/17/2013 0255   LDLCALC 40 11/17/2013 0255    Additional studies/ records that were reviewed today include:  Echo 06/10/13: Left ventricle: The cavity size was normal. Wall thickness was increased in a pattern of mild LVH. Systolic function was vigorous. The estimated ejection fraction was in the range of 65% to 70%. Wall motion was normal; there were no regional wall motion abnormalities. - Atrial septum: No defect or patent foramen ovale was identified.   Cardiac cath 11/17/13: Left mainstem: 30% ostial  Left anterior descending (LAD): Diffuse disease in the LAD up to 30%. The first diagonal has 50% ostial disease.  Left circumflex (LCx): 30% distal disease at prior PTCA site. No other significant disease.  Right coronary artery (RCA): The stents in the mid RCA are widely patent. There is 30% disease in the distal portion of the more distal stent. There is a 90% stenosis in the distal RCA prior to the PDA.  Left ventriculography: Left ventricular systolic function is normal, LVEF is estimated at 55-65%, there is no significant mitral regurgitation  PCI Note: Following the diagnostic procedure, the decision was made to proceed with PCI of the distal RCA. Weight-based bivalirudin was given for anticoagulation. Brilinta 180 mg was given orally. Once a therapeutic ACT was achieved, a 6 [place] FL4 guide catheter was inserted. A prowater coronary guidewire was used to cross the lesion. The lesion was predilated with a 2.5 mm balloon. The lesion was then stented with a 2.75 x 16 mm Promus stent. The stent was  postdilated with a 3.0 mm noncompliant balloon. Following PCI, there was 0% residual stenosis and TIMI-3 flow. Final angiography confirmed an excellent result. The patient tolerated the procedure well. There were no immediate procedural complications. A TR band was used for radial hemostasis. The patient was transferred to the post catheterization recovery area for further monitoring.       ASSESSMENT:    1. Coronary artery disease involving native coronary artery of native heart without angina pectoris   2. Essential hypertension   3. Hyperlipidemia, unspecified hyperlipidemia type      PLAN:  In order of problems listed above:  CAD status post BMS to the RCA in 2000, BMS to the RCA in 2002, DES to the distal RCA 11/16/2013-doing well without angina.  Continue Plavix and aspirin.  Follow-up with Dr. Clifton JamesMcAlhany in 1 year.  Hypertension blood pressure well controlled on Toprol  Hyperlipidemia LDL 58 triglycerides 173 on Zetia and Crestor.  We will try adding Vascepa to see if this will bring down his triglycerides.  Repeat lipids and LFTs in 3 months   Medication Adjustments/Labs and Tests Ordered: Current medicines are reviewed at length with the patient today.  Concerns regarding medicines are outlined above.  Medication changes, Labs and Tests ordered today are listed in the Patient Instructions below. Patient Instructions  Medication Instructions:  Your physician has recommended you make the following change in your medication:   START: vascepa 2 gram by mouth twice a day   If you need a refill on your cardiac medications before your next appointment, please call your pharmacy.   Lab work: Your physician recommends that you return for a FASTING lipid profile and liver function panel in 3 months   If you have labs (blood work) drawn today and your tests are completely normal, you will receive your results only by: Marland Kitchen. MyChart Message (if you have MyChart) OR . A  paper copy in  the mail If you have any lab test that is abnormal or we need to change your treatment, we will call you to review the results.  Testing/Procedures: None ordered  Follow-Up: At [facility], you and your health needs are our priority.  As part of our continuing mission to provide you with exceptional heart care, we have created designated Provider Care Teams.  These Care Teams include your primary Cardiologist (physician) and Advanced Practice Providers (APPs -  Physician Assistants and Nurse Practitioners) who all work together to provide you with the care you need, when you need it. . You will need a follow up appointment in 1 year.  Please call our office 2 months in advance to schedule this appointment.  You may see Earney Hamburghris McAlhany, MD or one of the following Advanced Practice Providers on your designated Care Team:   . Robbie LisBrittainy Simmons, PA-C . Dayna Dunn, PA-C . Jacolyn Reedy , PA-C  Any Other Special Instructions Will Be Listed Below (If Applicable).       Elson ClanSigned,  , PA-C  09/11/2018 10:23 AM    [facility] [address], [place], [place]  [address] Phone: [phone]; Fax: [phone]

## 2018-09-11 ENCOUNTER — Ambulatory Visit: Payer: Federal, State, Local not specified - PPO | Admitting: Physician Assistant

## 2018-09-11 ENCOUNTER — Encounter: Payer: Self-pay | Admitting: Physician Assistant

## 2018-09-11 ENCOUNTER — Encounter (INDEPENDENT_AMBULATORY_CARE_PROVIDER_SITE_OTHER): Payer: Self-pay

## 2018-09-11 VITALS — BP 108/62 | HR 72 | Ht 65.0 in | Wt 155.4 lb

## 2018-09-11 DIAGNOSIS — I10 Essential (primary) hypertension: Secondary | ICD-10-CM

## 2018-09-11 DIAGNOSIS — I2510 Atherosclerotic heart disease of native coronary artery without angina pectoris: Secondary | ICD-10-CM

## 2018-09-11 DIAGNOSIS — E785 Hyperlipidemia, unspecified: Secondary | ICD-10-CM

## 2018-09-11 MED ORDER — ICOSAPENT ETHYL 1 G PO CAPS: 2.0000 g | ORAL_CAPSULE | Freq: Two times a day (BID) | ORAL | 3 refills | Status: DC

## 2018-09-11 NOTE — Patient Instructions (Addendum)
Medication Instructions:  Your physician has recommended you make the following change in your medication:   START: vascepa 2 gram by mouth twice a day   If you need a refill on your cardiac medications before your next appointment, please call your pharmacy.   Lab work: Your physician recommends that you return for a FASTING lipid profile and liver function panel in 3 months   If you have labs (blood work) drawn today and your tests are completely normal, you will receive your results only by: Marland Kitchen MyChart Message (if you have MyChart) OR . A paper copy in the mail If you have any lab test that is abnormal or we need to change your treatment, we will call you to review the results.  Testing/Procedures: None ordered  Follow-Up: At [facility], you and your health needs are our priority.  As part of our continuing mission to provide you with exceptional heart care, we have created designated Provider Care Teams.  These Care Teams include your primary Cardiologist (physician) and Advanced Practice Providers (APPs -  Physician Assistants and Nurse Practitioners) who all work together to provide you with the care you need, when you need it. . You will need a follow up appointment in 1 year.  Please call our office 2 months in advance to schedule this appointment.  You may see Earney Hamburg, MD or one of the following Advanced Practice Providers on your designated Care Team:   . Robbie Lis, PA-C . Dayna Dunn, PA-C . Jacolyn Reedy, PA-C  Any Other Special Instructions Will Be Listed Below (If Applicable).

## 2018-10-18 ENCOUNTER — Other Ambulatory Visit: Payer: Self-pay | Admitting: Cardiovascular Disease

## 2019-03-07 ENCOUNTER — Other Ambulatory Visit: Payer: Self-pay | Admitting: Cardiovascular Disease

## 2019-09-09 NOTE — Progress Notes (Signed)
Cardiology Office Note    Date:  09/10/2019   ID:  Alexander Calderon, DOB 12-Jul-1961, MRN [id number]  PCP:  Loyal Jacobson, MD  Cardiologist:  Dr. Clifton James  Chief Complaint: 12 Months follow up  History of Present Illness:   Alexander Calderon is a 59 y.o. male with hx of CAD, HTN, HLD and DM seen for yearly follow up.   Hx of CAD s/p BMS to RCA 2000 and another BMS to RCA in 2002. Unstable angina 11/16/2013 with severe distal RCA restenosis treated with Promus drug-eluting stent to the RCA. Since then he is doing well on DAPT with ASA and Plavix. Last seen by APP on 09/2018.  Here today for follow up. No regular exercise but he is active at job. The patient denies nausea, vomiting, fever, chest pain, palpitations, shortness of breath, orthopnea, PND, dizziness, syncope, cough, congestion, abdominal pain, hematochezia, melena, lower extremity edema.  Past Medical History:  Diagnosis Date  . Asthma   . Coronary artery disease    a. history of BMS to RCA in 2000 and again in 2002. b. PTCA of the distal LCx in 2011. c. [place] 11/2013: s/p DES to distal RCA, normal LV function (done from L radial approach as pt has history of  difficulty engaging the LCA and significant radial spasm from R radial approach).  . Depression   . GERD (gastroesophageal reflux disease)   . Hyperlipidemia   . Hypertension   . Type 2 diabetes mellitus (HCC)     Past Surgical History:  Procedure Laterality Date  . APPENDECTOMY    . LEFT HEART CATHETERIZATION WITH CORONARY ANGIOGRAM N/A 11/17/2013   Procedure: LEFT HEART CATHETERIZATION WITH CORONARY ANGIOGRAM;  Surgeon: Peter M [place], MD;  Location: [facility] CATH LAB;  Service: Cardiovascular;  Laterality: N/A;    Current Medications: Prior to Admission medications   Medication Sig Start Date End Date Taking? Authorizing Provider  albuterol (PROVENTIL HFA;VENTOLIN HFA) 108 (90 BASE) MCG/ACT inhaler Inhale 2 puffs into the lungs every 6 (six) hours as needed for wheezing.     [provider]  aspirin 81 MG tablet Take 81 mg by mouth daily.      [provider]  buPROPion (WELLBUTRIN XL) 300 MG 24 hr tablet Take 300 mg by mouth daily.      [provider]  citalopram (CELEXA) 40 MG tablet Take 40 mg by mouth daily.    [provider]  clopidogrel (PLAVIX) 75 MG tablet TAKE ONE (1) TABLET BY MOUTH EVERY DAY 10/18/18   Kathleene Hazel, MD  EPINEPHrine (EPIPEN) 0.3 mg/0.3 mL SOAJ injection Inject 0.3 mg into the muscle as directed.    [provider]  ezetimibe (ZETIA) 10 MG tablet Take 10 mg by mouth daily.      [provider]  Icosapent Ethyl (VASCEPA) 1 g CAPS Take 2 capsules (2 g total) by mouth 2 (two) times daily. 09/11/18 09/06/19  Dyann Kief, PA-C  LORazepam (ATIVAN) 1 MG tablet Take 1 mg by mouth every 8 (eight) hours as needed for anxiety.    [provider]  metFORMIN (GLUCOPHAGE-XR) 500 MG 24 hr tablet Take 2 tablets by mouth daily.  01/11/16   [provider]  metoprolol (TOPROL-XL) 50 MG 24 hr tablet Take 1 tablet by mouth daily.  02/21/11   [provider]  mirtazapine (REMERON) 45 MG tablet Take 1 tab at night 11/08/13   [provider]  nitroGLYCERIN (NITROSTAT) 0.4 MG SL tablet DISSOLVE 1  TABLET UNDER TONGUE EVERY 5 MINUTES FOR UP TO 3 DOSES AS NEEDED FOR CHEST PAIN 03/10/19   Kathleene Hazel, MD  omeprazole (PRILOSEC) 40 MG capsule Take 40 mg by mouth daily. 01/11/16   [provider]  rosuvastatin (CRESTOR) 40 MG tablet Take 40 mg by mouth daily.      [provider]  sildenafil (REVATIO) 20 MG tablet Take 40-100 mg by mouth as needed. 05/16/17   [provider]  SINGULAIR 10 MG tablet Take 10 mg by mouth daily.  02/14/11   [provider]   Allergies:   Dextrans   Social History   Socioeconomic History  . Marital status: Single    Spouse name: Not on file  . Number of children: 2  . Years of education: Not on file   . Highest education level: Not on file  Occupational History    Employer: [employer]  Tobacco Use  . Smoking status: Never Smoker  . Smokeless tobacco: Never Used  Substance and Sexual Activity  . Alcohol use: Yes    Alcohol/week: 1.0 standard drinks    Types: 1 drink(s) per week  . Drug use: No  . Sexual activity: Not on file  Other Topics Concern  . Not on file  Social History Narrative   [place] . He works at the [employer]. He denies tobacco use, drinks alcohol 2-3 times per week, and uses energy supplements occasionally   Social Determinants of Health   Financial Resource Strain:   . Difficulty of Paying Living Expenses: Not on file  Food Insecurity:   . Worried About Programme researcher, broadcasting/film/video in the Last Year: Not on file  . Ran Out of Food in the Last Year: Not on file  Transportation Needs:   . Lack of Transportation (Medical): Not on file  . Lack of Transportation (Non-Medical): Not on file  Physical Activity:   . Days of Exercise per Week: Not on file  . Minutes of Exercise per Session: Not on file  Stress:   . Feeling of Stress : Not on file  Social Connections:   . Frequency of Communication with Friends and Family: Not on file  . Frequency of Social Gatherings with Friends and Family: Not on file  . Attends Religious Services: Not on file  . Active Member of Clubs or Organizations: Not on file  . Attends Banker Meetings: Not on file  . Marital Status: Not on file     Family History:  The patient's family history includes Coronary artery disease in his brother; Heart attack in his father.  ROS:   Please see the history of present illness.    ROS All other systems reviewed and are negative.   PHYSICAL EXAM:   VS:  BP 124/68   Pulse 68   Ht 5\' 5"  (1.651 m)   Wt 149 lb 12 oz (67.9 kg)   SpO2 98%   BMI 24.92 kg/m    GEN: Well nourished, well developed, in no acute distress  HEENT: normal   Neck: no JVD, carotid bruits, or masses Cardiac: RRR; no murmurs, rubs, or gallops,no edema  Respiratory:  clear to auscultation bilaterally, normal work of breathing GI: soft, nontender, nondistended, + BS MS: no deformity or atrophy  Skin: warm and dry, no rash Neuro:  Alert and Oriented x 3, Strength and sensation are intact Psych: euthymic mood, full affect  Wt Readings from  Last 3 Encounters:  09/10/19 149 lb 12 oz (67.9 kg)  09/11/18 155 lb 6.4 oz (70.5 kg)  05/28/17 160 lb 12.8 oz (72.9 kg)      Studies/Labs Reviewed:   EKG:  EKG is ordered today.  The ekg ordered today demonstrates NSR at rate of 68 bpm Recent Labs: No results found for requested labs within last 8760 hours.   Lipid Panel    Component Value Date/Time   CHOL 141 11/17/2013 0255   TRIG 289 (H) 11/17/2013 0255   HDL 43 11/17/2013 0255   CHOLHDL 3.3 11/17/2013 0255   VLDL 58 (H) 11/17/2013 0255   LDLCALC 40 11/17/2013 0255    Additional studies/ records that were reviewed today include:   Echocardiogram: 06/2013 Left ventricle: The cavity size was normal. Wall thickness  was increased in a pattern of mild LVH. Systolic function  was vigorous. The estimated ejection fraction was in the  range of 65% to 70%. Wall motion was normal; there were no  regional wall motion abnormalities.  - Atrial septum: No defect or patent foramen ovale was  identified.   Cardiac Catheterization: 11/2013 [facility] FINDINGS Hemodynamics: AO 116/67 mean 89 mm Hg LV 118/24 mm Hg              Coronary angiography: Coronary dominance: right  Left mainstem: 30% ostial  Left anterior descending (LAD): Diffuse disease in the LAD up to 30%. The first diagonal has 50% ostial disease.   Left circumflex (LCx): 30% distal disease at prior PTCA site. No other significant disease.   Right coronary artery (RCA): The stents in the mid RCA are widely patent. There is 30% disease in the distal portion of the more  distal stent. There is a 90% stenosis in the distal RCA prior to the PDA.  Left ventriculography: Left ventricular systolic function is normal, LVEF is estimated at 55-65%, there is no significant mitral regurgitation   PCI Note:  Following the diagnostic procedure, the decision was made to proceed with PCI of the distal RCA.  Weight-based bivalirudin was given for anticoagulation. Brilinta 180 mg was given orally. Once a therapeutic ACT was achieved, a 6 [place] FL4 guide catheter was inserted.  A prowater coronary guidewire was used to cross the lesion.  The lesion was predilated with a 2.5 mm balloon.  The lesion was then stented with a 2.75 x 16 mm Promus stent.  The stent was postdilated with a 3.0 mm noncompliant balloon.  Following PCI, there was 0% residual stenosis and TIMI-3 flow. Final angiography confirmed an excellent result. The patient tolerated the procedure well. There were no immediate procedural complications. A TR band was used for radial hemostasis. The patient was transferred to the post catheterization recovery area for further monitoring.  PCI Data: Vessel - RCA/Segment - distal Percent Stenosis (pre)  90% TIMI-flow 3 Stent 2.75 x 16 mm Promus Percent Stenosis (post) 0% TIMI-flow (post) 3  Final Conclusions:   1. Single vessel obstructive CAD with denovo lesion in the distal RCA. 2. Normal LV function. 3. Successful stenting of the distal RCA with DES.   Recommendations:  Continue dual antiplatelet therapy for one year.   ASSESSMENT & PLAN:    1. CAD No angina. Continue ASA, Plavix, statin and BB.   2. HTN - Stable and controlled on BB.   3. HLD - Patient reports followed by PCP every six months. No change in therapy. He will send copy for records.   4. DM - Per PCP  Medication Adjustments/Labs and Tests Ordered: Current medicines are reviewed at length with the patient today.  Concerns regarding medicines are outlined above.  Medication changes, Labs  and Tests ordered today are listed in the Patient Instructions below. Patient Instructions  Medication Instructions:   *If you need a refill on your cardiac medications before your next appointment, please call your pharmacy*  Lab Work:  If you have labs (blood work) drawn today and your tests are completely normal, you will receive your results only by: Marland Kitchen MyChart Message (if you have MyChart) OR . A paper copy in the mail If you have any lab test that is abnormal or we need to change your treatment, we will call you to review the results.  Testing/Procedures: None ordered  Follow-Up: At [facility], you and your health needs are our priority.  As part of our continuing mission to provide you with exceptional heart care, we have created designated Provider Care Teams.  These Care Teams include your primary Cardiologist (physician) and Advanced Practice Providers (APPs -  Physician Assistants and Nurse Practitioners) who all work together to provide you with the care you need, when you need it.  Your next appointment:   1 year(s)  The format for your next appointment:   Either In Person or Virtual  Provider:   You may see Lauree Chandler, MD or one of the following Advanced Practice Providers on your designated Care Team:    Melina Copa, PA-C  Ermalinda Barrios, PA-C     Signed, [place], [place]  09/10/2019 8:59 AM    [address], [place], [place]  [address] Phone: [phone]; Fax: [phone]

## 2019-09-10 ENCOUNTER — Encounter: Payer: Self-pay | Admitting: Physician Assistant

## 2019-09-10 ENCOUNTER — Other Ambulatory Visit: Payer: Self-pay

## 2019-09-10 ENCOUNTER — Ambulatory Visit: Payer: Federal, State, Local not specified - PPO | Admitting: Physician Assistant

## 2019-09-10 ENCOUNTER — Encounter (INDEPENDENT_AMBULATORY_CARE_PROVIDER_SITE_OTHER): Payer: Self-pay

## 2019-09-10 VITALS — BP 124/68 | HR 68 | Ht 65.0 in | Wt 149.8 lb

## 2019-09-10 DIAGNOSIS — E119 Type 2 diabetes mellitus without complications: Secondary | ICD-10-CM | POA: Diagnosis not present

## 2019-09-10 DIAGNOSIS — E785 Hyperlipidemia, unspecified: Secondary | ICD-10-CM | POA: Diagnosis not present

## 2019-09-10 DIAGNOSIS — I10 Essential (primary) hypertension: Secondary | ICD-10-CM

## 2019-09-10 DIAGNOSIS — I2510 Atherosclerotic heart disease of native coronary artery without angina pectoris: Secondary | ICD-10-CM

## 2019-09-10 DIAGNOSIS — Z794 Long term (current) use of insulin: Secondary | ICD-10-CM

## 2019-09-10 MED ORDER — CLOPIDOGREL BISULFATE 75 MG PO TABS: ORAL_TABLET | ORAL | 3 refills | Status: DC

## 2019-09-10 NOTE — Patient Instructions (Signed)
Medication Instructions:   *If you need a refill on your cardiac medications before your next appointment, please call your pharmacy*  Lab Work:  If you have labs (blood work) drawn today and your tests are completely normal, you will receive your results only by: Marland Kitchen MyChart Message (if you have MyChart) OR . A paper copy in the mail If you have any lab test that is abnormal or we need to change your treatment, we will call you to review the results.  Testing/Procedures: None ordered  Follow-Up: At [facility], you and your health needs are our priority.  As part of our continuing mission to provide you with exceptional heart care, we have created designated Provider Care Teams.  These Care Teams include your primary Cardiologist (physician) and Advanced Practice Providers (APPs -  Physician Assistants and Nurse Practitioners) who all work together to provide you with the care you need, when you need it.  Your next appointment:   1 year(s)  The format for your next appointment:   Either In Person or Virtual  Provider:   You may see Verne Carrow, MD or one of the following Advanced Practice Providers on your designated Care Team:    Ronie Spies, PA-C  Jacolyn Reedy, PA-C

## 2020-01-16 ENCOUNTER — Encounter (HOSPITAL_COMMUNITY): Payer: Self-pay | Admitting: Emergency Medicine

## 2020-01-16 ENCOUNTER — Inpatient Hospital Stay (HOSPITAL_COMMUNITY)
Admission: EM | Admit: 2020-01-16 | Discharge: 2020-01-20 | DRG: 177 | Disposition: A | Discharge status: Home / Self Care | Payer: Federal, State, Local not specified - PPO | Attending: Internal Medicine | Admitting: Internal Medicine

## 2020-01-16 ENCOUNTER — Other Ambulatory Visit: Payer: Self-pay

## 2020-01-16 DIAGNOSIS — E119 Type 2 diabetes mellitus without complications: Secondary | ICD-10-CM

## 2020-01-16 DIAGNOSIS — I10 Essential (primary) hypertension: Secondary | ICD-10-CM | POA: Diagnosis present

## 2020-01-16 DIAGNOSIS — D649 Anemia, unspecified: Secondary | ICD-10-CM | POA: Diagnosis present

## 2020-01-16 DIAGNOSIS — R0602 Shortness of breath: Secondary | ICD-10-CM

## 2020-01-16 DIAGNOSIS — Z7902 Long term (current) use of antithrombotics/antiplatelets: Secondary | ICD-10-CM

## 2020-01-16 DIAGNOSIS — J9601 Acute respiratory failure with hypoxia: Secondary | ICD-10-CM | POA: Diagnosis present

## 2020-01-16 DIAGNOSIS — J45909 Unspecified asthma, uncomplicated: Secondary | ICD-10-CM | POA: Diagnosis present

## 2020-01-16 DIAGNOSIS — R0902 Hypoxemia: Secondary | ICD-10-CM | POA: Diagnosis present

## 2020-01-16 DIAGNOSIS — F329 Major depressive disorder, single episode, unspecified: Secondary | ICD-10-CM | POA: Diagnosis present

## 2020-01-16 DIAGNOSIS — Z7984 Long term (current) use of oral hypoglycemic drugs: Secondary | ICD-10-CM

## 2020-01-16 DIAGNOSIS — U071 COVID-19: Secondary | ICD-10-CM | POA: Diagnosis not present

## 2020-01-16 DIAGNOSIS — Z7982 Long term (current) use of aspirin: Secondary | ICD-10-CM

## 2020-01-16 DIAGNOSIS — R059 Cough, unspecified: Secondary | ICD-10-CM

## 2020-01-16 DIAGNOSIS — Z8249 Family history of ischemic heart disease and other diseases of the circulatory system: Secondary | ICD-10-CM

## 2020-01-16 DIAGNOSIS — Z79899 Other long term (current) drug therapy: Secondary | ICD-10-CM

## 2020-01-16 DIAGNOSIS — Z955 Presence of coronary angioplasty implant and graft: Secondary | ICD-10-CM

## 2020-01-16 DIAGNOSIS — Z888 Allergy status to other drugs, medicaments and biological substances status: Secondary | ICD-10-CM

## 2020-01-16 DIAGNOSIS — K219 Gastro-esophageal reflux disease without esophagitis: Secondary | ICD-10-CM | POA: Diagnosis present

## 2020-01-16 DIAGNOSIS — I2510 Atherosclerotic heart disease of native coronary artery without angina pectoris: Secondary | ICD-10-CM | POA: Diagnosis present

## 2020-01-16 DIAGNOSIS — E785 Hyperlipidemia, unspecified: Secondary | ICD-10-CM | POA: Diagnosis present

## 2020-01-16 DIAGNOSIS — J1282 Pneumonia due to coronavirus disease 2019: Secondary | ICD-10-CM | POA: Diagnosis present

## 2020-01-16 NOTE — ED Triage Notes (Signed)
BIB [facility] from home - ems reports pt tested positive for covid on 5/5. Reports pt was seen after being diagnosed. Reports pt refused to wear a mask; therefore, the FD placed pt on NRB. Pt endorses dry cough and shob. A&O  100% on 10L NRB CBG 99

## 2020-01-17 ENCOUNTER — Emergency Department (HOSPITAL_COMMUNITY): Payer: Federal, State, Local not specified - PPO

## 2020-01-17 ENCOUNTER — Encounter (HOSPITAL_COMMUNITY): Payer: Self-pay | Admitting: Internal Medicine

## 2020-01-17 ENCOUNTER — Other Ambulatory Visit (HOSPITAL_COMMUNITY): Payer: Federal, State, Local not specified - PPO

## 2020-01-17 ENCOUNTER — Observation Stay (HOSPITAL_COMMUNITY): Payer: Federal, State, Local not specified - PPO

## 2020-01-17 DIAGNOSIS — J9601 Acute respiratory failure with hypoxia: Secondary | ICD-10-CM | POA: Diagnosis not present

## 2020-01-17 DIAGNOSIS — I2510 Atherosclerotic heart disease of native coronary artery without angina pectoris: Secondary | ICD-10-CM | POA: Diagnosis not present

## 2020-01-17 LAB — COMPREHENSIVE METABOLIC PANEL
ALT: 109 U/L — ABNORMAL HIGH (ref 0–44)
ALT: 93 U/L — ABNORMAL HIGH (ref 0–44)
AST: 68 U/L — ABNORMAL HIGH (ref 15–41)
AST: 84 U/L — ABNORMAL HIGH (ref 15–41)
Albumin: 3.1 g/dL — ABNORMAL LOW (ref 3.5–5.0)
Albumin: 3.6 g/dL (ref 3.5–5.0)
Alkaline Phosphatase: 195 U/L — ABNORMAL HIGH (ref 38–126)
Alkaline Phosphatase: 237 U/L — ABNORMAL HIGH (ref 38–126)
Anion gap: 13 (ref 5–15)
Anion gap: 14 (ref 5–15)
BUN: 15 mg/dL (ref 6–20)
BUN: 18 mg/dL (ref 6–20)
CO2: 24 mmol/L (ref 22–32)
CO2: 26 mmol/L (ref 22–32)
Calcium: 8.7 mg/dL — ABNORMAL LOW (ref 8.9–10.3)
Calcium: 9.3 mg/dL (ref 8.9–10.3)
Chloride: 95 mmol/L — ABNORMAL LOW (ref 98–111)
Chloride: 98 mmol/L (ref 98–111)
Creatinine, Ser: 0.79 mg/dL (ref 0.61–1.24)
Creatinine, Ser: 0.92 mg/dL (ref 0.61–1.24)
GFR calc Af Amer: >60 mL/min (ref >60)
GFR calc Af Amer: >60 mL/min (ref >60)
GFR calc non Af Amer: >60 mL/min (ref >60)
GFR calc non Af Amer: >60 mL/min (ref >60)
Glucose, Bld: 107 mg/dL — ABNORMAL HIGH (ref 70–99)
Glucose, Bld: 90 mg/dL (ref 70–99)
Potassium: 3.3 mmol/L — ABNORMAL LOW (ref 3.5–5.1)
Potassium: 4.2 mmol/L (ref 3.5–5.1)
Sodium: 133 mmol/L — ABNORMAL LOW (ref 135–145)
Sodium: 137 mmol/L (ref 135–145)
Total Bilirubin: 0.8 mg/dL (ref 0.3–1.2)
Total Bilirubin: 0.9 mg/dL (ref 0.3–1.2)
Total Protein: 6.7 g/dL (ref 6.5–8.1)
Total Protein: 7.8 g/dL (ref 6.5–8.1)

## 2020-01-17 LAB — CBC WITH DIFFERENTIAL/PLATELET
Abs Immature Granulocytes: 0.04 10*3/uL (ref 0.00–0.07)
Abs Immature Granulocytes: 0.09 10*3/uL — ABNORMAL HIGH (ref 0.00–0.07)
Basophils Absolute: 0 10*3/uL (ref 0.0–0.1)
Basophils Absolute: 0 10*3/uL (ref 0.0–0.1)
Basophils Relative: 0 %
Basophils Relative: 0 %
Eosinophils Absolute: 0 10*3/uL (ref 0.0–0.5)
Eosinophils Absolute: 0 10*3/uL (ref 0.0–0.5)
Eosinophils Relative: 0 %
Eosinophils Relative: 0 %
HCT: 34 % — ABNORMAL LOW (ref 39.0–52.0)
HCT: 37.6 % — ABNORMAL LOW (ref 39.0–52.0)
Hemoglobin: 11.2 g/dL — ABNORMAL LOW (ref 13.0–17.0)
Hemoglobin: 12.3 g/dL — ABNORMAL LOW (ref 13.0–17.0)
Immature Granulocytes: 1 %
Immature Granulocytes: 1 %
Lymphocytes Relative: 15 %
Lymphocytes Relative: 7 %
Lymphs Abs: 0.5 10*3/uL — ABNORMAL LOW (ref 0.7–4.0)
Lymphs Abs: 0.7 10*3/uL (ref 0.7–4.0)
MCH: 28.4 pg (ref 26.0–34.0)
MCH: 28.6 pg (ref 26.0–34.0)
MCHC: 32.7 g/dL (ref 30.0–36.0)
MCHC: 32.9 g/dL (ref 30.0–36.0)
MCV: 86.8 fL (ref 80.0–100.0)
MCV: 87 fL (ref 80.0–100.0)
Monocytes Absolute: 0.4 10*3/uL (ref 0.1–1.0)
Monocytes Absolute: 0.5 10*3/uL (ref 0.1–1.0)
Monocytes Relative: 7 %
Monocytes Relative: 9 %
Neutro Abs: 3.4 10*3/uL (ref 1.7–7.7)
Neutro Abs: 6 10*3/uL (ref 1.7–7.7)
Neutrophils Relative %: 75 %
Neutrophils Relative %: 85 %
Platelets: 274 10*3/uL (ref 150–400)
Platelets: 293 10*3/uL (ref 150–400)
RBC: 3.91 MIL/uL — ABNORMAL LOW (ref 4.22–5.81)
RBC: 4.33 MIL/uL (ref 4.22–5.81)
RDW: 12.4 % (ref 11.5–15.5)
RDW: 12.4 % (ref 11.5–15.5)
WBC: 4.6 10*3/uL (ref 4.0–10.5)
WBC: 7.2 10*3/uL (ref 4.0–10.5)
nRBC: 0 % (ref 0.0–0.2)
nRBC: 0 % (ref 0.0–0.2)

## 2020-01-17 LAB — BRAIN NATRIURETIC PEPTIDE: B Natriuretic Peptide: 17.8 pg/mL (ref 0.0–100.0)

## 2020-01-17 LAB — SARS CORONAVIRUS 2 BY RT PCR (HOSPITAL ORDER, PERFORMED IN ~~LOC~~ HOSPITAL LAB): SARS Coronavirus 2: POSITIVE — AB

## 2020-01-17 LAB — HEPATITIS PANEL, ACUTE
HCV Ab: NONREACTIVE
Hep A IgM: NONREACTIVE
Hep B C IgM: NONREACTIVE
Hepatitis B Surface Ag: NONREACTIVE

## 2020-01-17 LAB — CBG MONITORING, ED
Glucose-Capillary: 81 mg/dL (ref 70–99)
Glucose-Capillary: 101 mg/dL — ABNORMAL HIGH (ref 70–99)
Glucose-Capillary: 85 mg/dL (ref 70–99)

## 2020-01-17 LAB — TROPONIN I (HIGH SENSITIVITY)
Troponin I (High Sensitivity): 4 ng/L (ref <18)
Troponin I (High Sensitivity): 4 ng/L (ref <18)
Troponin I (High Sensitivity): 4 ng/L (ref <18)

## 2020-01-17 LAB — PROCALCITONIN: Procalcitonin: <0.1 ng/mL

## 2020-01-17 LAB — C-REACTIVE PROTEIN: CRP: 10.7 mg/dL — ABNORMAL HIGH (ref <1.0)

## 2020-01-17 LAB — HIV ANTIBODY (ROUTINE TESTING W REFLEX): HIV Screen 4th Generation wRfx: NONREACTIVE

## 2020-01-17 LAB — ABO/RH: ABO/RH(D): A POS

## 2020-01-17 LAB — D-DIMER, QUANTITATIVE
D-Dimer, Quant: 0.57 ug/mL-FEU — ABNORMAL HIGH (ref 0.00–0.50)
D-Dimer, Quant: 0.61 ug/mL-FEU — ABNORMAL HIGH (ref 0.00–0.50)

## 2020-01-17 LAB — FERRITIN: Ferritin: 675 ng/mL — ABNORMAL HIGH (ref 24–336)

## 2020-01-17 MED ORDER — LORAZEPAM 1 MG PO TABS
1.0000 mg | ORAL_TABLET | Freq: Three times a day (TID) | ORAL | Status: DC | PRN
  Administered 2020-01-17 (×2): 1 mg via ORAL
  Filled 2020-01-17 (×2): qty 1

## 2020-01-17 MED ORDER — CITALOPRAM HYDROBROMIDE 20 MG PO TABS
40.0000 mg | ORAL_TABLET | Freq: Every day | ORAL | Status: DC
  Administered 2020-01-17 – 2020-01-20 (×4): 40 mg via ORAL
  Filled 2020-01-17 (×3): qty 2
  Filled 2020-01-17: qty 4

## 2020-01-17 MED ORDER — ALBUTEROL SULFATE HFA 108 (90 BASE) MCG/ACT IN AERS
2.0000 | INHALATION_SPRAY | Freq: Four times a day (QID) | RESPIRATORY_TRACT | Status: DC | PRN
  Administered 2020-01-17 – 2020-01-20 (×5): 2 via RESPIRATORY_TRACT

## 2020-01-17 MED ORDER — METOPROLOL SUCCINATE ER 50 MG PO TB24
50.0000 mg | ORAL_TABLET | Freq: Every day | ORAL | Status: DC
  Administered 2020-01-18 – 2020-01-20 (×3): 50 mg via ORAL
  Filled 2020-01-17 (×4): qty 1

## 2020-01-17 MED ORDER — ENOXAPARIN SODIUM 40 MG/0.4ML ~~LOC~~ SOLN
40.0000 mg | SUBCUTANEOUS | Status: DC
  Administered 2020-01-17 – 2020-01-20 (×4): 40 mg via SUBCUTANEOUS
  Filled 2020-01-17 (×4): qty 0.4

## 2020-01-17 MED ORDER — NITROGLYCERIN 0.4 MG SL SUBL: 0.4000 mg | SUBLINGUAL_TABLET | SUBLINGUAL | Status: DC | PRN

## 2020-01-17 MED ORDER — PANTOPRAZOLE SODIUM 40 MG PO TBEC
40.0000 mg | DELAYED_RELEASE_TABLET | Freq: Every day | ORAL | Status: DC
  Administered 2020-01-18 – 2020-01-20 (×3): 40 mg via ORAL
  Filled 2020-01-17 (×4): qty 1

## 2020-01-17 MED ORDER — AEROCHAMBER Z-STAT PLUS/MEDIUM MISC
1.0000 | Freq: Once | Status: AC
  Administered 2020-01-17: 1
  Filled 2020-01-17: qty 1

## 2020-01-17 MED ORDER — IOHEXOL 350 MG/ML SOLN
100.0000 mL | Freq: Once | INTRAVENOUS | Status: AC | PRN
  Administered 2020-01-17: 100 mL via INTRAVENOUS

## 2020-01-17 MED ORDER — CLOPIDOGREL BISULFATE 75 MG PO TABS
75.0000 mg | ORAL_TABLET | Freq: Every day | ORAL | Status: DC
  Administered 2020-01-18 – 2020-01-20 (×3): 75 mg via ORAL
  Filled 2020-01-17 (×4): qty 1

## 2020-01-17 MED ORDER — FLUTICASONE PROPIONATE 50 MCG/ACT NA SUSP: 1.0000 | Freq: Every day | NASAL | Status: DC | PRN

## 2020-01-17 MED ORDER — ALBUTEROL SULFATE HFA 108 (90 BASE) MCG/ACT IN AERS
8.0000 | INHALATION_SPRAY | Freq: Once | RESPIRATORY_TRACT | Status: AC
  Administered 2020-01-17: 8 via RESPIRATORY_TRACT
  Filled 2020-01-17: qty 6.7

## 2020-01-17 MED ORDER — GUAIFENESIN-DM 100-10 MG/5ML PO SYRP
10.0000 mL | ORAL_SOLUTION | ORAL | Status: DC | PRN
  Filled 2020-01-17 (×2): qty 10

## 2020-01-17 MED ORDER — FAMOTIDINE 20 MG PO TABS
40.0000 mg | ORAL_TABLET | Freq: Every day | ORAL | Status: DC
  Administered 2020-01-17 – 2020-01-20 (×4): 40 mg via ORAL
  Filled 2020-01-17 (×5): qty 2

## 2020-01-17 MED ORDER — EZETIMIBE 10 MG PO TABS
10.0000 mg | ORAL_TABLET | Freq: Every day | ORAL | Status: DC
  Administered 2020-01-18 – 2020-01-20 (×3): 10 mg via ORAL
  Filled 2020-01-17 (×4): qty 1

## 2020-01-17 MED ORDER — GUAIFENESIN 100 MG/5ML PO SOLN
5.0000 mL | Freq: Once | ORAL | Status: AC
  Administered 2020-01-17: 100 mg via ORAL
  Filled 2020-01-17: qty 10

## 2020-01-17 MED ORDER — AEROCHAMBER PLUS FLO-VU MISC: 1.0000 | Freq: Once | Status: DC

## 2020-01-17 MED ORDER — ASPIRIN EC 81 MG PO TBEC
81.0000 mg | DELAYED_RELEASE_TABLET | Freq: Every day | ORAL | Status: DC
  Administered 2020-01-17 – 2020-01-20 (×4): 81 mg via ORAL
  Filled 2020-01-17 (×4): qty 1

## 2020-01-17 MED ORDER — ROSUVASTATIN CALCIUM 20 MG PO TABS
40.0000 mg | ORAL_TABLET | Freq: Every day | ORAL | Status: DC
  Administered 2020-01-18 – 2020-01-20 (×3): 40 mg via ORAL
  Filled 2020-01-17 (×4): qty 2

## 2020-01-17 MED ORDER — INSULIN ASPART 100 UNIT/ML ~~LOC~~ SOLN
0.0000 [IU] | Freq: Three times a day (TID) | SUBCUTANEOUS | Status: DC
  Administered 2020-01-18: 2 [IU] via SUBCUTANEOUS
  Administered 2020-01-19: 3 [IU] via SUBCUTANEOUS
  Administered 2020-01-19: 2 [IU] via SUBCUTANEOUS
  Administered 2020-01-19: 5 [IU] via SUBCUTANEOUS
  Filled 2020-01-17: qty 0.09

## 2020-01-17 MED ORDER — HYOSCYAMINE SULFATE ER 0.375 MG PO TB12
0.3750 mg | ORAL_TABLET | Freq: Two times a day (BID) | ORAL | Status: DC
  Administered 2020-01-17 – 2020-01-20 (×7): 0.375 mg via ORAL
  Filled 2020-01-17 (×8): qty 1

## 2020-01-17 MED ORDER — FERROUS SULFATE 325 (65 FE) MG PO TABS
325.0000 mg | ORAL_TABLET | Freq: Two times a day (BID) | ORAL | Status: DC
  Administered 2020-01-17 – 2020-01-20 (×7): 325 mg via ORAL
  Filled 2020-01-17 (×7): qty 1

## 2020-01-17 MED ORDER — HYDROCODONE-ACETAMINOPHEN 5-325 MG PO TABS
1.0000 | ORAL_TABLET | Freq: Once | ORAL | Status: AC
  Administered 2020-01-17: 1 via ORAL
  Filled 2020-01-17: qty 1

## 2020-01-17 MED ORDER — HYDROCOD POLST-CPM POLST ER 10-8 MG/5ML PO SUER
5.0000 mL | Freq: Two times a day (BID) | ORAL | Status: DC | PRN
  Administered 2020-01-17 – 2020-01-20 (×6): 5 mL via ORAL
  Filled 2020-01-17 (×6): qty 5

## 2020-01-17 MED ORDER — MONTELUKAST SODIUM 10 MG PO TABS
10.0000 mg | ORAL_TABLET | Freq: Every day | ORAL | Status: DC
  Administered 2020-01-18 – 2020-01-20 (×3): 10 mg via ORAL
  Filled 2020-01-17 (×4): qty 1

## 2020-01-17 MED ORDER — BUPROPION HCL ER (XL) 150 MG PO TB24
150.0000 mg | ORAL_TABLET | Freq: Every morning | ORAL | Status: DC
  Administered 2020-01-17 – 2020-01-20 (×4): 150 mg via ORAL
  Filled 2020-01-17 (×4): qty 1

## 2020-01-17 MED ORDER — ONDANSETRON HCL 4 MG/2ML IJ SOLN: 4.0000 mg | Freq: Four times a day (QID) | INTRAMUSCULAR | Status: DC | PRN

## 2020-01-17 MED ORDER — SODIUM CHLORIDE (PF) 0.9 % IJ SOLN
INTRAMUSCULAR | Status: AC
  Filled 2020-01-17: qty 50

## 2020-01-17 MED ORDER — ONDANSETRON HCL 4 MG PO TABS: 4.0000 mg | ORAL_TABLET | Freq: Four times a day (QID) | ORAL | Status: DC | PRN

## 2020-01-17 MED ORDER — MIRTAZAPINE 15 MG PO TABS
45.0000 mg | ORAL_TABLET | Freq: Every day | ORAL | Status: DC
  Administered 2020-01-17 – 2020-01-19 (×3): 45 mg via ORAL
  Filled 2020-01-17: qty 1
  Filled 2020-01-17 (×3): qty 3

## 2020-01-17 NOTE — Progress Notes (Signed)
NO charge FU note.  Patient admitted with hypoxic resp failure after COVID dx 10 days ago. D-dimer and markers elevated. Repeat COVID test pending. CTA negative for PE, shows typical bilateral ground glass opacities c/w viral infection.

## 2020-01-17 NOTE — ED Notes (Signed)
Patient placed on 2l O2 [place] for comfort measure

## 2020-01-17 NOTE — ED Notes (Signed)
Pt lying in bed. States he is starting to feel a little better. NAD noted. Pt denies any needs.

## 2020-01-17 NOTE — ED Notes (Signed)
Date and time results received: 01/17/20 12:27 PM   Test: Covid Critical Value: positive  Name of Provider Notified: result consistent with previous result from 5/5

## 2020-01-17 NOTE — ED Notes (Signed)
Patient placed in hospital bed for comfort due to long wait for hospital bed upstairs. Patient reports increased comfort at bed change

## 2020-01-17 NOTE — ED Provider Notes (Signed)
[facility]-EMERGENCY DEPT Provider Note   CSN: [id number] Arrival date & time: 01/16/20  2324     History Chief Complaint  Patient presents with  . COVID +  . Shortness of Breath    Alexander Calderon is a 59 y.o. male.  The history is provided by the patient.  Shortness of Breath Severity:  Moderate Onset quality:  Sudden Timing:  Constant Progression:  Unchanged Chronicity:  New Relieved by:  Nothing Worsened by:  Nothing Associated symptoms: cough and diaphoresis   Associated symptoms: no chest pain, no fever, no hemoptysis and no vomiting   Patient history of CAD, asthma presents with shortness of breath.  Patient was diagnosed with COVID-19 on May 5.  He reports his symptoms never really resolved, and tonight he became abruptly short of breath and diaphoretic.  No chest pain.  He reports associated nausea.  He reports continued cough without hemoptysis     Past Medical History:  Diagnosis Date  . Asthma   . Coronary artery disease    a. history of BMS to RCA in 2000 and again in 2002. b. PTCA of the distal LCx in 2011. c. [place] 11/2013: s/p DES to distal RCA, normal LV function (done from L radial approach as pt has history of  difficulty engaging the LCA and significant radial spasm from R radial approach).  . Depression   . GERD (gastroesophageal reflux disease)   . Hyperlipidemia   . Hypertension   . Type 2 diabetes mellitus [facility])     Patient Active Problem List   Diagnosis Date Noted  . Unstable angina pectoris (HCC) 11/16/2013  . Type 2 diabetes mellitus (HCC)   . Depression   . GERD (gastroesophageal reflux disease)   . Hyperlipidemia   . Hypertension   . CAD (coronary artery disease), native coronary artery   . Asthma     Past Surgical History:  Procedure Laterality Date  . APPENDECTOMY    . LEFT HEART CATHETERIZATION WITH CORONARY ANGIOGRAM N/A 11/17/2013   Procedure: LEFT HEART CATHETERIZATION WITH CORONARY ANGIOGRAM;  Surgeon:  Peter M Swaziland, MD;  Location: [facility] CATH LAB;  Service: Cardiovascular;  Laterality: N/A;       Family History  Problem Relation Age of Onset  . Heart attack Father        age 79  . Coronary artery disease Brother        onset  CAD age 8    Social History   Tobacco Use  . Smoking status: Never Smoker  . Smokeless tobacco: Never Used  Substance Use Topics  . Alcohol use: Yes    Alcohol/week: 1.0 standard drinks    Types: 1 drink(s) per week  . Drug use: No    Home Medications Prior to Admission medications   Medication Sig Start Date End Date Taking? Authorizing Provider  albuterol (PROVENTIL HFA;VENTOLIN HFA) 108 (90 BASE) MCG/ACT inhaler Inhale 2 puffs into the lungs every 6 (six) hours as needed for wheezing.    [provider]  Alum Hydroxide-Mag Trisilicate (GAVISCON) 80-14.2 MG CHEW Chew 1 tablet by mouth as directed. 03/28/19 03/27/20  [provider]  aspirin 81 MG tablet Take 81 mg by mouth daily.      [provider]  Azelastine HCl 0.15 % SOLN Place 1 spray into the nose as needed. 06/25/19   [provider]  buPROPion (WELLBUTRIN XL) 150 MG 24 hr tablet Take 150 mg by mouth every morning. Take 3 tablets every morning  06/25/19   [provider]  citalopram (CELEXA) 40 MG tablet Take 40 mg by mouth daily.    [provider]  clopidogrel (PLAVIX) 75 MG tablet TAKE ONE (1) TABLET BY MOUTH EVERY DAY 09/10/19   Kathleene Hazel, MD  EPINEPHrine (EPIPEN) 0.3 mg/0.3 mL SOAJ injection Inject 0.3 mg into the muscle as directed.    [provider]  ezetimibe (ZETIA) 10 MG tablet Take 10 mg by mouth daily.      [provider]  famotidine (PEPCID) 20 MG tablet Take 2 tablets by mouth as directed. 07/28/19   [provider]  ferrous sulfate 325 (65 FE) MG tablet Take 325 mg by mouth 2 (two) times daily.    [provider]  fluticasone (FLONASE) 50 MCG/ACT nasal spray Place 1 spray into  both nostrils as needed. 06/25/19   [provider]  hyoscyamine (LEVBID) 0.375 MG 12 hr tablet Take 0.375 mg by mouth 2 (two) times daily. 01/09/19   [provider]  LORazepam (ATIVAN) 1 MG tablet Take 1 mg by mouth every 8 (eight) hours as needed for anxiety.    [provider]  metFORMIN (GLUCOPHAGE-XR) 500 MG 24 hr tablet Take 2 tablets by mouth daily.  01/11/16   [provider]  metoprolol (TOPROL-XL) 50 MG 24 hr tablet Take 1 tablet by mouth daily.  02/21/11   [provider]  mirtazapine (REMERON) 45 MG tablet Take 1 tab at night 11/08/13   [provider]  nitroGLYCERIN (NITROSTAT) 0.4 MG SL tablet DISSOLVE 1 TABLET UNDER TONGUE EVERY 5 MINUTES FOR UP TO 3 DOSES AS NEEDED FOR CHEST PAIN 03/10/19   Kathleene Hazel, MD  omeprazole (PRILOSEC) 40 MG capsule Take 40 mg by mouth daily. 01/11/16   [provider]  rosuvastatin (CRESTOR) 40 MG tablet Take 40 mg by mouth daily.      [provider]  sildenafil (REVATIO) 20 MG tablet Take 40-100 mg by mouth as needed. 05/16/17   [provider]  SINGULAIR 10 MG tablet Take 10 mg by mouth daily.  02/14/11   [provider]  sitaGLIPtin (JANUVIA) 100 MG tablet Take 100 mg by mouth daily. 06/25/19   [provider]    Allergies    Dextrans  Review of Systems   Review of Systems  Constitutional: Positive for diaphoresis. Negative for fever.  Respiratory: Positive for cough and shortness of breath. Negative for hemoptysis.   Cardiovascular: Negative for chest pain and leg swelling.  Gastrointestinal: Negative for vomiting.  All other systems reviewed and are negative.   Physical Exam Updated Vital Signs BP (!) 144/82 (BP Location: Right Arm)   Pulse (!) 109   Temp 98.4 F (36.9 C) (Oral)   Resp (!) 21   Ht 1.651 m (5\' 5" )   Wt 71.2 kg   SpO2 100%   BMI 26.13 kg/m   Physical Exam CONSTITUTIONAL: Well developed/well nourished HEAD:  Normocephalic/atraumatic EYES: EOMI/PERRL ENMT: Mucous membranes moist NECK: supple no meningeal signs SPINE/BACK:entire spine nontender CV: S1/S2 noted, no murmurs/rubs/gallops noted LUNGS: Lungs are clear to auscultation bilaterally, coughs frequently on exam ABDOMEN: soft, nontender, no rebound or guarding, bowel sounds noted throughout abdomen GU:no cva tenderness NEURO: Pt is awake/alert/appropriate, moves all extremitiesx4.  No facial droop.  EXTREMITIES: pulses normal/equal, full ROM, no lower extremity edema SKIN: warm, color normal PSYCH: no abnormalities of mood noted, alert and oriented to situation  ED Results / Procedures / Treatments   Labs (all  labs ordered are listed, but only abnormal results are displayed) Labs Reviewed  CBC WITH DIFFERENTIAL/PLATELET - Abnormal; Notable for the following components:      Result Value   Hemoglobin 12.3 (*)    HCT 37.6 (*)    Lymphs Abs 0.5 (*)    Abs Immature Granulocytes 0.09 (*)    All other components within normal limits  COMPREHENSIVE METABOLIC PANEL - Abnormal; Notable for the following components:   Glucose, Bld 107 (*)    AST 84 (*)    ALT 109 (*)    Alkaline Phosphatase 237 (*)    All other components within normal limits  D-DIMER, QUANTITATIVE (NOT AT [facility]) - Abnormal; Notable for the following components:   D-Dimer, Quant 0.57 (*)    All other components within normal limits  SARS CORONAVIRUS 2 BY RT PCR (HOSPITAL ORDER, [place] [facility])  TROPONIN I (HIGH SENSITIVITY)  TROPONIN I (HIGH SENSITIVITY)    EKG EKG Interpretation  Date/Time:  Saturday Jan 17 2020 00:28:58 EDT Ventricular Rate:  88 PR Interval:    QRS Duration: 97 QT Interval:  380 QTC Calculation: 460 R Axis:   74 Text Interpretation: Sinus rhythm Baseline wander in lead(s) V1 No significant change since last tracing Confirmed by Ripley Fraise [phone]) on 01/17/2020 12:34:08 AM   Radiology DG Chest Port 1  View  Result Date: 01/17/2020 CLINICAL DATA:  Shortness of breath EXAM: PORTABLE CHEST 1 VIEW COMPARISON:  11/16/2013 FINDINGS: The heart size and mediastinal contours are within normal limits. Both lungs are clear. The visualized skeletal structures are unremarkable. IMPRESSION: No active disease. Electronically Signed   By: Donavan Foil M.D.   On: 01/17/2020 00:16    Procedures Procedures   Medications Ordered in ED Medications  guaiFENesin (ROBITUSSIN) 100 MG/5ML solution 100 mg (100 mg Oral Given 01/17/20 0133)  albuterol (VENTOLIN HFA) 108 (90 Base) MCG/ACT inhaler 8 puff (8 puffs Inhalation Given 01/17/20 0131)  aerochamber Z-Stat Plus/medium 1 each (1 each Other Given 01/17/20 0133)  HYDROcodone-acetaminophen (NORCO/VICODIN) 5-325 MG per tablet 1 tablet (1 tablet Oral Given 01/17/20 0331)    ED Course  I have reviewed the triage vital signs and the nursing notes.  Pertinent labs & imaging results that were available during my care of the patient were reviewed by me and considered in my medical decision making (see chart for details).    MDM Rules/Calculators/A&P                      2:08 AM Patient diagnosed with COVID-19 on May 5.  Tonight became abruptly worse with cough and shortness of breath and diaphoresis.  Patient calls frequently exam but is overall in no distress.  No hypoxia.  Plan to treat cough and reassess. Denies any chest pain, initial EKG unremarkable, initial troponin negative 3:27 AM Patient reports continued shortness of breath and cough.  Patient is tachycardic and mildly tachypneic.  Pulse ox on room air is in the mid 90s. We will need to explore causes of shortness of breath further, will obtain a D-dimer to rule out PE 4:17 AM D-dimer matches his age, therefore acute PE is less likely. Patient still reporting shortness of breath.  Any movement in the bed triggers significant dyspnea.  Patient is unable to ambulate. When he takes deep breaths on room air,  pulse ox is in the mid 90s  Due to severe symptomatic disease, patient will be admitted to the hospital. At this point  I feel that ACS/PE has been ruled out. No signs of acute CHF.   Discussed with Dr. Toniann Fail for admission    This patient presents to the ED for concern of cough and shortness of breath, this involves an extensive number of treatment options, and is a complaint that carries with it a high risk of complications and morbidity.  The differential diagnosis includes pneumonia, CHF, PE, ACS   Lab Tests:   I Ordered, reviewed, and interpreted labs, which included electrolytes, blood count, troponin  Medicines ordered:   I ordered medication albuterol and guaifenesin for cough  Imaging Studies ordered:   I ordered imaging studies which included chest x-ray   I independently visualized and interpreted imaging which showed no acute findings  Additional history obtained:    Previous records obtained and reviewed   Consultations Obtained:   I consulted [facility] hospitalist Dr. Toniann Fail and discussed lab and imaging findings  Reevaluation:  After the interventions stated above, I reevaluated the patient and found patient not improved   Marina Desire was evaluated in Emergency Department on 01/17/2020 for the symptoms described in the history of present illness. He was evaluated in the context of the global COVID-19 pandemic, which necessitated consideration that the patient might be at risk for infection with the SARS-CoV-2 virus that causes COVID-19. Institutional protocols and algorithms that pertain to the evaluation of patients at risk for COVID-19 are in a state of rapid change based on information released by regulatory bodies including the CDC and federal and state organizations. These policies and algorithms were followed during the patient's care in the ED.  Final Clinical Impression(s) / ED Diagnoses Final diagnoses:  COVID-19  Cough  Shortness of  breath    Rx / DC Orders ED Discharge Orders    None       Zadie Rhine, MD 01/17/20 [phone]

## 2020-01-17 NOTE — ED Notes (Signed)
Pt lying in bed. Pt became tachypnea and SOB upon sitting up for about 5-10 mins. Warm blanket and water given. Will continue to monitor.

## 2020-01-17 NOTE — H&P (Signed)
History and Physical    Alexander Calderon TDD:[id number] DOB: [birth year] DOA: 01/16/2020  PCP: Loyal Jacobson, MD  Patient coming from: Home.  Chief Complaint: Shortness of breath body aches.  HPI: Alexander Calderon is a 59 y.o. male with history of CAD status post stenting diabetes mellitus type 2, asthma, hypertension depression presents to the ER because of worsening shortness of breath and fatigue.  Patient states he was diagnosed with COVID-19 infection on Jan 07, 2020 10 days ago.  Patient does not have the results of the test with him.  Since then patient has been having persistent cough body aches fatigue and worsening shortness of breath.  Patient gets easily short of breath on exertion.  Has been having nonproductive cough.  Has been taking some Tylenol for subjective feeling of fever chills.  Due to worsening symptoms patient called EMS and patient was brought to the ER.  Denies any nausea vomiting or diarrhea.  ED Course: In the ER patient was afebrile and not hypoxic.  But appears tachypneic and restless.  EKG shows normal sinus rhythm high sensitive troponins were negative.  Chest x-ray unremarkable.  Since patient does not have the documented test for COVID-19 infection patient has had repeat Covid test ordered which is pending.  Hemoglobin is around 12.3 alkaline phos of 237 AST 84 ALT 109.  Review of Systems: As per HPI, rest all negative.   Past Medical History:  Diagnosis Date  . Asthma   . Coronary artery disease    a. history of BMS to RCA in 2000 and again in 2002. b. PTCA of the distal LCx in 2011. c. [place] 11/2013: s/p DES to distal RCA, normal LV function (done from L radial approach as pt has history of  difficulty engaging the LCA and significant radial spasm from R radial approach).  . Depression   . GERD (gastroesophageal reflux disease)   . Hyperlipidemia   . Hypertension   . Type 2 diabetes mellitus (HCC)     Past Surgical History:  Procedure Laterality Date  .  APPENDECTOMY    . LEFT HEART CATHETERIZATION WITH CORONARY ANGIOGRAM N/A 11/17/2013   Procedure: LEFT HEART CATHETERIZATION WITH CORONARY ANGIOGRAM;  Surgeon: Peter M Swaziland, MD;  Location: [facility] CATH LAB;  Service: Cardiovascular;  Laterality: N/A;     reports that he has never smoked. He has never used smokeless tobacco. He reports current alcohol use of about 1.0 standard drinks of alcohol per week. He reports that he does not use drugs.  Allergies  Allergen Reactions  . Dextrans Nausea Only    Family History  Problem Relation Age of Onset  . Heart attack Father        age 72  . Coronary artery disease Brother        onset  CAD age 22    Prior to Admission medications   Medication Sig Start Date End Date Taking? Authorizing Provider  albuterol (PROVENTIL HFA;VENTOLIN HFA) 108 (90 BASE) MCG/ACT inhaler Inhale 2 puffs into the lungs every 6 (six) hours as needed for wheezing.    [provider]  Alum Hydroxide-Mag Trisilicate (GAVISCON) 80-14.2 MG CHEW Chew 1 tablet by mouth as directed. 03/28/19 03/27/20  [provider]  aspirin 81 MG tablet Take 81 mg by mouth daily.      [provider]  Azelastine HCl 0.15 % SOLN Place 1 spray into the nose as needed. 06/25/19   [provider]  buPROPion (WELLBUTRIN XL) 150 MG 24 hr tablet  Take 150 mg by mouth every morning. Take 3 tablets every morning 06/25/19   [provider]  citalopram (CELEXA) 40 MG tablet Take 40 mg by mouth daily.    [provider]  clopidogrel (PLAVIX) 75 MG tablet TAKE ONE (1) TABLET BY MOUTH EVERY DAY 09/10/19   Burnell Blanks, MD  EPINEPHrine (EPIPEN) 0.3 mg/0.3 mL SOAJ injection Inject 0.3 mg into the muscle as directed.    [provider]  ezetimibe (ZETIA) 10 MG tablet Take 10 mg by mouth daily.      [provider]  famotidine (PEPCID) 20 MG tablet Take 2 tablets by mouth as directed. 07/28/19   [provider]  ferrous sulfate  325 (65 FE) MG tablet Take 325 mg by mouth 2 (two) times daily.    [provider]  fluticasone (FLONASE) 50 MCG/ACT nasal spray Place 1 spray into both nostrils as needed. 06/25/19   [provider]  hyoscyamine (LEVBID) 0.375 MG 12 hr tablet Take 0.375 mg by mouth 2 (two) times daily. 01/09/19   [provider]  LORazepam (ATIVAN) 1 MG tablet Take 1 mg by mouth every 8 (eight) hours as needed for anxiety.    [provider]  metFORMIN (GLUCOPHAGE-XR) 500 MG 24 hr tablet Take 2 tablets by mouth daily.  01/11/16   [provider]  metoprolol (TOPROL-XL) 50 MG 24 hr tablet Take 1 tablet by mouth daily.  02/21/11   [provider]  mirtazapine (REMERON) 45 MG tablet Take 1 tab at night 11/08/13   [provider]  nitroGLYCERIN (NITROSTAT) 0.4 MG SL tablet DISSOLVE 1 TABLET UNDER TONGUE EVERY 5 MINUTES FOR UP TO 3 DOSES AS NEEDED FOR CHEST PAIN 03/10/19   Burnell Blanks, MD  omeprazole (PRILOSEC) 40 MG capsule Take 40 mg by mouth daily. 01/11/16   [provider]  rosuvastatin (CRESTOR) 40 MG tablet Take 40 mg by mouth daily.      [provider]  sildenafil (REVATIO) 20 MG tablet Take 40-100 mg by mouth as needed. 05/16/17   [provider]  SINGULAIR 10 MG tablet Take 10 mg by mouth daily.  02/14/11   [provider]  sitaGLIPtin (JANUVIA) 100 MG tablet Take 100 mg by mouth daily. 06/25/19   [provider]    Physical Exam: Constitutional: Moderately built and nourished. Vitals:   01/17/20 0430 01/17/20 0500 01/17/20 0530 01/17/20 0600  BP: 119/73 117/73 115/69 119/75  Pulse: 89 87 85 82  Resp: 15 19 (!) 26 19  Temp:      TempSrc:      SpO2: 95% 94% 95% 99%  Weight:      Height:       Eyes: Anicteric no pallor. ENMT: No discharge from the ears eyes nose or mouth. Neck: No mass felt.  No neck rigidity. Respiratory: No rhonchi or crepitations. Cardiovascular: S1-S2 heard. Abdomen:  Soft nontender bowel sounds present. Musculoskeletal: No edema. Skin: No rash. Neurologic: Alert awake oriented to time place and person.  Moves all extremities. Psychiatric: Appears normal but normal affect.   Labs on Admission: I have personally reviewed following labs and imaging studies  CBC: Recent Labs  Lab 01/16/20 2353  WBC 7.2  NEUTROABS 6.0  HGB 12.3*  HCT 37.6*  MCV 86.8  PLT 035   Basic Metabolic Panel: Recent Labs  Lab 01/16/20 2353  NA 137  K 4.2  CL 98  CO2 26  GLUCOSE 107*  BUN 18  CREATININE  0.92  CALCIUM 9.3   GFR: Estimated Creatinine Clearance: 77.1 mL/min (by C-G formula based on SCr of 0.92 mg/dL). Liver Function Tests: Recent Labs  Lab 01/16/20 2353  AST 84*  ALT 109*  ALKPHOS 237*  BILITOT 0.9  PROT 7.8  ALBUMIN 3.6   No results for input(s): LIPASE, AMYLASE in the last 168 hours. No results for input(s): AMMONIA in the last 168 hours. Coagulation Profile: No results for input(s): INR, PROTIME in the last 168 hours. Cardiac Enzymes: No results for input(s): CKTOTAL, CKMB, CKMBINDEX, TROPONINI in the last 168 hours. BNP (last 3 results) No results for input(s): PROBNP in the last 8760 hours. HbA1C: No results for input(s): HGBA1C in the last 72 hours. CBG: No results for input(s): GLUCAP in the last 168 hours. Lipid Profile: No results for input(s): CHOL, HDL, LDLCALC, TRIG, CHOLHDL, LDLDIRECT in the last 72 hours. Thyroid Function Tests: No results for input(s): TSH, T4TOTAL, FREET4, T3FREE, THYROIDAB in the last 72 hours. Anemia Panel: No results for input(s): VITAMINB12, FOLATE, FERRITIN, TIBC, IRON, RETICCTPCT in the last 72 hours. Urine analysis: No results found for: COLORURINE, APPEARANCEUR, LABSPEC, PHURINE, GLUCOSEU, HGBUR, BILIRUBINUR, KETONESUR, PROTEINUR, UROBILINOGEN, NITRITE, LEUKOCYTESUR Sepsis Labs: @LABRCNTIP (procalcitonin:4,lacticidven:4) )No results found for this or any previous visit (from the past 240  hour(s)).   Radiological Exams on Admission: DG Chest Port 1 View  Result Date: 01/17/2020 CLINICAL DATA:  Shortness of breath EXAM: PORTABLE CHEST 1 VIEW COMPARISON:  11/16/2013 FINDINGS: The heart size and mediastinal contours are within normal limits. Both lungs are clear. The visualized skeletal structures are unremarkable. IMPRESSION: No active disease. Electronically Signed   By: 11/18/2013 M.D.   On: 01/17/2020 00:16    EKG: Independently reviewed.  Normal sinus rhythm.  Assessment/Plan Principal Problem:   Acute respiratory failure with hypoxia (HCC) Active Problems:   Hyperlipidemia   CAD (coronary artery disease), native coronary artery   Asthma   Type 2 diabetes mellitus (HCC)    1. Acute respiratory failure hypoxia/exertional dyspnea with recent COVID-19 infection could be from the Covid infection.  However since patient has CAD will check BNP cycle cardiac markers check CT angiogram of chest.  Also will check inflammatory markers including CRP ferritin levels.   2. CAD denies any chest pain but does have shortness of breath and tachypnea.  I am checking BNP and thickening cardiac markers.  Continue antiplatelet agents beta-blockers and statins. 3. Diabetes mellitus type 2 we will keep patient on sliding scale coverage. 4. Mildly elevated LFTs.  Abdomen appears benign patient denies any nausea vomiting and diarrhea could be from Covid infection.  Will need follow-up LFTs and also check acute hepatitis panel.  If LFTs worsen may have to hold patient's statins. 5. Asthma presently not wheezing continue inhalers. 6. Anemia appears to be new.  Follow CBC.  If any worsening may need further work-up.  Covid test is pending.   DVT prophylaxis: Lovenox. Code Status: Full code. Family Communication: Discussed with patient. Disposition Plan: Home. Consults called: None. Admission status: Observation.   01/19/2020 MD [facility] Pager [phone].  If  7PM-7AM, please contact night-coverage [email or web address] Password [facility]  01/17/2020, 7:07 AM

## 2020-01-18 DIAGNOSIS — Z888 Allergy status to other drugs, medicaments and biological substances status: Secondary | ICD-10-CM | POA: Diagnosis not present

## 2020-01-18 DIAGNOSIS — D649 Anemia, unspecified: Secondary | ICD-10-CM | POA: Diagnosis present

## 2020-01-18 DIAGNOSIS — Z79899 Other long term (current) drug therapy: Secondary | ICD-10-CM | POA: Diagnosis not present

## 2020-01-18 DIAGNOSIS — J45909 Unspecified asthma, uncomplicated: Secondary | ICD-10-CM | POA: Diagnosis present

## 2020-01-18 DIAGNOSIS — R0902 Hypoxemia: Secondary | ICD-10-CM | POA: Diagnosis present

## 2020-01-18 DIAGNOSIS — Z7984 Long term (current) use of oral hypoglycemic drugs: Secondary | ICD-10-CM | POA: Diagnosis not present

## 2020-01-18 DIAGNOSIS — R0602 Shortness of breath: Secondary | ICD-10-CM | POA: Diagnosis present

## 2020-01-18 DIAGNOSIS — Z8249 Family history of ischemic heart disease and other diseases of the circulatory system: Secondary | ICD-10-CM | POA: Diagnosis not present

## 2020-01-18 DIAGNOSIS — J1282 Pneumonia due to coronavirus disease 2019: Secondary | ICD-10-CM | POA: Diagnosis present

## 2020-01-18 DIAGNOSIS — F329 Major depressive disorder, single episode, unspecified: Secondary | ICD-10-CM | POA: Diagnosis present

## 2020-01-18 DIAGNOSIS — K219 Gastro-esophageal reflux disease without esophagitis: Secondary | ICD-10-CM | POA: Diagnosis present

## 2020-01-18 DIAGNOSIS — I2510 Atherosclerotic heart disease of native coronary artery without angina pectoris: Secondary | ICD-10-CM | POA: Diagnosis present

## 2020-01-18 DIAGNOSIS — Z955 Presence of coronary angioplasty implant and graft: Secondary | ICD-10-CM | POA: Diagnosis not present

## 2020-01-18 DIAGNOSIS — E119 Type 2 diabetes mellitus without complications: Secondary | ICD-10-CM | POA: Diagnosis present

## 2020-01-18 DIAGNOSIS — I10 Essential (primary) hypertension: Secondary | ICD-10-CM | POA: Diagnosis present

## 2020-01-18 DIAGNOSIS — E785 Hyperlipidemia, unspecified: Secondary | ICD-10-CM | POA: Diagnosis present

## 2020-01-18 DIAGNOSIS — U071 COVID-19: Secondary | ICD-10-CM | POA: Diagnosis present

## 2020-01-18 DIAGNOSIS — J9601 Acute respiratory failure with hypoxia: Secondary | ICD-10-CM | POA: Diagnosis present

## 2020-01-18 DIAGNOSIS — Z7902 Long term (current) use of antithrombotics/antiplatelets: Secondary | ICD-10-CM | POA: Diagnosis not present

## 2020-01-18 DIAGNOSIS — Z7982 Long term (current) use of aspirin: Secondary | ICD-10-CM | POA: Diagnosis not present

## 2020-01-18 LAB — BASIC METABOLIC PANEL
Anion gap: 13 (ref 5–15)
BUN: 19 mg/dL (ref 6–20)
CO2: 29 mmol/L (ref 22–32)
Calcium: 9.1 mg/dL (ref 8.9–10.3)
Chloride: 98 mmol/L (ref 98–111)
Creatinine, Ser: 0.83 mg/dL (ref 0.61–1.24)
GFR calc Af Amer: >60 mL/min (ref >60)
GFR calc non Af Amer: >60 mL/min (ref >60)
Glucose, Bld: 111 mg/dL — ABNORMAL HIGH (ref 70–99)
Potassium: 4.2 mmol/L (ref 3.5–5.1)
Sodium: 140 mmol/L (ref 135–145)

## 2020-01-18 LAB — GLUCOSE, CAPILLARY
Glucose-Capillary: 79 mg/dL (ref 70–99)
Glucose-Capillary: 105 mg/dL — ABNORMAL HIGH (ref 70–99)
Glucose-Capillary: 104 mg/dL — ABNORMAL HIGH (ref 70–99)
Glucose-Capillary: 109 mg/dL — ABNORMAL HIGH (ref 70–99)
Glucose-Capillary: 163 mg/dL — ABNORMAL HIGH (ref 70–99)
Glucose-Capillary: 283 mg/dL — ABNORMAL HIGH (ref 70–99)

## 2020-01-18 MED ORDER — DEXAMETHASONE SODIUM PHOSPHATE 10 MG/ML IJ SOLN
6.0000 mg | Freq: Two times a day (BID) | INTRAMUSCULAR | Status: DC
  Administered 2020-01-18 – 2020-01-20 (×5): 6 mg via INTRAVENOUS
  Filled 2020-01-18 (×5): qty 1

## 2020-01-18 NOTE — Progress Notes (Signed)
Patient complains of chest tightness this morning. Denies any chest pain,  nausea and vomiting . Refuses nitro and malox. Refuses robitussin because  it contains dextromethorphan.  Tussinex given, tolerated well. Incentive spirometry given,verbalize understanding.

## 2020-01-18 NOTE — Progress Notes (Signed)
PROGRESS NOTE  Alexander Calderon GYI:[id number] DOB: April 29, 1961 DOA: 01/16/2020 PCP: Loyal Jacobson, MD  Hospital Course/Subjective: Alexander Calderon is a 59 y.o. male with history of CAD status post stenting diabetes mellitus type 2, asthma, hypertension depression presents to the ER because of worsening shortness of breath and fatigue.  Patient states he was diagnosed with COVID-19 infection on Jan 07, 2020 10 days ago.  Patient does not have the results of the test with him.  Since then patient has been having persistent cough body aches fatigue and worsening shortness of breath.  Patient gets easily short of breath on exertion.  Has been having nonproductive cough.  Has been taking some Tylenol for subjective feeling of fever chills.  Due to worsening symptoms patient called EMS and patient was brought to the ER.  Denies any nausea vomiting or diarrhea. CT chest without PE, and patchy bilateral ground-glass opacities.  Repeat COVID test positive 5/15, he was placed on oxygen for comfort currently saturating 96% on 2L O2, having some chest pressure but denies pain. Continues with dry cough.  Assessment/Plan: Principal Problem:   Acute respiratory failure with hypoxia (HCC) Active Problems:   Hyperlipidemia   CAD (coronary artery disease), native coronary artery   Asthma   Type 2 diabetes mellitus (HCC)   1. Acute respiratory failure hypoxia/exertional dyspnea likely from COVID infection. Troponin, PE studies, etc negative. Since requiring oxygen will start on IV decadron today plan 10 day course. 2. CAD denies any chest pain but does have shortness of breath and tachypnea.  I am checking BNP and thickening cardiac markers.  Continue antiplatelet agents beta-blockers and statins. 3. Diabetes mellitus type 2 we will keep patient on sliding scale coverage. 4. Mildly elevated LFTs.  Abdomen appears benign patient denies any nausea vomiting and diarrhea could be from Covid infection.  Will need  follow-up LFTs and also check acute hepatitis panel.  If LFTs worsen may have to hold patient's statins. 5. Asthma presently not wheezing continue inhalers. 6. Anemia appears to be new.  Follow CBC.  If any worsening may need further work-up.  DVT prophylaxis: Lovenox. Code Status: Full code. Family Communication: Discussed with patient. Disposition Plan: Home. Consults called: None. Admission status: Inpatient.  Objective: Vitals:   01/17/20 2112 01/18/20 0123 01/18/20 0542 01/18/20 0944  BP: 139/76 119/86 124/76 128/75  Pulse: 76 86 86 75  Resp: (!) 24     Temp: 98 F (36.7 C) 98.6 F (37 C) 98.6 F (37 C)   TempSrc: Oral Oral Oral   SpO2: 98% 96% 96%   Weight:      Height:        Intake/Output Summary (Last 24 hours) at 01/18/2020 1051 Last data filed at 01/18/2020 0140 Gross per 24 hour  Intake 120 ml  Output 550 ml  Net -430 ml   Filed Weights   01/17/20 0029 01/17/20 2107  Weight: 71.2 kg 64.4 kg     Exam: General:  Alert, oriented, calm, in no acute distress, hypoxic and coughing Eyes: EOMI, clear sclerea Neck: supple, no masses, trachea mildline  Cardiovascular: RRR, no murmurs or rubs, no peripheral edema  Respiratory: breath sounds distant, faint basilar crackles  Abdomen: soft, nontender, nondistended, normal bowel tones heard  Skin: dry, no rashes  Musculoskeletal: no joint effusions, normal range of motion  Psychiatric: appropriate affect, normal speech  Neurologic: extraocular muscles intact, clear speech, moving all extremities with intact sensorium   Data Reviewed: CBC: Recent Labs  Lab 01/16/20 2353 01/17/20  0748  WBC 7.2 4.6  NEUTROABS 6.0 3.4  HGB 12.3* 11.2*  HCT 37.6* 34.0*  MCV 86.8 87.0  PLT 293 469   Basic Metabolic Panel: Recent Labs  Lab 01/16/20 2353 01/17/20 0748  NA 137 133*  K 4.2 3.3*  CL 98 95*  CO2 26 24  GLUCOSE 107* 90  BUN 18 15  CREATININE 0.92 0.79  CALCIUM 9.3 8.7*   GFR: Estimated Creatinine  Clearance: 88.6 mL/min (by C-G formula based on SCr of 0.79 mg/dL). Liver Function Tests: Recent Labs  Lab 01/16/20 2353 01/17/20 0748  AST 84* 68*  ALT 109* 93*  ALKPHOS 237* 195*  BILITOT 0.9 0.8  PROT 7.8 6.7  ALBUMIN 3.6 3.1*   No results for input(s): LIPASE, AMYLASE in the last 168 hours. No results for input(s): AMMONIA in the last 168 hours. Coagulation Profile: No results for input(s): INR, PROTIME in the last 168 hours. Cardiac Enzymes: No results for input(s): CKTOTAL, CKMB, CKMBINDEX, TROPONINI in the last 168 hours. BNP (last 3 results) No results for input(s): PROBNP in the last 8760 hours. HbA1C: No results for input(s): HGBA1C in the last 72 hours. CBG: Recent Labs  Lab 01/17/20 0741 01/17/20 1154 01/17/20 1740 01/18/20 0139 01/18/20 0820  GLUCAP 81 101* 85 105* 104*   Lipid Profile: No results for input(s): CHOL, HDL, LDLCALC, TRIG, CHOLHDL, LDLDIRECT in the last 72 hours. Thyroid Function Tests: No results for input(s): TSH, T4TOTAL, FREET4, T3FREE, THYROIDAB in the last 72 hours. Anemia Panel: Recent Labs    01/17/20 0748  FERRITIN 675*   Urine analysis: No results found for: COLORURINE, APPEARANCEUR, LABSPEC, PHURINE, GLUCOSEU, HGBUR, BILIRUBINUR, KETONESUR, PROTEINUR, UROBILINOGEN, NITRITE, LEUKOCYTESUR Sepsis Labs: @LABRCNTIP (procalcitonin:4,lacticidven:4)  ) Recent Results (from the past 240 hour(s))  SARS Coronavirus 2 by RT PCR (hospital order, performed in [facility] hospital lab) Nasopharyngeal Nasopharyngeal Swab     Status: Abnormal   Collection Time: 01/17/20  5:35 AM   Specimen: Nasopharyngeal Swab  Result Value Ref Range Status   SARS Coronavirus 2 POSITIVE (A) NEGATIVE Final    Comment: RESULT CALLED TO, READ BACK BY AND VERIFIED WITH: E.FLUECKIGER,RN [id number] @1226  BY V.WILKINS (NOTE) SARS-CoV-2 target nucleic acids are DETECTED SARS-CoV-2 RNA is generally detectable in upper respiratory specimens  during the acute phase of  infection.  Positive results are indicative  of the presence of the identified virus, but do not rule out bacterial infection or co-infection with other pathogens not detected by the test.  Clinical correlation with patient history and  other diagnostic information is necessary to determine patient infection status.  The expected result is negative. Fact Sheet for Patients:   StrictlyIdeas.no  Fact Sheet for Healthcare Providers:   BankingDealers.co.za   This test is not yet approved or cleared by the [place] FDA and  has been authorized for detection and/or diagnosis of SARS-CoV-2 by FDA under an Emergency Use Authorization (EUA).  This EUA will remain in effect (meaning this tes t can be used) for the duration of  the COVID-19 declaration under Section 564(b)(1) of the Act, 21 U.S.C. section 360-bbb-3(b)(1), unless the authorization is terminated or revoked sooner. Performed at [facility], [place] [address] [place], [place] [address]      Studies: No results found.  Scheduled Meds:  aspirin EC  81 mg Oral Daily   buPROPion  150 mg Oral q morning - 10a   citalopram  40 mg Oral Daily   clopidogrel  75 mg Oral Daily  enoxaparin (LOVENOX) injection  40 mg Subcutaneous Q24H   ezetimibe  10 mg Oral Daily   famotidine  40 mg Oral Daily   ferrous sulfate  325 mg Oral BID   hyoscyamine  0.375 mg Oral BID   insulin aspart  0-9 Units Subcutaneous TID WC   metoprolol succinate  50 mg Oral Daily   mirtazapine  45 mg Oral QHS   montelukast  10 mg Oral Daily   pantoprazole  40 mg Oral Daily   rosuvastatin  40 mg Oral Daily    Continuous Infusions:   LOS: 0 days   Time spent: 32 minutes  Alexander Calderon Vergie Living, MD [facility] Pager [phone]  If 7PM-7AM, please contact night-coverage [email or web address] Password [email or web address] [facility] 01/18/2020, 10:51 AM

## 2020-01-19 LAB — BASIC METABOLIC PANEL
Anion gap: 13 (ref 5–15)
BUN: 23 mg/dL — ABNORMAL HIGH (ref 6–20)
CO2: 28 mmol/L (ref 22–32)
Calcium: 9.1 mg/dL (ref 8.9–10.3)
Chloride: 97 mmol/L — ABNORMAL LOW (ref 98–111)
Creatinine, Ser: 0.87 mg/dL (ref 0.61–1.24)
GFR calc Af Amer: >60 mL/min (ref >60)
GFR calc non Af Amer: >60 mL/min (ref >60)
Glucose, Bld: 156 mg/dL — ABNORMAL HIGH (ref 70–99)
Potassium: 4.4 mmol/L (ref 3.5–5.1)
Sodium: 138 mmol/L (ref 135–145)

## 2020-01-19 LAB — TROPONIN I (HIGH SENSITIVITY)
Troponin I (High Sensitivity): 2 ng/L (ref <18)
Troponin I (High Sensitivity): 3 ng/L (ref <18)

## 2020-01-19 LAB — CBC
HCT: 36.3 % — ABNORMAL LOW (ref 39.0–52.0)
Hemoglobin: 12 g/dL — ABNORMAL LOW (ref 13.0–17.0)
MCH: 28.5 pg (ref 26.0–34.0)
MCHC: 33.1 g/dL (ref 30.0–36.0)
MCV: 86.2 fL (ref 80.0–100.0)
Platelets: 403 10*3/uL — ABNORMAL HIGH (ref 150–400)
RBC: 4.21 MIL/uL — ABNORMAL LOW (ref 4.22–5.81)
RDW: 12.2 % (ref 11.5–15.5)
WBC: 4.8 10*3/uL (ref 4.0–10.5)
nRBC: 0 % (ref 0.0–0.2)

## 2020-01-19 LAB — GLUCOSE, CAPILLARY
Glucose-Capillary: 156 mg/dL — ABNORMAL HIGH (ref 70–99)
Glucose-Capillary: 217 mg/dL — ABNORMAL HIGH (ref 70–99)
Glucose-Capillary: 297 mg/dL — ABNORMAL HIGH (ref 70–99)
Glucose-Capillary: 234 mg/dL — ABNORMAL HIGH (ref 70–99)

## 2020-01-19 MED ORDER — INSULIN ASPART 100 UNIT/ML ~~LOC~~ SOLN
0.0000 [IU] | Freq: Three times a day (TID) | SUBCUTANEOUS | Status: DC
  Administered 2020-01-20: 3 [IU] via SUBCUTANEOUS
  Administered 2020-01-20: 7 [IU] via SUBCUTANEOUS

## 2020-01-19 MED ORDER — INSULIN ASPART 100 UNIT/ML ~~LOC~~ SOLN
0.0000 [IU] | Freq: Every day | SUBCUTANEOUS | Status: DC
  Administered 2020-01-20: 2 [IU] via SUBCUTANEOUS

## 2020-01-19 MED ORDER — SUCRALFATE 1 GM/10ML PO SUSP
1.0000 g | Freq: Three times a day (TID) | ORAL | Status: DC
  Administered 2020-01-19 – 2020-01-20 (×5): 1 g via ORAL
  Filled 2020-01-19 (×5): qty 10

## 2020-01-19 NOTE — Progress Notes (Signed)
PROGRESS NOTE  Alexander Calderon ZOX:[id number] DOB: 07-23-1961 DOA: 01/16/2020 PCP: Loyal Jacobson, MD  Hospital Course/Subjective: Alexander Calderon is a 60 y.o. male with history of CAD status post stenting diabetes mellitus type 2, asthma, hypertension depression presents to the ER because of worsening shortness of breath and fatigue.  Patient states he was diagnosed with COVID-19 infection on Jan 07, 2020 10 days ago.  Patient does not have the results of the test with him.  Since then patient has been having persistent cough body aches fatigue and worsening shortness of breath.  Patient gets easily short of breath on exertion.  Has been having nonproductive cough.  Has been taking some Tylenol for subjective feeling of fever chills.  Due to worsening symptoms patient called EMS and patient was brought to the ER.  Denies any nausea vomiting or diarrhea. CT chest without PE, and patchy bilateral ground-glass opacities.  Repeat COVID test positive 5/15, he was placed on oxygen for comfort currently saturating 96% on 2L O2, having some chest pressure but denies pain. Continues with dry cough, and having lots of burping and chest tightness with meals. He says he does have a history of bad GERD.  Assessment/Plan: Principal Problem:   Acute respiratory failure with hypoxia (HCC) Active Problems:   Hyperlipidemia   CAD (coronary artery disease), native coronary artery   Asthma   Type 2 diabetes mellitus (HCC)   1. Acute respiratory failure hypoxia/exertional dyspnea likely from COVID infection. Troponin, PE studies, etc negative. Since requiring oxygen will start on IV decadron 5/16 plan 10 day course. 2. CAD denies any chest pain but does have shortness of breath and tachypnea.  I am checking BNP and thickening cardiac markers.  Continue antiplatelet agents beta-blockers and statins. 3. Diabetes mellitus type 2 we will keep patient on sliding scale coverage. 4. Mildly elevated LFTs.  Abdomen appears  benign patient denies any nausea vomiting and diarrhea could be from Covid infection.  Will need follow-up LFTs and also check acute hepatitis panel.  If LFTs worsen may have to hold patient's statins. 5. Chest tightness, burping - most likely GERD, will add sucralfate, note CT chest done at admission with no acute findings, will also check a troponin (was neg at admission) and consider GI consult if still with lots of pain and discomfort despite sucralfate 6. Asthma presently not wheezing continue inhalers. 7. Anemia appears to be new.  Follow CBC.  If any worsening may need further work-up.  DVT prophylaxis: Lovenox. Code Status: Full code. Family Communication: Discussed with patient. Disposition Plan: Home. Consults called: None. Admission status: Inpatient.  Objective: Vitals:   01/18/20 0944 01/18/20 1522 01/18/20 2111 01/19/20 0531  BP: 128/75 122/79 122/74 115/89  Pulse: 75 72 80 66  Resp:  16 19 20   Temp:  (!) 97.5 F (36.4 C) 98.6 F (37 C) 97.6 F (36.4 C)  TempSrc:  Oral Oral Oral  SpO2:  97% 93% 95%  Weight:      Height:        Intake/Output Summary (Last 24 hours) at 01/19/2020 0931 Last data filed at 01/18/2020 2342 Gross per 24 hour  Intake 90 ml  Output 500 ml  Net -410 ml   Filed Weights   01/17/20 0029 01/17/20 2107  Weight: 71.2 kg 64.4 kg     Exam: General:  Alert, oriented, calm, in no acute distress, hypoxic and coughing Eyes: EOMI, clear sclerea Neck: supple, no masses, trachea mildline  Cardiovascular: RRR, no murmurs or rubs, no  peripheral edema  Respiratory: breath sounds distant, faint basilar crackles  Abdomen: soft, nontender, nondistended, normal bowel tones heard  Skin: dry, no rashes  Musculoskeletal: no joint effusions, normal range of motion  Psychiatric: appropriate affect, normal speech  Neurologic: extraocular muscles intact, clear speech, moving all extremities with intact sensorium   Data Reviewed: CBC: Recent Labs  Lab  01/16/20 2353 01/17/20 0748 01/19/20 0353  WBC 7.2 4.6 4.8  NEUTROABS 6.0 3.4  --   HGB 12.3* 11.2* 12.0*  HCT 37.6* 34.0* 36.3*  MCV 86.8 87.0 86.2  PLT 293 274 403*   Basic Metabolic Panel: Recent Labs  Lab 01/16/20 2353 01/17/20 0748 01/18/20 1141 01/19/20 0353  NA 137 133* 140 138  K 4.2 3.3* 4.2 4.4  CL 98 95* 98 97*  CO2 26 24 29 28   GLUCOSE 107* 90 111* 156*  BUN 18 15 19  23*  CREATININE 0.92 0.79 0.83 0.87  CALCIUM 9.3 8.7* 9.1 9.1   GFR: Estimated Creatinine Clearance: 81.5 mL/min (by C-G formula based on SCr of 0.87 mg/dL). Liver Function Tests: Recent Labs  Lab 01/16/20 2353 01/17/20 0748  AST 84* 68*  ALT 109* 93*  ALKPHOS 237* 195*  BILITOT 0.9 0.8  PROT 7.8 6.7  ALBUMIN 3.6 3.1*   No results for input(s): LIPASE, AMYLASE in the last 168 hours. No results for input(s): AMMONIA in the last 168 hours. Coagulation Profile: No results for input(s): INR, PROTIME in the last 168 hours. Cardiac Enzymes: No results for input(s): CKTOTAL, CKMB, CKMBINDEX, TROPONINI in the last 168 hours. BNP (last 3 results) No results for input(s): PROBNP in the last 8760 hours. HbA1C: No results for input(s): HGBA1C in the last 72 hours. CBG: Recent Labs  Lab 01/18/20 0820 01/18/20 1143 01/18/20 1729 01/18/20 2106 01/19/20 0759  GLUCAP 104* 109* 163* 283* 156*   Lipid Profile: No results for input(s): CHOL, HDL, LDLCALC, TRIG, CHOLHDL, LDLDIRECT in the last 72 hours. Thyroid Function Tests: No results for input(s): TSH, T4TOTAL, FREET4, T3FREE, THYROIDAB in the last 72 hours. Anemia Panel: Recent Labs    01/17/20 0748  FERRITIN 675*   Urine analysis: No results found for: COLORURINE, APPEARANCEUR, LABSPEC, PHURINE, GLUCOSEU, HGBUR, BILIRUBINUR, KETONESUR, PROTEINUR, UROBILINOGEN, NITRITE, LEUKOCYTESUR Sepsis Labs: @LABRCNTIP (procalcitonin:4,lacticidven:4)  ) Recent Results (from the past 240 hour(s))  SARS Coronavirus 2 by RT PCR (hospital order,  performed in [facility] hospital lab) Nasopharyngeal Nasopharyngeal Swab     Status: Abnormal   Collection Time: 01/17/20  5:35 AM   Specimen: Nasopharyngeal Swab  Result Value Ref Range Status   SARS Coronavirus 2 POSITIVE (A) NEGATIVE Final    Comment: RESULT CALLED TO, READ BACK BY AND VERIFIED WITH: E.FLUECKIGER,RN @1226  BY V.WILKINS (NOTE) SARS-CoV-2 target nucleic acids are DETECTED SARS-CoV-2 RNA is generally detectable in upper respiratory specimens  during the acute phase of infection.  Positive results are indicative  of the presence of the identified virus, but do not rule out bacterial infection or co-infection with other pathogens not detected by the test.  Clinical correlation with patient history and  other diagnostic information is necessary to determine patient infection status.  The expected result is negative. Fact Sheet for Patients:   [facility]  Fact Sheet for Healthcare Providers:   01/19/20   This test is not yet approved or cleared by the [id number] FDA and  has been authorized for detection and/or diagnosis of SARS-CoV-2 by FDA under an Emergency Use Authorization (EUA).  This EUA will remain in effect (  meaning this tes t can be used) for the duration of  the COVID-19 declaration under Section 564(b)(1) of the Act, 21 U.S.C. section 360-bbb-3(b)(1), unless the authorization is terminated or revoked sooner. Performed at [facility], [place] [address], [place], [place] [address]      Studies: No results found.  Scheduled Meds: . aspirin EC  81 mg Oral Daily  . buPROPion  150 mg Oral q morning - 10a  . citalopram  40 mg Oral Daily  . clopidogrel  75 mg Oral Daily  . dexamethasone (DECADRON) injection  6 mg Intravenous Q12H  . enoxaparin (LOVENOX) injection  40 mg Subcutaneous Q24H  . ezetimibe  10 mg Oral Daily  . famotidine  40 mg Oral Daily  . ferrous  sulfate  325 mg Oral BID  . hyoscyamine  0.375 mg Oral BID  . insulin aspart  0-9 Units Subcutaneous TID WC  . metoprolol succinate  50 mg Oral Daily  . mirtazapine  45 mg Oral QHS  . montelukast  10 mg Oral Daily  . pantoprazole  40 mg Oral Daily  . rosuvastatin  40 mg Oral Daily  . sucralfate  1 g Oral TID WC & HS    Continuous Infusions:   LOS: 1 day   Time spent: 21 minutes  Corlis Angelica Marry Guan, MD [facility] Pager [phone]  If 7PM-7AM, please contact night-coverage [email or web address] Password [facility] 01/19/2020, 9:31 AM

## 2020-01-20 LAB — GLUCOSE, CAPILLARY
Glucose-Capillary: 219 mg/dL — ABNORMAL HIGH (ref 70–99)
Glucose-Capillary: 222 mg/dL — ABNORMAL HIGH (ref 70–99)
Glucose-Capillary: 350 mg/dL — ABNORMAL HIGH (ref 70–99)

## 2020-01-20 LAB — CBC
HCT: 36.9 % — ABNORMAL LOW (ref 39.0–52.0)
Hemoglobin: 12.4 g/dL — ABNORMAL LOW (ref 13.0–17.0)
MCH: 29.1 pg (ref 26.0–34.0)
MCHC: 33.6 g/dL (ref 30.0–36.0)
MCV: 86.6 fL (ref 80.0–100.0)
Platelets: 450 10*3/uL — ABNORMAL HIGH (ref 150–400)
RBC: 4.26 MIL/uL (ref 4.22–5.81)
RDW: 12.1 % (ref 11.5–15.5)
WBC: 9.7 10*3/uL (ref 4.0–10.5)
nRBC: 0 % (ref 0.0–0.2)

## 2020-01-20 LAB — BASIC METABOLIC PANEL
Anion gap: 13 (ref 5–15)
BUN: 28 mg/dL — ABNORMAL HIGH (ref 6–20)
CO2: 28 mmol/L (ref 22–32)
Calcium: 9.3 mg/dL (ref 8.9–10.3)
Chloride: 99 mmol/L (ref 98–111)
Creatinine, Ser: 0.98 mg/dL (ref 0.61–1.24)
GFR calc Af Amer: >60 mL/min (ref >60)
GFR calc non Af Amer: >60 mL/min (ref >60)
Glucose, Bld: 235 mg/dL — ABNORMAL HIGH (ref 70–99)
Potassium: 4.2 mmol/L (ref 3.5–5.1)
Sodium: 140 mmol/L (ref 135–145)

## 2020-01-20 MED ORDER — SUCRALFATE 1 GM/10ML PO SUSP: 1.0000 g | Freq: Three times a day (TID) | ORAL | 0 refills | Status: DC

## 2020-01-20 MED ORDER — GUAIFENESIN-DM 100-10 MG/5ML PO SYRP: 10.0000 mL | ORAL_SOLUTION | ORAL | 0 refills | Status: DC | PRN

## 2020-01-20 MED ORDER — DEXAMETHASONE 6 MG PO TABS
6.0000 mg | ORAL_TABLET | Freq: Every day | ORAL | 0 refills | Status: DC
Start: 2020-01-20 — End: 2020-09-20

## 2020-01-20 MED ORDER — HYDROCOD POLST-CPM POLST ER 10-8 MG/5ML PO SUER: 5.0000 mL | Freq: Two times a day (BID) | ORAL | 0 refills | Status: DC | PRN

## 2020-01-20 NOTE — Progress Notes (Signed)
PROGRESS NOTE    Alexander Calderon  TDD:[id number] DOB: Jul 06, 1961 DOA: 01/16/2020 PCP: Loyal Jacobson, MD   Brief Narrative:59 y.o.malewithhistory of CAD status post stenting diabetes mellitus type 2, asthma, hypertension depression presents to the ER because of worsening shortness of breath and fatigue. Patient states he was diagnosed with COVID-19 infection on Jan 07, 2020 10 days ago. Patient does not have the results of the test with him. Since then patient has been having persistent cough body aches fatigue and worsening shortness of breath. Patient gets easily short of breath on exertion. Has been having nonproductive cough. Has been taking some Tylenol for subjective feeling of fever chills. Due to worsening symptoms patient called EMS and patient was brought to the ER. Denies any nausea vomiting or diarrhea. CT chest without PE, and patchy bilateral ground-glass opacities.  Repeat COVID test positive 5/15, he was placed on oxygen for comfort currently saturating 96% on 2L O2, having some chest pressure but denies pain. Continues with dry cough, and having lots of burping and chest tightness with meals. He says he does have a history of bad GERD.   Assessment & Plan:   Principal Problem:   Acute respiratory failure with hypoxia (HCC) Active Problems:   Hyperlipidemia   CAD (coronary artery disease), native coronary artery   Asthma   Type 2 diabetes mellitus (HCC)   Hypoxia  #1 Covid pneumonia-continue Decadron for a total of 10 days.  Patient was initially diagnosed with Covid on May 5 at an outside facility.  He continues to have cough chest tightness.  He is learned about going home today however he is agreeable to go home tomorrow. Cardiac enzymes are negative. CT chest shows groundglass opacities no evidence of PE noted. CRP 10.7, procalcitonin 0.10, BNP 17.8 troponin 4, 2, 3, D-dimer 0.61.  #2 severe GERD continue PPI.  #3 type 2 diabetes continue SSI CBG (last 3)    Recent Labs    01/20/20 0034 01/20/20 0751 01/20/20 1220  GLUCAP 219* 222* 350*     #4 mildly elevated LFTs secondary to Covid  #5 history of asthma continue inhalers  #6 history of CAD on aspirin and Plavix  #7 hyperlipidemia on Zetia and Crestor  #8 depression on bupropion  #9 htn on metoprolol 50 mg daily.bp 117/66  Estimated body mass index is 23.63 kg/m as calculated from the following:   Height as of this encounter: 5\' 5"  (1.651 m).   Weight as of this encounter: 64.4 kg.  DVT prophylaxis: Lovenox  code Status: Full code Family Communication discussed with patient disposition Plan:  Status is: Inpatient  Dispo: The patient is from: Home              Anticipated d/c is to: Home              Anticipated d/c date is: 1 to 2 days              Patient currently is not medically stable to d/c.  Patient continues with severe cough and is hesitant to go home today.    Consultants:   None  Procedures: None Antimicrobials none Subjective: Continues to have cough reports a mask make him cough more talking increases his cough short of breath when ambulating in the room  Objective: Vitals:   01/19/20 1231 01/19/20 2153 01/20/20 0600 01/20/20 1148  BP: 121/77 130/81 (!) 107/59 117/66  Pulse: 71 72 60 64  Resp: 20 15 16 18   Temp: 98.2 F (36.8 C)  98.6 F (37 C) 98.5 F (36.9 C) 98.1 F (36.7 C)  TempSrc: Oral Oral Oral   SpO2: 93% 93% 93% 96%  Weight:      Height:       No intake or output data in the 24 hours ending 01/20/20 1307 Filed Weights   01/17/20 0029 01/17/20 2107  Weight: 71.2 kg 64.4 kg    Examination:  General exam: Appears calm and comfortable  Respiratory system: Coarse breath sounds  to auscultation. Respiratory effort normal. Cardiovascular system: S1 & S2 heard, RRR. No JVD, murmurs, rubs, gallops or clicks. No pedal edema. Gastrointestinal system: Abdomen is nondistended, soft and nontender. No organomegaly or masses felt. Normal  bowel sounds heard. Central nervous system: Alert and oriented. No focal neurological deficits. Extremities: Symmetric 5 x 5 power. Skin: No rashes, lesions or ulcers Psychiatry: Judgement and insight appear normal. Mood & affect appropriate.     Data Reviewed: I have personally reviewed following labs and imaging studies  CBC: Recent Labs  Lab 01/16/20 2353 01/17/20 0748 01/19/20 0353 01/20/20 0038  WBC 7.2 4.6 4.8 9.7  NEUTROABS 6.0 3.4  --   --   HGB 12.3* 11.2* 12.0* 12.4*  HCT 37.6* 34.0* 36.3* 36.9*  MCV 86.8 87.0 86.2 86.6  PLT 293 274 403* 448*   Basic Metabolic Panel: Recent Labs  Lab 01/16/20 2353 01/17/20 0748 01/18/20 1141 01/19/20 0353 01/20/20 0038  NA 137 133* 140 138 140  K 4.2 3.3* 4.2 4.4 4.2  CL 98 95* 98 97* 99  CO2 26 24 29 28 28   GLUCOSE 107* 90 111* 156* 235*  BUN 18 15 19  23* 28*  CREATININE 0.92 0.79 0.83 0.87 0.98  CALCIUM 9.3 8.7* 9.1 9.1 9.3   GFR: Estimated Creatinine Clearance: 72.3 mL/min (by C-G formula based on SCr of 0.98 mg/dL). Liver Function Tests: Recent Labs  Lab 01/16/20 2353 01/17/20 0748  AST 84* 68*  ALT 109* 93*  ALKPHOS 237* 195*  BILITOT 0.9 0.8  PROT 7.8 6.7  ALBUMIN 3.6 3.1*   No results for input(s): LIPASE, AMYLASE in the last 168 hours. No results for input(s): AMMONIA in the last 168 hours. Coagulation Profile: No results for input(s): INR, PROTIME in the last 168 hours. Cardiac Enzymes: No results for input(s): CKTOTAL, CKMB, CKMBINDEX, TROPONINI in the last 168 hours. BNP (last 3 results) No results for input(s): PROBNP in the last 8760 hours. HbA1C: No results for input(s): HGBA1C in the last 72 hours. CBG: Recent Labs  Lab 01/19/20 1657 01/19/20 2150 01/20/20 0034 01/20/20 0751 01/20/20 1220  GLUCAP 297* 234* 219* 222* 350*   Lipid Profile: No results for input(s): CHOL, HDL, LDLCALC, TRIG, CHOLHDL, LDLDIRECT in the last 72 hours. Thyroid Function Tests: No results for input(s): TSH,  T4TOTAL, FREET4, T3FREE, THYROIDAB in the last 72 hours. Anemia Panel: No results for input(s): VITAMINB12, FOLATE, FERRITIN, TIBC, IRON, RETICCTPCT in the last 72 hours. Sepsis Labs: Recent Labs  Lab 01/17/20 0748  PROCALCITON <0.10    Recent Results (from the past 240 hour(s))  SARS Coronavirus 2 by RT PCR (hospital order, performed in [facility] hospital lab) Nasopharyngeal Nasopharyngeal Swab     Status: Abnormal   Collection Time: 01/17/20  5:35 AM   Specimen: Nasopharyngeal Swab  Result Value Ref Range Status   SARS Coronavirus 2 POSITIVE (A) NEGATIVE Final    Comment: RESULT CALLED TO, READ BACK BY AND VERIFIED WITH: E.FLUECKIGER,RN [id number] @1226  BY V.WILKINS (NOTE) SARS-CoV-2 target nucleic acids are DETECTED  SARS-CoV-2 RNA is generally detectable in upper respiratory specimens  during the acute phase of infection.  Positive results are indicative  of the presence of the identified virus, but do not rule out bacterial infection or co-infection with other pathogens not detected by the test.  Clinical correlation with patient history and  other diagnostic information is necessary to determine patient infection status.  The expected result is negative. Fact Sheet for Patients:   [employer]cy  Fact Sheet for Healthcare Providers:   https://pope.com/   This test is not yet approved or cleared by the [place] FDA and  has been authorized for detection and/or diagnosis of SARS-CoV-2 by FDA under an Emergency Use Authorization (EUA).  This EUA will remain in effect (meaning this tes t can be used) for the duration of  the COVID-19 declaration under Section 564(b)(1) of the Act, 21 U.S.C. section 360-bbb-3(b)(1), unless the authorization is terminated or revoked sooner. Performed at [facility], [address] [place], [place] [address]          Radiology Studies: No results  found.      Scheduled Meds: . aspirin EC  81 mg Oral Daily  . buPROPion  150 mg Oral q morning - 10a  . citalopram  40 mg Oral Daily  . clopidogrel  75 mg Oral Daily  . dexamethasone (DECADRON) injection  6 mg Intravenous Q12H  . enoxaparin (LOVENOX) injection  40 mg Subcutaneous Q24H  . ezetimibe  10 mg Oral Daily  . famotidine  40 mg Oral Daily  . ferrous sulfate  325 mg Oral BID  . hyoscyamine  0.375 mg Oral BID  . insulin aspart  0-5 Units Subcutaneous QHS  . insulin aspart  0-9 Units Subcutaneous TID WC  . metoprolol succinate  50 mg Oral Daily  . mirtazapine  45 mg Oral QHS  . montelukast  10 mg Oral Daily  . pantoprazole  40 mg Oral Daily  . rosuvastatin  40 mg Oral Daily  . sucralfate  1 g Oral TID WC & HS   Continuous Infusions:   LOS: 2 days    Alwyn Ren, MD 01/20/2020, 1:07 PM

## 2020-01-21 LAB — HEMOGLOBIN A1C
Hgb A1c MFr Bld: 6.9 % — ABNORMAL HIGH (ref 4.8–5.6)
Mean Plasma Glucose: 151 mg/dL

## 2020-01-21 NOTE — Discharge Summary (Signed)
Physician Discharge Summary  Alexander Calderon EXB:[id number] DOB: 02/28/1961 DOA: 01/16/2020  PCP: Loyal JacobsonKalish, Muhammadali, MD  Admit date: 01/16/2020 Discharge date: 01/21/2020  Admitted From: Home Disposition: Home Recommendations for Outpatient Follow-up:  1. Follow up with PCP in 1-2 weeks 2. Please obtain BMP/CBC in one week  Home Health: None Equipment/Devices: None Discharge Condition: Stable CODE STATUS: Full code Diet recommendation: Cardiac Brief/Interim Summary:59 y.o.malewithhistory of CAD status post stenting diabetes mellitus type 2, asthma, hypertension depression presents to the ER because of worsening shortness of breath and fatigue. Patient states he was diagnosed with COVID-19 infection on Jan 07, 2020 10 days ago. Patient does not have the results of the test with him. Since then patient has been having persistent cough body aches fatigue and worsening shortness of breath. Patient gets easily short of breath on exertion. Has been having nonproductive cough. Has been taking some Tylenol for subjective feeling of fever chills. Due to worsening symptoms patient called EMS and patient was brought to the ER. Denies any nausea vomiting or diarrhea. CT chest without PE, and patchy bilateral ground-glass opacities.  Repeat COVID test positive 5/15, he was placed on oxygen for comfort currently saturating 96% on 2L O2, having some chest pressure but denies pain. Continues with dry cough, and having lots of burping and chest tightness with meals. He says he does have a history of bad GERD.   Discharge Diagnoses:  Principal Problem:   Acute respiratory failure with hypoxia (HCC) Active Problems:   Hyperlipidemia   CAD (coronary artery disease), native coronary artery   Asthma   Type 2 diabetes mellitus (HCC)   Hypoxia  #1 Covid pneumonia-continue Decadron for a total of 10 days.  Patient was initially diagnosed with Covid on May 5 at an outside facility.  He continues to have  cough chest tightness.  He finally agreed to go home on the same day after I saw him.  Cardiac enzymes are negative. CT chest shows groundglass opacities no evidence of PE noted. CRP 10.7, procalcitonin 0.10, BNP 17.8 troponin 4, 2, 3, D-dimer 0.61.  #2 severe GERD continue PPI.  #3 type 2 diabetes continue SSI CBG (last 3)  Recent Labs (last 2 labs)        Recent Labs    01/20/20 0034 01/20/20 0751 01/20/20 1220  GLUCAP 219* 222* 350*       #4 mildly elevated LFTs secondary to Covid  #5 history of asthma continue inhalers  #6 history of CAD on aspirin and Plavix  #7 hyperlipidemia on Zetia and Crestor  #8 depression on bupropion  #9 htn on metoprolol 50 mg daily.bp 117/66    Estimated body mass index is 23.63 kg/m as calculated from the following:   Height as of this encounter: 5\' 5"  (1.651 m).   Weight as of this encounter: 64.4 kg.  Discharge Instructions  Discharge Instructions    Diet - low sodium heart healthy   Complete by: As directed    Increase activity slowly   Complete by: As directed      Allergies as of 01/20/2020      Reactions   Dextrans Nausea Only      Medication List    TAKE these medications   albuterol 108 (90 Base) MCG/ACT inhaler Commonly known as: VENTOLIN HFA Inhale 2 puffs into the lungs every 6 (six) hours as needed for wheezing.   aspirin 81 MG tablet Take 81 mg by mouth daily.   Azelastine HCl 0.15 % Soln Place 1 spray  into the nose as needed (allergies).   buPROPion 150 MG 24 hr tablet Commonly known as: WELLBUTRIN XL Take 150 mg by mouth every morning. Take 3 tablets every morning   chlorpheniramine-HYDROcodone 10-8 MG/5ML Suer Commonly known as: TUSSIONEX Take 5 mLs by mouth every 12 (twelve) hours as needed for cough.   citalopram 40 MG tablet Commonly known as: CELEXA Take 40 mg by mouth daily.   clopidogrel 75 MG tablet Commonly known as: PLAVIX TAKE ONE (1) TABLET BY MOUTH EVERY DAY    dexamethasone 6 MG tablet Commonly known as: DECADRON Take 1 tablet (6 mg total) by mouth daily.   ezetimibe 10 MG tablet Commonly known as: ZETIA Take 10 mg by mouth daily.   famotidine 20 MG tablet Commonly known as: PEPCID Take 2 tablets by mouth as directed.   ferrous sulfate 325 (65 FE) MG tablet Take 325 mg by mouth 2 (two) times daily.   fluticasone 50 MCG/ACT nasal spray Commonly known as: FLONASE Place 1 spray into both nostrils as needed for allergies.   Gaviscon 80-14.2 MG Chew Generic drug: Alum Hydroxide-Mag Trisilicate Chew 1 tablet by mouth daily.   guaiFENesin-dextromethorphan 100-10 MG/5ML syrup Commonly known as: ROBITUSSIN DM Take 10 mLs by mouth every 4 (four) hours as needed for cough.   HYDROcodone-acetaminophen 5-325 MG tablet Commonly known as: NORCO/VICODIN Take 1 tablet by mouth every 4 (four) hours as needed for moderate pain (cough).   hyoscyamine 0.375 MG 12 hr tablet Commonly known as: LEVBID Take 0.375 mg by mouth 2 (two) times daily.   LORazepam 1 MG tablet Commonly known as: ATIVAN Take 1 mg by mouth every 8 (eight) hours as needed for anxiety.   metFORMIN 500 MG 24 hr tablet Commonly known as: GLUCOPHAGE-XR Take 2 tablets by mouth daily.   metoprolol succinate 50 MG 24 hr tablet Commonly known as: TOPROL-XL Take 1 tablet by mouth daily.   mirtazapine 45 MG tablet Commonly known as: REMERON Take 1 tab at night   nitroGLYCERIN 0.4 MG SL tablet Commonly known as: NITROSTAT DISSOLVE 1 TABLET UNDER TONGUE EVERY 5 MINUTES FOR UP TO 3 DOSES AS NEEDED FOR CHEST PAIN   omeprazole 40 MG capsule Commonly known as: PRILOSEC Take 40 mg by mouth daily.   rosuvastatin 40 MG tablet Commonly known as: CRESTOR Take 40 mg by mouth daily.   sildenafil 20 MG tablet Commonly known as: REVATIO Take 40-100 mg by mouth as needed (erectile dysfunction).   Singulair 10 MG tablet Generic drug: montelukast Take 10 mg by mouth daily.    sitaGLIPtin 100 MG tablet Commonly known as: JANUVIA Take 100 mg by mouth daily.   sucralfate 1 GM/10ML suspension Commonly known as: CARAFATE Take 10 mLs (1 g total) by mouth 4 (four) times daily -  with meals and at bedtime.       Allergies  Allergen Reactions  . Dextrans Nausea Only    Consultations: None Procedures/Studies: CT ANGIO CHEST PE W OR WO CONTRAST  Result Date: 01/17/2020 CLINICAL DATA:  Shortness of breath. COVID positive. Concern for PE. EXAM: CT ANGIOGRAPHY CHEST WITH CONTRAST TECHNIQUE: Multidetector CT imaging of the chest was performed using the standard protocol during bolus administration of intravenous contrast. Multiplanar CT image reconstructions and MIPs were obtained to evaluate the vascular anatomy. CONTRAST:  OMNIPAQUE IOHEXOL 350 MG/ML SOLN COMPARISON:  None. FINDINGS: Cardiovascular: There is no pulmonary embolism identified within the main, lobar or segmental pulmonary arteries bilaterally. No thoracic aortic aneurysm or evidence of  aortic dissection. No pericardial effusion. Mediastinum/Nodes: No mass or enlarged lymph nodes within the mediastinum or perihilar regions. Esophagus appears normal. Trachea and central bronchi are unremarkable. Lungs/Pleura: Patchy ground-glass opacities throughout both lungs, RIGHT greater than LEFT. Additional patchy consolidations at the lung bases, most likely atelectasis. Upper Abdomen: Limited images of the upper abdomen are unremarkable. Musculoskeletal: No acute or suspicious osseous finding. Review of the MIP images confirms the above findings. IMPRESSION: 1. Patchy ground-glass opacities throughout both lungs, RIGHT greater than LEFT, compatible with multifocal pneumonia, compatible with COVID-19 pneumonia. Additional probable atelectasis at each lung base. 2. No pulmonary embolism. Electronically Signed   By: Bary Richard M.D.   On: 01/17/2020 08:36   DG Chest Port 1 View  Result Date: 01/17/2020 CLINICAL  DATA:  Shortness of breath EXAM: PORTABLE CHEST 1 VIEW COMPARISON:  11/16/2013 FINDINGS: The heart size and mediastinal contours are within normal limits. Both lungs are clear. The visualized skeletal structures are unremarkable. IMPRESSION: No active disease. Electronically Signed   By: Jasmine Pang M.D.   On: 01/17/2020 00:16    (Echo, Carotid, EGD, Colonoscopy, ERCP)    Subjective:  Resting in bed no acute distress continues to have a cough on room air normal saturation Discharge Exam: Vitals:   01/20/20 0600 01/20/20 1148  BP: (!) 107/59 117/66  Pulse: 60 64  Resp: 16 18  Temp: 98.5 F (36.9 C) 98.1 F (36.7 C)  SpO2: 93% 96%   Vitals:   01/19/20 1231 01/19/20 2153 01/20/20 0600 01/20/20 1148  BP: 121/77 130/81 (!) 107/59 117/66  Pulse: 71 72 60 64  Resp: 20 15 16 18   Temp: 98.2 F (36.8 C) 98.6 F (37 C) 98.5 F (36.9 C) 98.1 F (36.7 C)  TempSrc: Oral Oral Oral   SpO2: 93% 93% 93% 96%  Weight:      Height:        General: Pt is alert, awake, not in acute distress Cardiovascular: RRR, S1/S2 +, no rubs, no gallops Respiratory: CTA bilaterally, no wheezing, no rhonchi Abdominal: Soft, NT, ND, bowel sounds + Extremities: no edema, no cyanosis    The results of significant diagnostics from this hospitalization (including imaging, microbiology, ancillary and laboratory) are listed below for reference.     Microbiology: Recent Results (from the past 240 hour(s))  SARS Coronavirus 2 by RT PCR (hospital order, performed in [facility] hospital lab) Nasopharyngeal Nasopharyngeal Swab     Status: Abnormal   Collection Time: 01/17/20  5:35 AM   Specimen: Nasopharyngeal Swab  Result Value Ref Range Status   SARS Coronavirus 2 POSITIVE (A) NEGATIVE Final    Comment: RESULT CALLED TO, READ BACK BY AND VERIFIED WITH: E.FLUECKIGER,RN 01/19/20 @1226  BY V.WILKINS (NOTE) SARS-CoV-2 target nucleic acids are DETECTED SARS-CoV-2 RNA is generally detectable in upper respiratory  specimens  during the acute phase of infection.  Positive results are indicative  of the presence of the identified virus, but do not rule out bacterial infection or co-infection with other pathogens not detected by the test.  Clinical correlation with patient history and  other diagnostic information is necessary to determine patient infection status.  The expected result is negative. Fact Sheet for Patients:   161096  Fact Sheet for Healthcare Providers:     This test is not yet approved or cleared by the [employer] FDA and  has been authorized for detection and/or diagnosis of SARS-CoV-2 by FDA under an Emergency Use Authorization (EUA).  This EUA will remain in  effect (meaning this tes t can be used) for the duration of  the COVID-19 declaration under Section 564(b)(1) of the Act, 21 U.S.C. section 360-bbb-3(b)(1), unless the authorization is terminated or revoked sooner. Performed at [facility], [address] [place], [place] [address]      Labs: BNP (last 3 results) Recent Labs    01/17/20 0748  BNP 17.8   Basic Metabolic Panel: Recent Labs  Lab 01/16/20 2353 01/17/20 0748 01/18/20 1141 01/19/20 0353 01/20/20 0038  NA 137 133* 140 138 140  K 4.2 3.3* 4.2 4.4 4.2  CL 98 95* 98 97* 99  CO2 26 24 29 28 28   GLUCOSE 107* 90 111* 156* 235*  BUN 18 15 19  23* 28*  CREATININE 0.92 0.79 0.83 0.87 0.98  CALCIUM 9.3 8.7* 9.1 9.1 9.3   Liver Function Tests: Recent Labs  Lab 01/16/20 2353 01/17/20 0748  AST 84* 68*  ALT 109* 93*  ALKPHOS 237* 195*  BILITOT 0.9 0.8  PROT 7.8 6.7  ALBUMIN 3.6 3.1*   No results for input(s): LIPASE, AMYLASE in the last 168 hours. No results for input(s): AMMONIA in the last 168 hours. CBC: Recent Labs  Lab 01/16/20 2353 01/17/20 0748 01/19/20 0353 01/20/20 0038  WBC 7.2 4.6 4.8 9.7  NEUTROABS 6.0 3.4  --   --   HGB 12.3*  11.2* 12.0* 12.4*  HCT 37.6* 34.0* 36.3* 36.9*  MCV 86.8 87.0 86.2 86.6  PLT 293 274 403* 450*   Cardiac Enzymes: No results for input(s): CKTOTAL, CKMB, CKMBINDEX, TROPONINI in the last 168 hours. BNP: Invalid input(s): POCBNP CBG: Recent Labs  Lab 01/19/20 1657 01/19/20 2150 01/20/20 0034 01/20/20 0751 01/20/20 1220  GLUCAP 297* 234* 219* 222* 350*   D-Dimer No results for input(s): DDIMER in the last 72 hours. Hgb A1c Recent Labs    01/20/20 0038  HGBA1C 6.9*   Lipid Profile No results for input(s): CHOL, HDL, LDLCALC, TRIG, CHOLHDL, LDLDIRECT in the last 72 hours. Thyroid function studies No results for input(s): TSH, T4TOTAL, T3FREE, THYROIDAB in the last 72 hours.  Invalid input(s): FREET3 Anemia work up No results for input(s): VITAMINB12, FOLATE, FERRITIN, TIBC, IRON, RETICCTPCT in the last 72 hours. Urinalysis No results found for: COLORURINE, APPEARANCEUR, LABSPEC, PHURINE, GLUCOSEU, HGBUR, BILIRUBINUR, KETONESUR, PROTEINUR, UROBILINOGEN, NITRITE, LEUKOCYTESUR Sepsis Labs Invalid input(s): PROCALCITONIN,  WBC,  LACTICIDVEN Microbiology Recent Results (from the past 240 hour(s))  SARS Coronavirus 2 by RT PCR (hospital order, performed in [facility] hospital lab) Nasopharyngeal Nasopharyngeal Swab     Status: Abnormal   Collection Time: 01/17/20  5:35 AM   Specimen: Nasopharyngeal Swab  Result Value Ref Range Status   SARS Coronavirus 2 POSITIVE (A) NEGATIVE Final    Comment: RESULT CALLED TO, READ BACK BY AND VERIFIED WITH: E.FLUECKIGER,RN [facility] @1226  BY V.WILKINS (NOTE) SARS-CoV-2 target nucleic acids are DETECTED SARS-CoV-2 RNA is generally detectable in upper respiratory specimens  during the acute phase of infection.  Positive results are indicative  of the presence of the identified virus, but do not rule out bacterial infection or co-infection with other pathogens not detected by the test.  Clinical correlation with patient history and  other  diagnostic information is necessary to determine patient infection status.  The expected result is negative. Fact Sheet for Patients:   01/19/20  Fact Sheet for Healthcare Providers:   998338   This test is not yet approved or cleared by the FDA and  has been authorized for  detection and/or diagnosis of SARS-CoV-2 by FDA under an Emergency Use Authorization (EUA).  This EUA will remain in effect (meaning this tes t can be used) for the duration of  the COVID-19 declaration under Section 564(b)(1) of the Act, 21 U.S.C. section 360-bbb-3(b)(1), unless the authorization is terminated or revoked sooner. Performed at [facility], [place] [address], [place], [place] [address]      Time coordinating discharge:  39 minutes  SIGNED:   Georgette Shell, MD  [facility] 01/21/2020, 5:16 PM Pager   If 7PM-7AM, please contact night-coverage [email or web address] Password [email or web address]

## 2020-03-01 ENCOUNTER — Other Ambulatory Visit: Payer: Self-pay

## 2020-03-01 DIAGNOSIS — S8991XA Unspecified injury of right lower leg, initial encounter: Secondary | ICD-10-CM | POA: Insufficient documentation

## 2020-03-01 DIAGNOSIS — Z79899 Other long term (current) drug therapy: Secondary | ICD-10-CM | POA: Insufficient documentation

## 2020-03-01 DIAGNOSIS — I119 Hypertensive heart disease without heart failure: Secondary | ICD-10-CM | POA: Diagnosis not present

## 2020-03-01 DIAGNOSIS — Z7984 Long term (current) use of oral hypoglycemic drugs: Secondary | ICD-10-CM | POA: Insufficient documentation

## 2020-03-01 DIAGNOSIS — J45909 Unspecified asthma, uncomplicated: Secondary | ICD-10-CM | POA: Diagnosis not present

## 2020-03-01 DIAGNOSIS — E119 Type 2 diabetes mellitus without complications: Secondary | ICD-10-CM | POA: Diagnosis not present

## 2020-03-01 DIAGNOSIS — Y939 Activity, unspecified: Secondary | ICD-10-CM | POA: Insufficient documentation

## 2020-03-01 DIAGNOSIS — W208XXA Other cause of strike by thrown, projected or falling object, initial encounter: Secondary | ICD-10-CM | POA: Insufficient documentation

## 2020-03-01 DIAGNOSIS — Y92009 Unspecified place in unspecified non-institutional (private) residence as the place of occurrence of the external cause: Secondary | ICD-10-CM | POA: Insufficient documentation

## 2020-03-01 DIAGNOSIS — Y999 Unspecified external cause status: Secondary | ICD-10-CM | POA: Insufficient documentation

## 2020-03-02 ENCOUNTER — Encounter (HOSPITAL_BASED_OUTPATIENT_CLINIC_OR_DEPARTMENT_OTHER): Payer: Self-pay | Admitting: Emergency Medicine

## 2020-03-02 ENCOUNTER — Emergency Department (HOSPITAL_BASED_OUTPATIENT_CLINIC_OR_DEPARTMENT_OTHER)
Admission: EM | Admit: 2020-03-02 | Discharge: 2020-03-02 | Disposition: A | Discharge status: Home / Self Care | Payer: Federal, State, Local not specified - PPO | Attending: Emergency Medicine | Admitting: Emergency Medicine

## 2020-03-02 DIAGNOSIS — R58 Hemorrhage, not elsewhere classified: Secondary | ICD-10-CM

## 2020-03-02 DIAGNOSIS — R234 Changes in skin texture: Secondary | ICD-10-CM

## 2020-03-02 LAB — BASIC METABOLIC PANEL
Anion gap: 12 (ref 5–15)
BUN: 20 mg/dL (ref 6–20)
CO2: 26 mmol/L (ref 22–32)
Calcium: 9.5 mg/dL (ref 8.9–10.3)
Chloride: 104 mmol/L (ref 98–111)
Creatinine, Ser: 0.93 mg/dL (ref 0.61–1.24)
GFR calc Af Amer: >60 mL/min (ref >60)
GFR calc non Af Amer: >60 mL/min (ref >60)
Glucose, Bld: 161 mg/dL — ABNORMAL HIGH (ref 70–99)
Potassium: 4 mmol/L (ref 3.5–5.1)
Sodium: 142 mmol/L (ref 135–145)

## 2020-03-02 LAB — CBC WITH DIFFERENTIAL/PLATELET
Abs Immature Granulocytes: 0.02 10*3/uL (ref 0.00–0.07)
Basophils Absolute: 0 10*3/uL (ref 0.0–0.1)
Basophils Relative: 1 %
Eosinophils Absolute: 0.2 10*3/uL (ref 0.0–0.5)
Eosinophils Relative: 3 %
HCT: 38.8 % — ABNORMAL LOW (ref 39.0–52.0)
Hemoglobin: 12.3 g/dL — ABNORMAL LOW (ref 13.0–17.0)
Immature Granulocytes: 0 %
Lymphocytes Relative: 25 %
Lymphs Abs: 1.8 10*3/uL (ref 0.7–4.0)
MCH: 28.3 pg (ref 26.0–34.0)
MCHC: 31.7 g/dL (ref 30.0–36.0)
MCV: 89.2 fL (ref 80.0–100.0)
Monocytes Absolute: 0.6 10*3/uL (ref 0.1–1.0)
Monocytes Relative: 8 %
Neutro Abs: 4.6 10*3/uL (ref 1.7–7.7)
Neutrophils Relative %: 63 %
Platelets: 240 10*3/uL (ref 150–400)
RBC: 4.35 MIL/uL (ref 4.22–5.81)
RDW: 13.9 % (ref 11.5–15.5)
WBC: 7.3 10*3/uL (ref 4.0–10.5)
nRBC: 0 % (ref 0.0–0.2)

## 2020-03-02 MED ORDER — ACETAMINOPHEN 500 MG PO TABS
1000.0000 mg | ORAL_TABLET | Freq: Once | ORAL | Status: AC
  Administered 2020-03-02: 1000 mg via ORAL
  Filled 2020-03-02: qty 2

## 2020-03-02 MED ORDER — BACITRACIN ZINC 500 UNIT/GM EX OINT
TOPICAL_OINTMENT | Freq: Two times a day (BID) | CUTANEOUS | Status: DC
  Administered 2020-03-02: 1 via TOPICAL
  Filled 2020-03-02: qty 28.35

## 2020-03-02 NOTE — ED Provider Notes (Signed)
[facility] EMERGENCY DEPARTMENT Provider Note   CSN: [id number] Arrival date & time: 03/01/20  2333     History Chief Complaint  Patient presents with  . Leg Pain  . Leg Injury    Alexander Calderon is a 59 y.o. male.  The history is provided by the patient.  Leg Pain Lower extremity pain location: right shin  Time since incident:  1 week Injury: yes (banged it on a piece of metal )   Mechanism of injury comment:  Bumped it  Pain details:    Quality:  Aching   Radiates to:  Does not radiate   Severity:  Moderate   Onset quality:  Sudden   Duration:  1 week   Timing:  Constant   Progression:  Unchanged Chronicity:  New Dislocation: no   Foreign body present:  No foreign bodies Tetanus status:  Up to date Prior injury to area:  No Relieved by:  Nothing Worsened by:  Nothing Ineffective treatments:  None tried Associated symptoms: no fever   Associated symptoms comment:  Bruised and has a scab wanted it checked out  Risk factors: no concern for non-accidental trauma        Past Medical History:  Diagnosis Date  . Asthma   . Coronary artery disease    a. history of BMS to RCA in 2000 and again in 2002. b. PTCA of the distal LCx in 2011. c. [place] 11/2013: s/p DES to distal RCA, normal LV function (done from L radial approach as pt has history of  difficulty engaging the LCA and significant radial spasm from R radial approach).  . Depression   . GERD (gastroesophageal reflux disease)   . Hyperlipidemia   . Hypertension   . Type 2 diabetes mellitus [facility])     Patient Active Problem List   Diagnosis Date Noted  . Hypoxia 01/18/2020  . Acute respiratory failure with hypoxia (HCC) 01/17/2020  . Unstable angina pectoris (HCC) 11/16/2013  . Type 2 diabetes mellitus (HCC)   . Depression   . GERD (gastroesophageal reflux disease)   . Hyperlipidemia   . Hypertension   . CAD (coronary artery disease), native coronary artery   . Asthma     Past Surgical  History:  Procedure Laterality Date  . APPENDECTOMY    . LEFT HEART CATHETERIZATION WITH CORONARY ANGIOGRAM N/A 11/17/2013   Procedure: LEFT HEART CATHETERIZATION WITH CORONARY ANGIOGRAM;  Surgeon: Peter M Swaziland, MD;  Location: [facility] CATH LAB;  Service: Cardiovascular;  Laterality: N/A;       Family History  Problem Relation Age of Onset  . Heart attack Father        age 71  . Coronary artery disease Brother        onset  CAD age 7    Social History   Tobacco Use  . Smoking status: Never Smoker  . Smokeless tobacco: Never Used  Substance Use Topics  . Alcohol use: Yes    Alcohol/week: 1.0 standard drink    Types: 1 drink(s) per week  . Drug use: No    Home Medications Prior to Admission medications   Medication Sig Start Date End Date Taking? Authorizing Provider  albuterol (PROVENTIL HFA;VENTOLIN HFA) 108 (90 BASE) MCG/ACT inhaler Inhale 2 puffs into the lungs every 6 (six) hours as needed for wheezing.    [provider]  Alum Hydroxide-Mag Trisilicate (GAVISCON) 80-14.2 MG CHEW Chew 1 tablet by mouth daily.  03/28/19 03/27/20  [provider]  aspirin  81 MG tablet Take 81 mg by mouth daily.      [provider]  Azelastine HCl 0.15 % SOLN Place 1 spray into the nose as needed (allergies).  06/25/19   [provider]  buPROPion (WELLBUTRIN XL) 150 MG 24 hr tablet Take 150 mg by mouth every morning. Take 3 tablets every morning 06/25/19   [provider]  chlorpheniramine-HYDROcodone (TUSSIONEX) 10-8 MG/5ML SUER Take 5 mLs by mouth every 12 (twelve) hours as needed for cough. 01/20/20   Alwyn Ren, MD  citalopram (CELEXA) 40 MG tablet Take 40 mg by mouth daily.    [provider]  clopidogrel (PLAVIX) 75 MG tablet TAKE ONE (1) TABLET BY MOUTH EVERY DAY 09/10/19   Kathleene Hazel, MD  dexamethasone (DECADRON) 6 MG tablet Take 1 tablet (6 mg total) by mouth daily. 01/20/20   Alwyn Ren, MD  ezetimibe  (ZETIA) 10 MG tablet Take 10 mg by mouth daily.      [provider]  famotidine (PEPCID) 20 MG tablet Take 2 tablets by mouth as directed. 07/28/19   [provider]  ferrous sulfate 325 (65 FE) MG tablet Take 325 mg by mouth 2 (two) times daily.    [provider]  fluticasone (FLONASE) 50 MCG/ACT nasal spray Place 1 spray into both nostrils as needed for allergies.  06/25/19   [provider]  guaiFENesin-dextromethorphan (ROBITUSSIN DM) 100-10 MG/5ML syrup Take 10 mLs by mouth every 4 (four) hours as needed for cough. 01/20/20   Alwyn Ren, MD  HYDROcodone-acetaminophen (NORCO/VICODIN) 5-325 MG tablet Take 1 tablet by mouth every 4 (four) hours as needed for moderate pain (cough).  01/13/20   [provider]  hyoscyamine (LEVBID) 0.375 MG 12 hr tablet Take 0.375 mg by mouth 2 (two) times daily. 01/09/19   [provider]  LORazepam (ATIVAN) 1 MG tablet Take 1 mg by mouth every 8 (eight) hours as needed for anxiety.    [provider]  metFORMIN (GLUCOPHAGE-XR) 500 MG 24 hr tablet Take 2 tablets by mouth daily.  01/11/16   [provider]  metoprolol (TOPROL-XL) 50 MG 24 hr tablet Take 1 tablet by mouth daily.  02/21/11   [provider]  mirtazapine (REMERON) 45 MG tablet Take 1 tab at night 11/08/13   [provider]  nitroGLYCERIN (NITROSTAT) 0.4 MG SL tablet DISSOLVE 1 TABLET UNDER TONGUE EVERY 5 MINUTES FOR UP TO 3 DOSES AS NEEDED FOR CHEST PAIN 03/10/19   Kathleene Hazel, MD  omeprazole (PRILOSEC) 40 MG capsule Take 40 mg by mouth daily. 01/11/16   [provider]  rosuvastatin (CRESTOR) 40 MG tablet Take 40 mg by mouth daily.      [provider]  sildenafil (REVATIO) 20 MG tablet Take 40-100 mg by mouth as needed (erectile dysfunction).  05/16/17   [provider]  SINGULAIR 10 MG tablet Take 10 mg by mouth daily.  02/14/11   [provider]  sitaGLIPtin  (JANUVIA) 100 MG tablet Take 100 mg by mouth daily. 06/25/19   [provider]  sucralfate (CARAFATE) 1 GM/10ML suspension Take 10 mLs (1 g total) by mouth 4 (four) times daily -  with meals and at bedtime. 01/20/20   Alwyn Ren, MD    Allergies    Dextrans  Review of Systems   Review of Systems  Constitutional: Negative for fever.  HENT: Negative for congestion.   Eyes: Negative for visual disturbance.  Respiratory: Negative  for shortness of breath.   Cardiovascular: Negative for chest pain and leg swelling.  Genitourinary: Negative for difficulty urinating.  Musculoskeletal: Negative for arthralgias.  Skin:       Bruising and scab   Neurological: Negative for dizziness.  Psychiatric/Behavioral: Negative for agitation.  All other systems reviewed and are negative.   Physical Exam Updated Vital Signs BP 132/74 (BP Location: Left Arm)   Pulse 79   Temp 97.7 F (36.5 C) (Oral)   Resp 18   Ht 5\' 5"  (1.651 m)   Wt 64.4 kg   SpO2 99%   BMI 23.63 kg/m   Physical Exam Vitals and nursing note reviewed.  Constitutional:      General: He is not in acute distress.    Appearance: Normal appearance.  HENT:     Head: Normocephalic and atraumatic.     Nose: Nose normal.  Eyes:     Conjunctiva/sclera: Conjunctivae normal.     Pupils: Pupils are equal, round, and reactive to light.  Cardiovascular:     Rate and Rhythm: Normal rate and regular rhythm.     Pulses: Normal pulses.     Heart sounds: Normal heart sounds.  Pulmonary:     Effort: Pulmonary effort is normal.     Breath sounds: Normal breath sounds.  Abdominal:     General: Abdomen is flat. Bowel sounds are normal.     Tenderness: There is no abdominal tenderness. There is no guarding.  Musculoskeletal:     Cervical back: Normal range of motion and neck supple.  Skin:    General: Skin is warm and dry.     Capillary Refill: Capillary refill takes less than 2 seconds.       Neurological:      General: No focal deficit present.     Mental Status: He is alert and oriented to person, place, and time.     Deep Tendon Reflexes: Reflexes normal.  Psychiatric:        Mood and Affect: Mood normal.        Behavior: Behavior normal.     ED Results / Procedures / Treatments   Labs (all labs ordered are listed, but only abnormal results are displayed) Labs Reviewed  CBC WITH DIFFERENTIAL/PLATELET - Abnormal; Notable for the following components:      Result Value   Hemoglobin 12.3 (*)    HCT 38.8 (*)    All other components within normal limits  BASIC METABOLIC PANEL - Abnormal; Notable for the following components:   Glucose, Bld 161 (*)    All other components within normal limits    EKG None  Radiology No results found.  Procedures Procedures (including critical care time)  Medications Ordered in ED Medications  bacitracin ointment (1 application Topical Given 03/02/20 0324)  acetaminophen (TYLENOL) tablet 1,000 mg (1,000 mg Oral Given 03/02/20 [id number])    ED Course  I have reviewed the triage vital signs and the nursing notes.  Pertinent labs & imaging results that were available during my care of the patient were reviewed by me and considered in my medical decision making (see chart for details).    It is a scab with bruising.  No signs of infection nor ulceration.  No indication for antibiotics.  Wound care instructions given.  Take tylenol and ibuprofen for the pain.  Follow up with your PMD.    Alexander Calderon was evaluated in Emergency Department on 03/02/2020 for the symptoms described in the history of present illness. He  was evaluated in the context of the global COVID-19 pandemic, which necessitated consideration that the patient might be at risk for infection with the SARS-CoV-2 virus that causes COVID-19. Institutional protocols and algorithms that pertain to the evaluation of patients at risk for COVID-19 are in a state of rapid change based on information  released by regulatory bodies including the CDC and federal and state organizations. These policies and algorithms were followed during the patient's care in the ED.  Final Clinical Impression(s) / ED Diagnoses Return for intractable cough, coughing up blood,fevers >100.4 unrelieved by medication, shortness of breath, intractable vomiting, chest pain, shortness of breath, weakness,numbness, changes in speech, facial asymmetry,abdominal pain, passing out,Inability to tolerate liquids or food, cough, altered mental status or any concerns. No signs of systemic illness or infection. The patient is nontoxic-appearing on exam and vital signs are within normal limits.   I have reviewed the triage vital signs and the nursing notes. Pertinent labs &imaging results that were available during my care of the patient were reviewed by me and considered in my medical decision making (see chart for details).After history, exam, and medical workup I feel the patient has beenappropriately medically screened and is safe for discharge home. Pertinent diagnoses were discussed with the patient. Patient was given return precautions.   Ellwood Steidle, MD 03/02/20 [phone]

## 2020-03-02 NOTE — ED Triage Notes (Signed)
Patient presents with complaints of right lower extremity pain; states a piece of metal fell onto right lower extremity 3-4 days ago; wound scabbed over and redness noted around area. Ambulatory with steady gait.

## 2020-06-13 ENCOUNTER — Other Ambulatory Visit: Payer: Self-pay

## 2020-06-13 ENCOUNTER — Encounter (HOSPITAL_BASED_OUTPATIENT_CLINIC_OR_DEPARTMENT_OTHER): Payer: Self-pay | Admitting: *Deleted

## 2020-06-13 ENCOUNTER — Emergency Department (HOSPITAL_BASED_OUTPATIENT_CLINIC_OR_DEPARTMENT_OTHER)
Admission: EM | Admit: 2020-06-13 | Discharge: 2020-06-14 | Disposition: A | Discharge status: Home / Self Care | Payer: Federal, State, Local not specified - PPO | Attending: Emergency Medicine | Admitting: Emergency Medicine

## 2020-06-13 DIAGNOSIS — E119 Type 2 diabetes mellitus without complications: Secondary | ICD-10-CM | POA: Diagnosis not present

## 2020-06-13 DIAGNOSIS — R1030 Lower abdominal pain, unspecified: Secondary | ICD-10-CM | POA: Diagnosis present

## 2020-06-13 DIAGNOSIS — Z79899 Other long term (current) drug therapy: Secondary | ICD-10-CM | POA: Diagnosis not present

## 2020-06-13 DIAGNOSIS — Z7982 Long term (current) use of aspirin: Secondary | ICD-10-CM | POA: Insufficient documentation

## 2020-06-13 DIAGNOSIS — I10 Essential (primary) hypertension: Secondary | ICD-10-CM | POA: Diagnosis not present

## 2020-06-13 DIAGNOSIS — R109 Unspecified abdominal pain: Secondary | ICD-10-CM

## 2020-06-13 DIAGNOSIS — I25110 Atherosclerotic heart disease of native coronary artery with unstable angina pectoris: Secondary | ICD-10-CM | POA: Insufficient documentation

## 2020-06-13 DIAGNOSIS — K439 Ventral hernia without obstruction or gangrene: Secondary | ICD-10-CM | POA: Insufficient documentation

## 2020-06-13 DIAGNOSIS — J45909 Unspecified asthma, uncomplicated: Secondary | ICD-10-CM | POA: Insufficient documentation

## 2020-06-13 DIAGNOSIS — Z7984 Long term (current) use of oral hypoglycemic drugs: Secondary | ICD-10-CM | POA: Insufficient documentation

## 2020-06-13 LAB — URINALYSIS, ROUTINE W REFLEX MICROSCOPIC
Bilirubin Urine: NEGATIVE
Glucose, UA: NEGATIVE mg/dL
Hgb urine dipstick: NEGATIVE
Ketones, ur: NEGATIVE mg/dL
Leukocytes,Ua: NEGATIVE
Nitrite: NEGATIVE
Protein, ur: NEGATIVE mg/dL
Specific Gravity, Urine: 1.01 (ref 1.005–1.030)
pH: 6 (ref 5.0–8.0)

## 2020-06-13 NOTE — ED Triage Notes (Signed)
Left flank pain x 1 day

## 2020-06-14 LAB — BASIC METABOLIC PANEL
Anion gap: 10 (ref 5–15)
BUN: 25 mg/dL — ABNORMAL HIGH (ref 6–20)
CO2: 28 mmol/L (ref 22–32)
Calcium: 9.3 mg/dL (ref 8.9–10.3)
Chloride: 100 mmol/L (ref 98–111)
Creatinine, Ser: 1 mg/dL (ref 0.61–1.24)
GFR, Estimated: >60 mL/min (ref >60)
Glucose, Bld: 136 mg/dL — ABNORMAL HIGH (ref 70–99)
Potassium: 4.2 mmol/L (ref 3.5–5.1)
Sodium: 138 mmol/L (ref 135–145)

## 2020-06-14 LAB — CBC
HCT: 39.8 % (ref 39.0–52.0)
Hemoglobin: 13.4 g/dL (ref 13.0–17.0)
MCH: 28.2 pg (ref 26.0–34.0)
MCHC: 33.7 g/dL (ref 30.0–36.0)
MCV: 83.8 fL (ref 80.0–100.0)
Platelets: 235 10*3/uL (ref 150–400)
RBC: 4.75 MIL/uL (ref 4.22–5.81)
RDW: 12.9 % (ref 11.5–15.5)
WBC: 6.8 10*3/uL (ref 4.0–10.5)
nRBC: 0 % (ref 0.0–0.2)

## 2020-06-14 MED ORDER — KETOROLAC TROMETHAMINE 15 MG/ML IJ SOLN
15.0000 mg | Freq: Once | INTRAMUSCULAR | Status: AC
  Administered 2020-06-14: 15 mg via INTRAVENOUS
  Filled 2020-06-14: qty 1

## 2020-06-14 MED ORDER — KETOROLAC TROMETHAMINE 15 MG/ML IJ SOLN: 15.0000 mg | Freq: Once | INTRAMUSCULAR | Status: DC

## 2020-06-14 NOTE — Discharge Instructions (Addendum)
You were seen today for back/flank pain.  Your initial work-up is reassuring.  Given reproducible nature of your pain, this may be musculoskeletal.  If you note a rash, it could be shingles.  Take Tylenol or ibuprofen for pain relief.  Apply ice or heat as tolerated.  Follow-up with your primary physician in 1 to 2 days if not improving.

## 2020-06-14 NOTE — ED Provider Notes (Signed)
[facility] EMERGENCY DEPARTMENT Provider Note   CSN: [id number] Arrival date & time: 06/13/20  1826     History Chief Complaint  Patient presents with  . Flank Pain    Alexander Calderon is a 59 y.o. male.  HPI     This a 59 year old male with history of coronary artery disease, hypertension, hyperlipidemia, diabetes who presents with left flank pain.  Patient reports "I feel like somebody punched me in the kidney."  He reports constant left-sided flank pain.  It is sharp.  He rates his pain at 7 out of 10.  He has not taken anything for the pain.  Nothing seems to make the pain better or worse including movements.  Pain does not radiate.  No nausea, vomiting, abdominal pain, hematuria, dysuria, change in bowel.  No history of similar pain.  No history of kidney stones. Past Medical History:  Diagnosis Date  . Asthma   . Coronary artery disease    a. history of BMS to RCA in 2000 and again in 2002. b. PTCA of the distal LCx in 2011. c. [place] 11/2013: s/p DES to distal RCA, normal LV function (done from L radial approach as pt has history of  difficulty engaging the LCA and significant radial spasm from R radial approach).  . Depression   . GERD (gastroesophageal reflux disease)   . Hyperlipidemia   . Hypertension   . Type 2 diabetes mellitus [facility])     Patient Active Problem List   Diagnosis Date Noted  . Hypoxia 01/18/2020  . Acute respiratory failure with hypoxia (HCC) 01/17/2020  . Unstable angina pectoris (HCC) 11/16/2013  . Type 2 diabetes mellitus (HCC)   . Depression   . GERD (gastroesophageal reflux disease)   . Hyperlipidemia   . Hypertension   . CAD (coronary artery disease), native coronary artery   . Asthma     Past Surgical History:  Procedure Laterality Date  . APPENDECTOMY    . CORONARY ANGIOPLASTY WITH STENT PLACEMENT    . LEFT HEART CATHETERIZATION WITH CORONARY ANGIOGRAM N/A 11/17/2013   Procedure: LEFT HEART CATHETERIZATION WITH CORONARY  ANGIOGRAM;  Surgeon: Peter M Swaziland, MD;  Location: [facility] CATH LAB;  Service: Cardiovascular;  Laterality: N/A;       Family History  Problem Relation Age of Onset  . Heart attack Father        age 66  . Coronary artery disease Brother        onset  CAD age 30    Social History   Tobacco Use  . Smoking status: Never Smoker  . Smokeless tobacco: Never Used  Vaping Use  . Vaping Use: Never used  Substance Use Topics  . Alcohol use: Yes    Alcohol/week: 1.0 standard drink    Types: 1 drink(s) per week  . Drug use: No    Home Medications Prior to Admission medications   Medication Sig Start Date End Date Taking? Authorizing Provider  albuterol (PROVENTIL HFA;VENTOLIN HFA) 108 (90 BASE) MCG/ACT inhaler Inhale 2 puffs into the lungs every 6 (six) hours as needed for wheezing.    [provider]  aspirin 81 MG tablet Take 81 mg by mouth daily.      [provider]  Azelastine HCl 0.15 % SOLN Place 1 spray into the nose as needed (allergies).  06/25/19   [provider]  buPROPion (WELLBUTRIN XL) 150 MG 24 hr tablet Take 150 mg by mouth every morning. Take 3 tablets every morning  06/25/19   [provider]  chlorpheniramine-HYDROcodone (TUSSIONEX) 10-8 MG/5ML SUER Take 5 mLs by mouth every 12 (twelve) hours as needed for cough. 01/20/20   Alwyn Ren, MD  citalopram (CELEXA) 40 MG tablet Take 40 mg by mouth daily.    [provider]  clopidogrel (PLAVIX) 75 MG tablet TAKE ONE (1) TABLET BY MOUTH EVERY DAY 09/10/19   Kathleene Hazel, MD  dexamethasone (DECADRON) 6 MG tablet Take 1 tablet (6 mg total) by mouth daily. 01/20/20   Alwyn Ren, MD  ezetimibe (ZETIA) 10 MG tablet Take 10 mg by mouth daily.      [provider]  famotidine (PEPCID) 20 MG tablet Take 2 tablets by mouth as directed. 07/28/19   [provider]  ferrous sulfate 325 (65 FE) MG tablet Take 325 mg by mouth 2 (two) times daily.     [provider]  fluticasone (FLONASE) 50 MCG/ACT nasal spray Place 1 spray into both nostrils as needed for allergies.  06/25/19   [provider]  guaiFENesin-dextromethorphan (ROBITUSSIN DM) 100-10 MG/5ML syrup Take 10 mLs by mouth every 4 (four) hours as needed for cough. 01/20/20   Alwyn Ren, MD  HYDROcodone-acetaminophen (NORCO/VICODIN) 5-325 MG tablet Take 1 tablet by mouth every 4 (four) hours as needed for moderate pain (cough).  01/13/20   [provider]  hyoscyamine (LEVBID) 0.375 MG 12 hr tablet Take 0.375 mg by mouth 2 (two) times daily. 01/09/19   [provider]  LORazepam (ATIVAN) 1 MG tablet Take 1 mg by mouth every 8 (eight) hours as needed for anxiety.    [provider]  metFORMIN (GLUCOPHAGE-XR) 500 MG 24 hr tablet Take 2 tablets by mouth daily.  01/11/16   [provider]  metoprolol (TOPROL-XL) 50 MG 24 hr tablet Take 1 tablet by mouth daily.  02/21/11   [provider]  mirtazapine (REMERON) 45 MG tablet Take 1 tab at night 11/08/13   [provider]  nitroGLYCERIN (NITROSTAT) 0.4 MG SL tablet DISSOLVE 1 TABLET UNDER TONGUE EVERY 5 MINUTES FOR UP TO 3 DOSES AS NEEDED FOR CHEST PAIN 03/10/19   Kathleene Hazel, MD  omeprazole (PRILOSEC) 40 MG capsule Take 40 mg by mouth daily. 01/11/16   [provider]  rosuvastatin (CRESTOR) 40 MG tablet Take 40 mg by mouth daily.      [provider]  sildenafil (REVATIO) 20 MG tablet Take 40-100 mg by mouth as needed (erectile dysfunction).  05/16/17   [provider]  SINGULAIR 10 MG tablet Take 10 mg by mouth daily.  02/14/11   [provider]  sitaGLIPtin (JANUVIA) 100 MG tablet Take 100 mg by mouth daily. 06/25/19   [provider]  sucralfate (CARAFATE) 1 GM/10ML suspension Take 10 mLs (1 g total) by mouth 4 (four) times daily -  with meals and at bedtime. 01/20/20   Alwyn Ren, MD    Allergies      Dextrans  Review of Systems   Review of Systems  Constitutional: Negative for fever.  Respiratory: Negative for shortness of breath.   Cardiovascular: Negative for chest pain.  Gastrointestinal: Negative for abdominal pain, diarrhea, nausea and vomiting.  Genitourinary: Positive for flank pain. Negative for dysuria and hematuria.  Skin: Negative for rash.  All other systems reviewed and are negative.   Physical Exam Updated Vital Signs BP 125/74 (BP Location: Right Arm)   Pulse 70   Temp 98.3 F (36.8 C) (Oral)  Resp 17   Ht 1.651 m (5\' 5" )   Wt 71.2 kg   SpO2 95%   BMI 26.13 kg/m   Physical Exam Vitals and nursing note reviewed.  Constitutional:      Appearance: He is well-developed. He is not ill-appearing.  HENT:     Head: Normocephalic and atraumatic.     Nose: Nose normal.  Eyes:     Pupils: Pupils are equal, round, and reactive to light.  Cardiovascular:     Rate and Rhythm: Normal rate and regular rhythm.     Heart sounds: Normal heart sounds. No murmur heard.   Pulmonary:     Effort: Pulmonary effort is normal. No respiratory distress.     Breath sounds: Normal breath sounds. No wheezing.  Abdominal:     General: Bowel sounds are normal.     Palpations: Abdomen is soft.     Tenderness: There is no abdominal tenderness. There is no right CVA tenderness, left CVA tenderness or rebound.     Comments: Ventral abdominal hernia  Musculoskeletal:     Cervical back: Neck supple.     Right lower leg: No edema.     Left lower leg: No edema.     Comments: Tenderness to palpation left lower lumbar paraspinous muscle region, no midline tenderness to palpation, step-off, deformity  Lymphadenopathy:     Cervical: No cervical adenopathy.  Skin:    General: Skin is warm and dry.     Findings: No rash.  Neurological:     Mental Status: He is alert and oriented to person, place, and time.  Psychiatric:        Mood and Affect: Mood normal.     ED Results /  Procedures / Treatments   Labs (all labs ordered are listed, but only abnormal results are displayed) Labs Reviewed  BASIC METABOLIC PANEL - Abnormal; Notable for the following components:      Result Value   Glucose, Bld 136 (*)    BUN 25 (*)    All other components within normal limits  URINALYSIS, ROUTINE W REFLEX MICROSCOPIC  CBC    EKG None  Radiology No results found.  Procedures Procedures (including critical care time)  Medications Ordered in ED Medications  ketorolac (TORADOL) 15 MG/ML injection 15 mg (15 mg Intravenous Given 06/14/20 0045)    ED Course  I have reviewed the triage vital signs and the nursing notes.  Pertinent labs & imaging results that were available during my care of the patient were reviewed by me and considered in my medical decision making (see chart for details).    MDM Rules/Calculators/A&P                          Patient presents with left flank pain.  He is overall nontoxic-appearing vital signs are reassuring.  He has some reproducible left flank pain which is over the musculature of the lumbar spine.  It is low for kidney pain.  Considerations include but not limited to, kidney stone, musculoskeletal pain, less likely UTI.  No evidence of rash to suggest shingles.  Given that he is very well-appearing and has reproducible pain, will treat supportively and obtain some basic lab work including urinalysis.  Urinalysis without evidence of infection.  Lab work-up without significant metabolic abnormality.  Patient did have some improvement with Toradol.  We discussed supportive measures.  At this time have low suspicion for kidney stone given no evidence of blood  in the urine and reproducible pain on exam.  Will treat supportively.  Follow-up with primary physician in 1 to 2 days if not improving.  After history, exam, and medical workup I feel the patient has been appropriately medically screened and is safe for discharge home. Pertinent  diagnoses were discussed with the patient. Patient was given return precautions.   Final Clinical Impression(s) / ED Diagnoses Final diagnoses:  Flank pain    Rx / DC Orders ED Discharge Orders    None       Joyice Magda, Mayer Maskerourtney F, MD 06/14/20 [phone]

## 2020-09-19 NOTE — Progress Notes (Signed)
Virtual Visit via Video Note   This visit type was conducted due to national recommendations for restrictions regarding the COVID-19 Pandemic (e.g. social distancing) in an effort to limit this patient's exposure and mitigate transmission in our community.  Due to his co-morbid illnesses, this patient is at least at moderate risk for complications without adequate follow up.  This format is felt to be most appropriate for this patient at this time.  All issues noted in this document were discussed and addressed.  A limited physical exam was performed with this format.  Please refer to the patient's chart for his consent to telehealth for [facility].  Video Connection Lost Video connection was lost at > 50% of the duration of this visit, at which time the remainder of the visit was completed via audio only.     Date:  09/20/2020   ID:  Alexander Calderon, DOB 01-28-1961, MRN [id number]  Patient Location: Home Provider Location: Home Office  PCP:  Loyal Jacobson, MD  Cardiologist:  Verne Carrow, MD  Electrophysiologist:  None   Evaluation Performed:  Follow-Up Visit  Chief Complaint:  Follow up- CAD  History of Present Illness:    60 yo male with history of CAD, HTN and asthma here today for cardiac follow up. His CAD dates back to 2000 when he had a bare metal stent placed in the RCA. Cardiac cath in 2002 with another bare metal stent placed in the RCA. He was admitted with unstable angina 11/16/13 at [facility] and cardiac cath 11/17/13 showed severe distal RCA stenosis which was treated with a Promus drug eluting stent. Echo in 2014 with LVEF=65-70% with no valve disease.   The patient denies chest pain, dyspnea, palpitations, dizziness, near syncope or syncope. No lower extremity edema. He reports ED.   The patient does not have symptoms concerning for COVID-19 infection (fever, chills, cough, or new shortness of breath).    Past Medical History:  Diagnosis Date  . Asthma   .  Coronary artery disease    a. history of BMS to RCA in 2000 and again in 2002. b. PTCA of the distal LCx in 2011. c. [place] 11/2013: s/p DES to distal RCA, normal LV function (done from L radial approach as pt has history of  difficulty engaging the LCA and significant radial spasm from R radial approach).  . Depression   . GERD (gastroesophageal reflux disease)   . Hyperlipidemia   . Hypertension   . Type 2 diabetes mellitus (HCC)    Past Surgical History:  Procedure Laterality Date  . APPENDECTOMY    . CORONARY ANGIOPLASTY WITH STENT PLACEMENT    . LEFT HEART CATHETERIZATION WITH CORONARY ANGIOGRAM N/A 11/17/2013   Procedure: LEFT HEART CATHETERIZATION WITH CORONARY ANGIOGRAM;  Surgeon: Peter M Swaziland, MD;  Location: [facility] CATH LAB;  Service: Cardiovascular;  Laterality: N/A;     Current Meds  Medication Sig  . albuterol (PROVENTIL HFA;VENTOLIN HFA) 108 (90 BASE) MCG/ACT inhaler Inhale 2 puffs into the lungs every 6 (six) hours as needed for wheezing.  Marland Kitchen aspirin 81 MG tablet Take 81 mg by mouth daily.  . Azelastine HCl 0.15 % SOLN Place 1 spray into the nose as needed (allergies).   Marland Kitchen buPROPion (WELLBUTRIN XL) 150 MG 24 hr tablet Take 150 mg by mouth every morning. Take 3 tablets every morning  . chlorpheniramine-HYDROcodone (TUSSIONEX) 10-8 MG/5ML SUER Take 5 mLs by mouth every 12 (twelve) hours as needed for cough.  . citalopram (  CELEXA) 40 MG tablet Take 40 mg by mouth daily.  . clopidogrel (PLAVIX) 75 MG tablet TAKE ONE (1) TABLET BY MOUTH EVERY DAY  . ezetimibe (ZETIA) 10 MG tablet Take 10 mg by mouth daily.  . famotidine (PEPCID) 20 MG tablet Take 2 tablets by mouth as directed.  . ferrous sulfate 325 (65 FE) MG tablet Take 325 mg by mouth 2 (two) times daily.  . fluticasone (FLONASE) 50 MCG/ACT nasal spray Place 1 spray into both nostrils as needed for allergies.   Marland Kitchen HYDROcodone-acetaminophen (NORCO/VICODIN) 5-325 MG tablet Take 1 tablet by mouth every 4 (four) hours as needed for  moderate pain (cough).   . hyoscyamine (LEVBID) 0.375 MG 12 hr tablet Take 0.375 mg by mouth 2 (two) times daily.  Marland Kitchen LORazepam (ATIVAN) 1 MG tablet Take 1 mg by mouth every 8 (eight) hours as needed for anxiety.  . metFORMIN (GLUCOPHAGE-XR) 500 MG 24 hr tablet Take 2 tablets by mouth daily.   . metoprolol (TOPROL-XL) 50 MG 24 hr tablet Take 1 tablet by mouth daily.   . mirtazapine (REMERON) 45 MG tablet Take 1 tab at night  . nitroGLYCERIN (NITROSTAT) 0.4 MG SL tablet DISSOLVE 1 TABLET UNDER TONGUE EVERY 5 MINUTES FOR UP TO 3 DOSES AS NEEDED FOR CHEST PAIN  . omeprazole (PRILOSEC) 40 MG capsule Take 40 mg by mouth daily.  . rosuvastatin (CRESTOR) 40 MG tablet Take 40 mg by mouth daily.  . sildenafil (REVATIO) 20 MG tablet Take 40-100 mg by mouth as needed (erectile dysfunction).   . SINGULAIR 10 MG tablet Take 10 mg by mouth daily.   . sitaGLIPtin (JANUVIA) 100 MG tablet Take 100 mg by mouth daily.     Allergies:   Dextrans   Social History   Tobacco Use  . Smoking status: Never Smoker  . Smokeless tobacco: Never Used  Vaping Use  . Vaping Use: Never used  Substance Use Topics  . Alcohol use: Yes    Alcohol/week: 1.0 standard drink    Types: 1 drink(s) per week  . Drug use: No     Family Hx: The patient's family history includes Coronary artery disease in his brother; Heart attack in his father.  ROS:   Please see the history of present illness.    All other systems reviewed and are negative.   Prior CV studies:   The following studies were reviewed today:  Echocardiogram: 06/2013 Left ventricle: The cavity size was normal. Wall thickness  was increased in a pattern of mild LVH. Systolic function  was vigorous. The estimated ejection fraction was in the  range of 65% to 70%. Wall motion was normal; there were no  regional wall motion abnormalities.  - Atrial septum: No defect or patent foramen ovale was  identified.   Labs/Other Tests and Data Reviewed:     EKG:  No ECG reviewed.  Recent Labs: 01/17/2020: ALT 93; B Natriuretic Peptide 17.8 06/14/2020: BUN 25; Creatinine, Ser 1.00; Hemoglobin 13.4; Platelets 235; Potassium 4.2; Sodium 138   Recent Lipid Panel Lab Results  Component Value Date/Time   CHOL 141 11/17/2013 02:55 AM   TRIG 289 (H) 11/17/2013 02:55 AM   HDL 43 11/17/2013 02:55 AM   CHOLHDL 3.3 11/17/2013 02:55 AM   LDLCALC 40 11/17/2013 02:55 AM    Wt Readings from Last 3 Encounters:  09/20/20 152 lb (68.9 kg)  06/13/20 157 lb (71.2 kg)  03/02/20 141 lb 15.6 oz (64.4 kg)     Objective:  Vital Signs:  Ht 5\' 5"  (1.651 m)   Wt 152 lb (68.9 kg)   BMI 25.29 kg/m    VITAL SIGNS:  reviewed  ASSESSMENT & PLAN:    1. CAD without angina: He has no chest pain. Continue ASA, Plavix, statin. He will hold Toprol for 2-3 weeks to see if this helps his ED.    2. HTN: BP is controlled.  3. Hyperlipidemia: Lipids followed in primary care. Continue statin.   COVID-19 Education: The signs and symptoms of COVID-19 were discussed with the patient and how to seek care for testing (follow up with PCP or arrange E-visit).  The importance of social distancing was discussed today.  Time:   Today, I have spent 15 minutes with the patient with telehealth technology discussing the above problems.     Medication Adjustments/Labs and Tests Ordered: Current medicines are reviewed at length with the patient today.  Concerns regarding medicines are outlined above.   Tests Ordered: No orders of the defined types were placed in this encounter.   Medication Changes: No orders of the defined types were placed in this encounter.   Disposition:  Follow up with me in 6 months.   Signed, , MD  09/20/2020 11:09 AM    [facility]

## 2020-09-20 ENCOUNTER — Other Ambulatory Visit: Payer: Self-pay

## 2020-09-20 ENCOUNTER — Encounter: Payer: Self-pay | Admitting: Cardiovascular Disease

## 2020-09-20 ENCOUNTER — Telehealth (INDEPENDENT_AMBULATORY_CARE_PROVIDER_SITE_OTHER): Payer: Federal, State, Local not specified - PPO | Admitting: Cardiovascular Disease

## 2020-09-20 VITALS — Ht 65.0 in | Wt 152.0 lb

## 2020-09-20 DIAGNOSIS — I10 Essential (primary) hypertension: Secondary | ICD-10-CM

## 2020-09-20 DIAGNOSIS — I2510 Atherosclerotic heart disease of native coronary artery without angina pectoris: Secondary | ICD-10-CM

## 2020-09-20 DIAGNOSIS — E785 Hyperlipidemia, unspecified: Secondary | ICD-10-CM | POA: Diagnosis not present

## 2020-09-20 NOTE — Patient Instructions (Addendum)
Medication Instructions:  No change  *If you need a refill on your cardiac medications before your next appointment, please call your pharmacy*   Lab Work: none If you have labs (blood work) drawn today and your tests are completely normal, you will receive your results only by: Marland Kitchen MyChart Message (if you have MyChart) OR . A paper copy in the mail If you have any lab test that is abnormal or we need to change your treatment, we will call you to review the results.   Testing/Procedures: none   Follow-Up: At [facility], you and your health needs are our priority.  As part of our continuing mission to provide you with exceptional heart care, we have created designated Provider Care Teams.  These Care Teams include your primary Cardiologist (physician) and Advanced Practice Providers (APPs -  Physician Assistants and Nurse Practitioners) who all work together to provide you with the care you need, when you need it.  We recommend signing up for the patient portal called "MyChart".  Sign up information is provided on this After Visit Summary.  MyChart is used to connect with patients for Virtual Visits (Telemedicine).  Patients are able to view lab/test results, encounter notes, upcoming appointments, etc.  Non-urgent messages can be sent to your provider as well.   To learn more about what you can do with MyChart, go to [email or web address].    Your next appointment:   6 month(s)  The format for your next appointment:   In Person  Provider:   You may see Verne Carrow, MD or one of the following Advanced Practice Providers on your designated Care Team:    Ronie Spies, PA-C  Jacolyn Reedy, PA-C   Other Instructions

## 2020-10-29 ENCOUNTER — Other Ambulatory Visit: Payer: Self-pay | Admitting: Cardiovascular Disease

## 2020-11-06 ENCOUNTER — Other Ambulatory Visit: Payer: Self-pay

## 2020-11-06 ENCOUNTER — Inpatient Hospital Stay (HOSPITAL_BASED_OUTPATIENT_CLINIC_OR_DEPARTMENT_OTHER)
Admission: EM | Admit: 2020-11-06 | Discharge: 2020-11-15 | DRG: 419 | Disposition: A | Discharge status: Home / Self Care | Payer: Federal, State, Local not specified - PPO | Attending: Internal Medicine | Admitting: Internal Medicine

## 2020-11-06 ENCOUNTER — Encounter (HOSPITAL_BASED_OUTPATIENT_CLINIC_OR_DEPARTMENT_OTHER): Payer: Self-pay

## 2020-11-06 ENCOUNTER — Emergency Department (HOSPITAL_BASED_OUTPATIENT_CLINIC_OR_DEPARTMENT_OTHER): Payer: Federal, State, Local not specified - PPO

## 2020-11-06 DIAGNOSIS — Z8249 Family history of ischemic heart disease and other diseases of the circulatory system: Secondary | ICD-10-CM

## 2020-11-06 DIAGNOSIS — R52 Pain, unspecified: Secondary | ICD-10-CM

## 2020-11-06 DIAGNOSIS — K219 Gastro-esophageal reflux disease without esophagitis: Secondary | ICD-10-CM | POA: Diagnosis present

## 2020-11-06 DIAGNOSIS — K812 Acute cholecystitis with chronic cholecystitis: Principal | ICD-10-CM | POA: Diagnosis present

## 2020-11-06 DIAGNOSIS — Z7982 Long term (current) use of aspirin: Secondary | ICD-10-CM

## 2020-11-06 DIAGNOSIS — E785 Hyperlipidemia, unspecified: Secondary | ICD-10-CM | POA: Diagnosis present

## 2020-11-06 DIAGNOSIS — R079 Chest pain, unspecified: Secondary | ICD-10-CM

## 2020-11-06 DIAGNOSIS — E119 Type 2 diabetes mellitus without complications: Secondary | ICD-10-CM | POA: Diagnosis present

## 2020-11-06 DIAGNOSIS — Z7902 Long term (current) use of antithrombotics/antiplatelets: Secondary | ICD-10-CM | POA: Diagnosis not present

## 2020-11-06 DIAGNOSIS — R109 Unspecified abdominal pain: Secondary | ICD-10-CM

## 2020-11-06 DIAGNOSIS — Z419 Encounter for procedure for purposes other than remedying health state, unspecified: Secondary | ICD-10-CM

## 2020-11-06 DIAGNOSIS — K819 Cholecystitis, unspecified: Secondary | ICD-10-CM | POA: Diagnosis not present

## 2020-11-06 DIAGNOSIS — Z20822 Contact with and (suspected) exposure to covid-19: Secondary | ICD-10-CM | POA: Diagnosis present

## 2020-11-06 DIAGNOSIS — Z955 Presence of coronary angioplasty implant and graft: Secondary | ICD-10-CM

## 2020-11-06 DIAGNOSIS — I10 Essential (primary) hypertension: Secondary | ICD-10-CM | POA: Diagnosis present

## 2020-11-06 DIAGNOSIS — J45909 Unspecified asthma, uncomplicated: Secondary | ICD-10-CM | POA: Diagnosis present

## 2020-11-06 DIAGNOSIS — Z888 Allergy status to other drugs, medicaments and biological substances status: Secondary | ICD-10-CM

## 2020-11-06 DIAGNOSIS — I2510 Atherosclerotic heart disease of native coronary artery without angina pectoris: Secondary | ICD-10-CM | POA: Diagnosis present

## 2020-11-06 DIAGNOSIS — F32A Depression, unspecified: Secondary | ICD-10-CM | POA: Diagnosis present

## 2020-11-06 DIAGNOSIS — R7989 Other specified abnormal findings of blood chemistry: Secondary | ICD-10-CM | POA: Diagnosis present

## 2020-11-06 DIAGNOSIS — Z79899 Other long term (current) drug therapy: Secondary | ICD-10-CM

## 2020-11-06 DIAGNOSIS — R1011 Right upper quadrant pain: Secondary | ICD-10-CM | POA: Diagnosis not present

## 2020-11-06 DIAGNOSIS — Z7984 Long term (current) use of oral hypoglycemic drugs: Secondary | ICD-10-CM | POA: Diagnosis not present

## 2020-11-06 DIAGNOSIS — K5900 Constipation, unspecified: Secondary | ICD-10-CM | POA: Diagnosis present

## 2020-11-06 LAB — CBC WITH DIFFERENTIAL/PLATELET
Abs Immature Granulocytes: 0.02 10*3/uL (ref 0.00–0.07)
Basophils Absolute: 0 10*3/uL (ref 0.0–0.1)
Basophils Relative: 0 %
Eosinophils Absolute: 0.1 10*3/uL (ref 0.0–0.5)
Eosinophils Relative: 1 %
HCT: 38 % — ABNORMAL LOW (ref 39.0–52.0)
Hemoglobin: 12.6 g/dL — ABNORMAL LOW (ref 13.0–17.0)
Immature Granulocytes: 0 %
Lymphocytes Relative: 11 %
Lymphs Abs: 1.2 10*3/uL (ref 0.7–4.0)
MCH: 28.4 pg (ref 26.0–34.0)
MCHC: 33.2 g/dL (ref 30.0–36.0)
MCV: 85.6 fL (ref 80.0–100.0)
Monocytes Absolute: 0.8 10*3/uL (ref 0.1–1.0)
Monocytes Relative: 8 %
Neutro Abs: 8 10*3/uL — ABNORMAL HIGH (ref 1.7–7.7)
Neutrophils Relative %: 80 %
Platelets: 223 10*3/uL (ref 150–400)
RBC: 4.44 MIL/uL (ref 4.22–5.81)
RDW: 12.5 % (ref 11.5–15.5)
WBC: 10.1 10*3/uL (ref 4.0–10.5)
nRBC: 0 % (ref 0.0–0.2)

## 2020-11-06 LAB — GLUCOSE, CAPILLARY
Glucose-Capillary: 186 mg/dL — ABNORMAL HIGH (ref 70–99)
Glucose-Capillary: 176 mg/dL — ABNORMAL HIGH (ref 70–99)
Glucose-Capillary: 140 mg/dL — ABNORMAL HIGH (ref 70–99)

## 2020-11-06 LAB — CBC
HCT: 37.5 % — ABNORMAL LOW (ref 39.0–52.0)
Hemoglobin: 12.2 g/dL — ABNORMAL LOW (ref 13.0–17.0)
MCH: 28.1 pg (ref 26.0–34.0)
MCHC: 32.5 g/dL (ref 30.0–36.0)
MCV: 86.4 fL (ref 80.0–100.0)
Platelets: 164 10*3/uL (ref 150–400)
RBC: 4.34 MIL/uL (ref 4.22–5.81)
RDW: 12.6 % (ref 11.5–15.5)
WBC: 6.5 10*3/uL (ref 4.0–10.5)
nRBC: 0 % (ref 0.0–0.2)

## 2020-11-06 LAB — RESP PANEL BY RT-PCR (FLU A&B, COVID) ARPGX2
Influenza A by PCR: NEGATIVE
Influenza B by PCR: NEGATIVE
SARS Coronavirus 2 by RT PCR: NEGATIVE

## 2020-11-06 LAB — COMPREHENSIVE METABOLIC PANEL
ALT: 49 U/L — ABNORMAL HIGH (ref 0–44)
AST: 36 U/L (ref 15–41)
Albumin: 4.1 g/dL (ref 3.5–5.0)
Alkaline Phosphatase: 65 U/L (ref 38–126)
Anion gap: 10 (ref 5–15)
BUN: 18 mg/dL (ref 6–20)
CO2: 28 mmol/L (ref 22–32)
Calcium: 8.8 mg/dL — ABNORMAL LOW (ref 8.9–10.3)
Chloride: 101 mmol/L (ref 98–111)
Creatinine, Ser: 0.85 mg/dL (ref 0.61–1.24)
GFR, Estimated: >60 mL/min (ref >60)
Glucose, Bld: 132 mg/dL — ABNORMAL HIGH (ref 70–99)
Potassium: 3.9 mmol/L (ref 3.5–5.1)
Sodium: 139 mmol/L (ref 135–145)
Total Bilirubin: 0.3 mg/dL (ref 0.3–1.2)
Total Protein: 6.7 g/dL (ref 6.5–8.1)

## 2020-11-06 LAB — TROPONIN I (HIGH SENSITIVITY)
Troponin I (High Sensitivity): 2 ng/L (ref <18)
Troponin I (High Sensitivity): <2 ng/L (ref <18)

## 2020-11-06 LAB — HEMOGLOBIN A1C
Hgb A1c MFr Bld: 6.9 % — ABNORMAL HIGH (ref 4.8–5.6)
Mean Plasma Glucose: 151.33 mg/dL

## 2020-11-06 LAB — CREATININE, SERUM
Creatinine, Ser: 0.75 mg/dL (ref 0.61–1.24)
GFR, Estimated: >60 mL/min (ref >60)

## 2020-11-06 MED ORDER — LORAZEPAM 1 MG PO TABS
1.0000 mg | ORAL_TABLET | Freq: Three times a day (TID) | ORAL | Status: DC | PRN
  Administered 2020-11-12: 1 mg via ORAL
  Filled 2020-11-06: qty 1

## 2020-11-06 MED ORDER — ALUM & MAG HYDROXIDE-SIMETH 200-200-20 MG/5ML PO SUSP
30.0000 mL | Freq: Four times a day (QID) | ORAL | Status: DC | PRN
  Administered 2020-11-12: 30 mL via ORAL
  Filled 2020-11-06: qty 30

## 2020-11-06 MED ORDER — EZETIMIBE 10 MG PO TABS
10.0000 mg | ORAL_TABLET | Freq: Every day | ORAL | Status: DC
  Administered 2020-11-06: 10 mg via ORAL
  Filled 2020-11-06: qty 1

## 2020-11-06 MED ORDER — LIDOCAINE VISCOUS HCL 2 % MT SOLN
15.0000 mL | Freq: Once | OROMUCOSAL | Status: AC
  Administered 2020-11-06: 15 mL via ORAL
  Filled 2020-11-06: qty 15

## 2020-11-06 MED ORDER — SODIUM CHLORIDE 0.9 % IV SOLN
2.0000 g | INTRAVENOUS | Status: DC
  Administered 2020-11-07 – 2020-11-11 (×6): 2 g via INTRAVENOUS
  Filled 2020-11-06 (×5): qty 2

## 2020-11-06 MED ORDER — ALBUTEROL SULFATE HFA 108 (90 BASE) MCG/ACT IN AERS: 2.0000 | INHALATION_SPRAY | Freq: Four times a day (QID) | RESPIRATORY_TRACT | Status: DC | PRN

## 2020-11-06 MED ORDER — FENTANYL CITRATE (PF) 100 MCG/2ML IJ SOLN
50.0000 ug | Freq: Once | INTRAMUSCULAR | Status: AC
Start: 2020-11-06 — End: 2020-11-06
  Administered 2020-11-06: 50 ug via INTRAVENOUS
  Filled 2020-11-06: qty 2

## 2020-11-06 MED ORDER — BUPROPION HCL ER (XL) 300 MG PO TB24
450.0000 mg | ORAL_TABLET | Freq: Every morning | ORAL | Status: DC
  Administered 2020-11-07 – 2020-11-15 (×9): 450 mg via ORAL
  Filled 2020-11-06 (×9): qty 1

## 2020-11-06 MED ORDER — DOCUSATE SODIUM 100 MG PO CAPS
100.0000 mg | ORAL_CAPSULE | Freq: Two times a day (BID) | ORAL | Status: DC
  Administered 2020-11-06 – 2020-11-13 (×14): 100 mg via ORAL
  Filled 2020-11-06 (×15): qty 1

## 2020-11-06 MED ORDER — ROSUVASTATIN CALCIUM 20 MG PO TABS
40.0000 mg | ORAL_TABLET | Freq: Every day | ORAL | Status: DC
  Administered 2020-11-06: 40 mg via ORAL
  Filled 2020-11-06: qty 2

## 2020-11-06 MED ORDER — INSULIN ASPART 100 UNIT/ML ~~LOC~~ SOLN
0.0000 [IU] | Freq: Three times a day (TID) | SUBCUTANEOUS | Status: DC
  Administered 2020-11-06 – 2020-11-07 (×4): 3 [IU] via SUBCUTANEOUS
  Administered 2020-11-07 – 2020-11-08 (×3): 2 [IU] via SUBCUTANEOUS
  Administered 2020-11-08: 3 [IU] via SUBCUTANEOUS
  Administered 2020-11-09: 2 [IU] via SUBCUTANEOUS
  Administered 2020-11-09: 11 [IU] via SUBCUTANEOUS
  Filled 2020-11-06: qty 0.15

## 2020-11-06 MED ORDER — ASPIRIN 81 MG PO CHEW
81.0000 mg | CHEWABLE_TABLET | Freq: Every day | ORAL | Status: DC
  Administered 2020-11-06 – 2020-11-11 (×6): 81 mg via ORAL
  Filled 2020-11-06 (×6): qty 1

## 2020-11-06 MED ORDER — ALUM & MAG HYDROXIDE-SIMETH 200-200-20 MG/5ML PO SUSP
30.0000 mL | Freq: Once | ORAL | Status: AC
  Administered 2020-11-06: 30 mL via ORAL
  Filled 2020-11-06: qty 30

## 2020-11-06 MED ORDER — ENOXAPARIN SODIUM 40 MG/0.4ML ~~LOC~~ SOLN
40.0000 mg | SUBCUTANEOUS | Status: DC
  Administered 2020-11-06 – 2020-11-14 (×7): 40 mg via SUBCUTANEOUS
  Filled 2020-11-06 (×7): qty 0.4

## 2020-11-06 MED ORDER — FLUTICASONE PROPIONATE 50 MCG/ACT NA SUSP
1.0000 | Freq: Every day | NASAL | Status: DC | PRN
  Filled 2020-11-06: qty 16

## 2020-11-06 MED ORDER — MORPHINE SULFATE (PF) 4 MG/ML IV SOLN
4.0000 mg | Freq: Once | INTRAVENOUS | Status: AC
  Administered 2020-11-06: 4 mg via INTRAVENOUS
  Filled 2020-11-06: qty 1

## 2020-11-06 MED ORDER — MORPHINE SULFATE (PF) 4 MG/ML IV SOLN
4.0000 mg | INTRAVENOUS | Status: DC | PRN
  Administered 2020-11-06: 4 mg via INTRAVENOUS
  Filled 2020-11-06: qty 1

## 2020-11-06 MED ORDER — MORPHINE SULFATE (PF) 2 MG/ML IV SOLN
2.0000 mg | Freq: Once | INTRAVENOUS | Status: AC
Start: 2020-11-06 — End: 2020-11-06
  Administered 2020-11-06: 2 mg via INTRAVENOUS
  Filled 2020-11-06: qty 1

## 2020-11-06 MED ORDER — MONTELUKAST SODIUM 10 MG PO TABS
10.0000 mg | ORAL_TABLET | Freq: Every day | ORAL | Status: DC
Start: 2020-11-06 — End: 2020-11-15
  Administered 2020-11-06 – 2020-11-14 (×9): 10 mg via ORAL
  Filled 2020-11-06 (×9): qty 1

## 2020-11-06 MED ORDER — IOHEXOL 350 MG/ML SOLN
100.0000 mL | Freq: Once | INTRAVENOUS | Status: AC | PRN
  Administered 2020-11-06: 100 mL via INTRAVENOUS

## 2020-11-06 MED ORDER — ONDANSETRON HCL 4 MG PO TABS: 4.0000 mg | ORAL_TABLET | Freq: Four times a day (QID) | ORAL | Status: DC | PRN

## 2020-11-06 MED ORDER — AZELASTINE HCL 0.1 % NA SOLN
1.0000 | Freq: Two times a day (BID) | NASAL | Status: DC | PRN
  Filled 2020-11-06: qty 30

## 2020-11-06 MED ORDER — HYOSCYAMINE SULFATE 0.125 MG SL SUBL
0.1250 mg | SUBLINGUAL_TABLET | Freq: Once | SUBLINGUAL | Status: AC
  Administered 2020-11-06: 0.125 mg via SUBLINGUAL
  Filled 2020-11-06: qty 1

## 2020-11-06 MED ORDER — PANTOPRAZOLE SODIUM 40 MG PO TBEC
40.0000 mg | DELAYED_RELEASE_TABLET | Freq: Every day | ORAL | Status: DC
  Administered 2020-11-06 – 2020-11-15 (×10): 40 mg via ORAL
  Filled 2020-11-06 (×10): qty 1

## 2020-11-06 MED ORDER — INSULIN ASPART 100 UNIT/ML ~~LOC~~ SOLN
0.0000 [IU] | Freq: Every day | SUBCUTANEOUS | Status: DC
  Administered 2020-11-08: 3 [IU] via SUBCUTANEOUS
  Administered 2020-11-09: 2 [IU] via SUBCUTANEOUS
  Administered 2020-11-10 – 2020-11-11 (×2): 3 [IU] via SUBCUTANEOUS
  Administered 2020-11-14: 2 [IU] via SUBCUTANEOUS
  Filled 2020-11-06: qty 0.05

## 2020-11-06 MED ORDER — MIRTAZAPINE 15 MG PO TABS
45.0000 mg | ORAL_TABLET | Freq: Every day | ORAL | Status: DC
Start: 2020-11-06 — End: 2020-11-15
  Administered 2020-11-06 – 2020-11-14 (×9): 45 mg via ORAL
  Filled 2020-11-06 (×9): qty 3

## 2020-11-06 MED ORDER — ACETAMINOPHEN 650 MG RE SUPP: 650.0000 mg | Freq: Four times a day (QID) | RECTAL | Status: DC | PRN

## 2020-11-06 MED ORDER — NITROGLYCERIN 0.4 MG SL SUBL
0.4000 mg | SUBLINGUAL_TABLET | SUBLINGUAL | Status: DC | PRN
  Administered 2020-11-06: 0.4 mg via SUBLINGUAL
  Filled 2020-11-06: qty 1

## 2020-11-06 MED ORDER — ACETAMINOPHEN 325 MG PO TABS
650.0000 mg | ORAL_TABLET | Freq: Four times a day (QID) | ORAL | Status: DC | PRN
  Administered 2020-11-06: 650 mg via ORAL
  Filled 2020-11-06: qty 2

## 2020-11-06 MED ORDER — OXYCODONE HCL 5 MG PO TABS
5.0000 mg | ORAL_TABLET | ORAL | Status: DC | PRN
  Administered 2020-11-06 – 2020-11-12 (×7): 5 mg via ORAL
  Filled 2020-11-06 (×9): qty 1

## 2020-11-06 MED ORDER — HYDROMORPHONE HCL 1 MG/ML IJ SOLN
0.5000 mg | Freq: Once | INTRAMUSCULAR | Status: AC
Start: 2020-11-06 — End: 2020-11-06
  Administered 2020-11-06: 0.5 mg via INTRAVENOUS
  Filled 2020-11-06: qty 1

## 2020-11-06 MED ORDER — ONDANSETRON HCL 4 MG/2ML IJ SOLN
4.0000 mg | Freq: Four times a day (QID) | INTRAMUSCULAR | Status: DC | PRN
  Administered 2020-11-06 – 2020-11-11 (×2): 4 mg via INTRAVENOUS
  Filled 2020-11-06 (×2): qty 2

## 2020-11-06 MED ORDER — SODIUM CHLORIDE 0.9 % IV SOLN
2.0000 g | Freq: Once | INTRAVENOUS | Status: AC
  Administered 2020-11-06: 2 g via INTRAVENOUS
  Filled 2020-11-06: qty 20

## 2020-11-06 NOTE — Progress Notes (Signed)
   11/06/20 1158  Vitals  Temp (!) 97.3 F (36.3 C)  Temp Source Oral  BP 110/73  BP Location Right Arm  BP Method Automatic  Patient Position (if appropriate) Lying  Pulse Rate 86  Pulse Rate Source Monitor  Level of Consciousness  Level of Consciousness Alert  Oxygen Therapy  SpO2 98 %  O2 Device Room Air  Pain Assessment  Pain Scale 0-10  Pain Score 6  Pain Type Acute pain  Pain Location Abdomen  Pain Orientation Upper  Pain Descriptors / Indicators Sharp  Arrived to [address] via wheelchair. Ambulatory to bed, endorsed dizziness, pain and nausea when standing up. Assisted to bed, obtained vital signs, hooked up to telemetry monitor. Noted to be sinus rhythm in the 80s on the monitor. Respirations even and unlabored. Moving all extremities. +2 pulses in upper extremities and lower extremities.Wife at bedside. Provided with call bell. Bed low and locked. Medicated for pain and nausea via MAR. No other needs identified. Will continue to monitor.

## 2020-11-06 NOTE — H&P (Signed)
History and Physical    Alexander Calderon PJK:[id number] DOB: 1961-02-15 DOA: 11/06/2020  PCP: Loyal Jacobson, MD  Patient coming from: Home  Chief Complaint: abdominal pain  HPI: Alexander Calderon is a 60 y.o. male with medical history significant of CAD, HLD, DM2, HTN. Presenting with abdominal pain. It was in the epigastric region. It was sharp, radiating to chest, and constant. He tried to take some gaviscon but it did not help. He denies any N/V/D or fever. He says he's not had pain like this before. When his symptoms didn't abate, he decided to come to the ED. He denies any other aggravating or alleviating factors.    ED Course: EDP spoke with general surgery who agreed to transfer pt to [facility] for evaluation. General surgery review imaging and recommend IV abx and removal of the GB. TRH was called for admission.   Review of Systems: Review of systems is otherwise negative for all not mentioned in HPI.   PMHx Past Medical History:  Diagnosis Date  . Asthma   . Coronary artery disease    a. history of BMS to RCA in 2000 and again in 2002. b. PTCA of the distal LCx in 2011. c. [place] 11/2013: s/p DES to distal RCA, normal LV function (done from L radial approach as pt has history of  difficulty engaging the LCA and significant radial spasm from R radial approach).  . Depression   . GERD (gastroesophageal reflux disease)   . Hyperlipidemia   . Hypertension   . Type 2 diabetes mellitus (HCC)     PSHx Past Surgical History:  Procedure Laterality Date  . APPENDECTOMY    . CORONARY ANGIOPLASTY WITH STENT PLACEMENT    . LEFT HEART CATHETERIZATION WITH CORONARY ANGIOGRAM N/A 11/17/2013   Procedure: LEFT HEART CATHETERIZATION WITH CORONARY ANGIOGRAM;  Surgeon: Peter M Swaziland, MD;  Location: [facility] CATH LAB;  Service: Cardiovascular;  Laterality: N/A;    SocHx  reports that he has never smoked. He has never used smokeless tobacco. He reports current alcohol use of about 1.0 standard drink of alcohol per  week. He reports that he does not use drugs.  Allergies  Allergen Reactions  . Dextrans Nausea Only    FamHx Family History  Problem Relation Age of Onset  . Heart attack Father        age 11  . Coronary artery disease Brother        onset  CAD age 53    Prior to Admission medications   Medication Sig Start Date End Date Taking? Authorizing Provider  albuterol (PROVENTIL HFA;VENTOLIN HFA) 108 (90 BASE) MCG/ACT inhaler Inhale 2 puffs into the lungs every 6 (six) hours as needed for wheezing.    [provider]  aspirin 81 MG tablet Take 81 mg by mouth daily.    [provider]  Azelastine HCl 0.15 % SOLN Place 1 spray into the nose as needed (allergies).  06/25/19   [provider]  buPROPion (WELLBUTRIN XL) 150 MG 24 hr tablet Take 150 mg by mouth every morning. Take 3 tablets every morning 06/25/19   [provider]  chlorpheniramine-HYDROcodone (TUSSIONEX) 10-8 MG/5ML SUER Take 5 mLs by mouth every 12 (twelve) hours as needed for cough. 01/20/20   Alwyn Ren, MD  citalopram (CELEXA) 40 MG tablet Take 40 mg by mouth daily.    [provider]  clopidogrel (PLAVIX) 75 MG tablet TAKE ONE (1) TABLET BY MOUTH EVERY DAY 10/29/20   Kathleene Hazel, MD  ezetimibe (ZETIA) 10 MG tablet Take 10 mg by mouth daily.    [provider]  famotidine (PEPCID) 20 MG tablet Take 2 tablets by mouth as directed. 07/28/19   [provider]  ferrous sulfate 325 (65 FE) MG tablet Take 325 mg by mouth 2 (two) times daily.    [provider]  fluticasone (FLONASE) 50 MCG/ACT nasal spray Place 1 spray into both nostrils as needed for allergies.  06/25/19   [provider]  HYDROcodone-acetaminophen (NORCO/VICODIN) 5-325 MG tablet Take 1 tablet by mouth every 4 (four) hours as needed for moderate pain (cough).  01/13/20   [provider]  hyoscyamine (LEVBID) 0.375 MG 12 hr tablet Take 0.375 mg by mouth 2 (two)  times daily. 01/09/19   [provider]  LORazepam (ATIVAN) 1 MG tablet Take 1 mg by mouth every 8 (eight) hours as needed for anxiety.    [provider]  metFORMIN (GLUCOPHAGE-XR) 500 MG 24 hr tablet Take 2 tablets by mouth daily.  01/11/16   [provider]  metoprolol (TOPROL-XL) 50 MG 24 hr tablet Take 1 tablet by mouth daily.  02/21/11   [provider]  mirtazapine (REMERON) 45 MG tablet Take 1 tab at night 11/08/13   [provider]  nitroGLYCERIN (NITROSTAT) 0.4 MG SL tablet DISSOLVE 1 TABLET UNDER TONGUE EVERY 5 MINUTES FOR UP TO 3 DOSES AS NEEDED FOR CHEST PAIN 03/10/19   Kathleene Hazel, MD  omeprazole (PRILOSEC) 40 MG capsule Take 40 mg by mouth daily. 01/11/16   [provider]  rosuvastatin (CRESTOR) 40 MG tablet Take 40 mg by mouth daily.    [provider]  sildenafil (REVATIO) 20 MG tablet Take 40-100 mg by mouth as needed (erectile dysfunction).  05/16/17   [provider]  SINGULAIR 10 MG tablet Take 10 mg by mouth daily.  02/14/11   [provider]  sitaGLIPtin (JANUVIA) 100 MG tablet Take 100 mg by mouth daily. 06/25/19   [provider]    Physical Exam: Vitals:   11/06/20 0630 11/06/20 0740 11/06/20 0841 11/06/20 0900  BP: 137/86 140/73 126/69 135/72  Pulse: 82 80 84 85  Resp: 18 20  16   Temp:  97.8 F (36.6 C)    TempSrc:  Oral    SpO2: 100% 98% 100% 93%  Weight:      Height:        General: 60 y.o. male resting in bed in NAD Eyes: PERRL, normal sclera ENMT: Nares patent w/o discharge, orophaynx clear, dentition normal, ears w/o discharge/lesions/ulcers Neck: Supple, trachea midline Cardiovascular: RRR, +S1, S2, no m/g/r, equal pulses throughout Respiratory: CTABL, no w/r/r, normal WOB GI: BS+, ND, epigastric TTP, no masses noted, no organomegaly noted MSK: No e/c/c Skin: No rashes, bruises, ulcerations noted Neuro: A&O x 3, no focal deficits Psyc: Appropriate interaction  and affect, calm/cooperative  Labs on Admission: I have personally reviewed following labs and imaging studies  CBC: Recent Labs  Lab 11/06/20 0444  WBC 10.1  NEUTROABS 8.0*  HGB 12.6*  HCT 38.0*  MCV 85.6  PLT 223   Basic Metabolic Panel: Recent Labs  Lab 11/06/20 0444  NA 139  K 3.9  CL 101  CO2 28  GLUCOSE 132*  BUN 18  CREATININE 0.85  CALCIUM 8.8*   GFR: Estimated Creatinine Clearance: 82.4 mL/min (by C-G formula based on SCr of 0.85 mg/dL). Liver Function Tests: Recent Labs  Lab 11/06/20 0444  AST 36  ALT 49*  ALKPHOS 65  BILITOT 0.3  PROT 6.7  ALBUMIN 4.1   No results for input(s): LIPASE, AMYLASE in the last 168 hours. No results for input(s): AMMONIA in the last 168 hours. Coagulation Profile: No results for input(s): INR, PROTIME in the last 168 hours. Cardiac Enzymes: No results for input(s): CKTOTAL, CKMB, CKMBINDEX, TROPONINI in the last 168 hours. BNP (last 3 results) No results for input(s): PROBNP in the last 8760 hours. HbA1C: No results for input(s): HGBA1C in the last 72 hours. CBG: No results for input(s): GLUCAP in the last 168 hours. Lipid Profile: No results for input(s): CHOL, HDL, LDLCALC, TRIG, CHOLHDL, LDLDIRECT in the last 72 hours. Thyroid Function Tests: No results for input(s): TSH, T4TOTAL, FREET4, T3FREE, THYROIDAB in the last 72 hours. Anemia Panel: No results for input(s): VITAMINB12, FOLATE, FERRITIN, TIBC, IRON, RETICCTPCT in the last 72 hours. Urine analysis:    Component Value Date/Time   COLORURINE YELLOW 06/13/2020 1855   APPEARANCEUR CLEAR 06/13/2020 1855   LABSPEC 1.010 06/13/2020 1855   PHURINE 6.0 06/13/2020 1855   GLUCOSEU NEGATIVE 06/13/2020 1855   HGBUR NEGATIVE 06/13/2020 1855   BILIRUBINUR NEGATIVE 06/13/2020 1855   KETONESUR NEGATIVE 06/13/2020 1855   PROTEINUR NEGATIVE 06/13/2020 1855   NITRITE NEGATIVE 06/13/2020 1855   LEUKOCYTESUR NEGATIVE 06/13/2020 1855    Radiological Exams on  Admission: DG Chest Portable 1 View  Result Date: 11/06/2020 CLINICAL DATA:  Epigastric pain EXAM: PORTABLE CHEST 1 VIEW COMPARISON:  01/17/2020 FINDINGS: The heart size and mediastinal contours are within normal limits. Both lungs are clear. The visualized skeletal structures are unremarkable. IMPRESSION: No active disease. Electronically Signed   By: Helyn Numbers MD   On: 11/06/2020 04:51    EKG: Independently reviewed. Sinus, no st elevations  Assessment/Plan Cholecystitis Abdominal pain     - admit to inpt, med-surg     - IV rocephin, anti-emetics, pain control     - General surgery onboard; request that patient be off plavix for 5 days     - Chales Abrahams perioperative risk is 0.1%  CAD     - multiple stents; currently on ASA/plavix/statin     - hold plavix per surgery request     - spoke with cardiology; looks like his last intervention was 2015 w/ a DES, ok to hold plavix and no need for heparin gtt  HTN     - continue home regimen  HLD     - continue home regimen  DM2     - SSI, DM diet, A1c, glucose checks  GERD     - protonix, maalox   DVT prophylaxis: lovenox  Code Status: FULL  Family Communication: W/ wife at bedside  Consults called: General surgery, Sidebarred cardiology  Status is: Inpatient  Remains inpatient appropriate because:Inpatient level of care appropriate due to severity of illness   Dispo: The patient is from: Home              Anticipated d/c is to: Home              Patient currently is not medically stable to d/c.   Difficult to place patient No  Teddy Spike DO [facility]  If 7PM-7AM, please contact night-coverage [email or web address]  11/06/2020, 10:10 AM

## 2020-11-06 NOTE — ED Triage Notes (Signed)
Pt reports upper abd / epigastric pain that started approx 1.5 hours ago. States he took an antiacid without relief.

## 2020-11-06 NOTE — Consult Note (Signed)
Reason for Consult: Cholecystitis Referring Physician: Dr. Randall Hiss Alexander Calderon is an 60 y.o. male.  HPI: Patient is a 60 year old male, who comes in with a history of CAD with stents, on Plavix, history of hypertension hyperlipidemia and diabetes. Patient comes in with abdominal pain that began at 2:30 AM.  Patient states that this radiated to his chest.  Patient denies any nausea or vomiting, diarrhea. Patient was seen in the ER secondary to continued pain.  Patient underwent ultrasound and CT scan as well as cardiac work-up.  Patient was found to have no MI.  Ultrasound did reveal sludge some thickening of the gallbladder wall.  I did review the ultrasound personally.  Patient had a WBC count of 10.1.  Patient's LFTs were within normal limit.  Patient has a history of previous open appendectomy at age 68. Patient sees Dr. Clifton James his cardiologist.  Patient states he last took his Plavix last night.  General surgery was consulted for evaluation.  Past Medical History:  Diagnosis Date  . Asthma   . Coronary artery disease    a. history of BMS to RCA in 2000 and again in 2002. b. PTCA of the distal LCx in 2011. c. [place] 11/2013: s/p DES to distal RCA, normal LV function (done from L radial approach as pt has history of  difficulty engaging the LCA and significant radial spasm from R radial approach).  . Depression   . GERD (gastroesophageal reflux disease)   . Hyperlipidemia   . Hypertension   . Type 2 diabetes mellitus (HCC)     Past Surgical History:  Procedure Laterality Date  . APPENDECTOMY    . CORONARY ANGIOPLASTY WITH STENT PLACEMENT    . LEFT HEART CATHETERIZATION WITH CORONARY ANGIOGRAM N/A 11/17/2013   Procedure: LEFT HEART CATHETERIZATION WITH CORONARY ANGIOGRAM;  Surgeon: Peter M Swaziland, MD;  Location: [facility] CATH LAB;  Service: Cardiovascular;  Laterality: N/A;    Family History  Problem Relation Age of Onset  . Heart attack Father        age 26  . Coronary artery disease  Brother        onset  CAD age 72    Social History:  reports that he has never smoked. He has never used smokeless tobacco. He reports current alcohol use of about 1.0 standard drink of alcohol per week. He reports that he does not use drugs.  Allergies:  Allergies  Allergen Reactions  . Dextrans Nausea Only    Medications: I have reviewed the patient's current medications.  Results for orders placed or performed during the hospital encounter of 11/06/20 (from the past 48 hour(s))  CBC with Differential/Platelet     Status: Abnormal   Collection Time: 11/06/20  4:44 AM  Result Value Ref Range   WBC 10.1 4.0 - 10.5 K/uL   RBC 4.44 4.22 - 5.81 MIL/uL   Hemoglobin 12.6 (L) 13.0 - 17.0 g/dL   HCT 53.6 (L) 64.4 - 03.4 %   MCV 85.6 80.0 - 100.0 fL   MCH 28.4 26.0 - 34.0 pg   MCHC 33.2 30.0 - 36.0 g/dL   RDW 74.2 59.5 - 63.8 %   Platelets 223 150 - 400 K/uL   nRBC 0.0 0.0 - 0.2 %   Neutrophils Relative % 80 %   Neutro Abs 8.0 (H) 1.7 - 7.7 K/uL   Lymphocytes Relative 11 %   Lymphs Abs 1.2 0.7 - 4.0 K/uL   Monocytes Relative 8 %   Monocytes Absolute 0.8  0.1 - 1.0 K/uL   Eosinophils Relative 1 %   Eosinophils Absolute 0.1 0.0 - 0.5 K/uL   Basophils Relative 0 %   Basophils Absolute 0.0 0.0 - 0.1 K/uL   Immature Granulocytes 0 %   Abs Immature Granulocytes 0.02 0.00 - 0.07 K/uL    Comment: Performed at [facility] [place], [address] [place], [place] [address]  Comprehensive metabolic panel     Status: Abnormal   Collection Time: 11/06/20  4:44 AM  Result Value Ref Range   Sodium 139 135 - 145 mmol/L   Potassium 3.9 3.5 - 5.1 mmol/L   Chloride 101 98 - 111 mmol/L   CO2 28 22 - 32 mmol/L   Glucose, Bld 132 (H) 70 - 99 mg/dL    Comment: Glucose reference range applies only to samples taken after fasting for at least 8 hours.   BUN 18 6 - 20 mg/dL   Creatinine, Ser 6.040.85 0.61 - 1.24 mg/dL   Calcium 8.8 (L) 8.9 - 10.3 mg/dL   Total Protein 6.7 6.5 - 8.1 g/dL   Albumin  4.1 3.5 - 5.0 g/dL   AST 36 15 - 41 U/L   ALT 49 (H) 0 - 44 U/L   Alkaline Phosphatase 65 38 - 126 U/L   Total Bilirubin 0.3 0.3 - 1.2 mg/dL   GFR, Estimated >54>60 >09>60 mL/min    Comment: (NOTE) Calculated using the CKD-EPI Creatinine Equation (2021)    Anion gap 10 5 - 15    Comment: Performed at [facility] [place], [address] [place], [place] [address]  Troponin I (High Sensitivity)     Status: None   Collection Time: 11/06/20  4:44 AM  Result Value Ref Range   Troponin I (High Sensitivity) 2 <18 ng/L    Comment: (NOTE) Elevated high sensitivity troponin I (hsTnI) values and significant  changes across serial measurements may suggest ACS but many other  chronic and acute conditions are known to elevate hsTnI results.  Refer to the "Links" section for chest pain algorithms and additional  guidance. Performed at [facility] [place], [address] [place], [place] [address]   Troponin I (High Sensitivity)     Status: None   Collection Time: 11/06/20  6:25 AM  Result Value Ref Range   Troponin I (High Sensitivity) <2 <18 ng/L    Comment: (NOTE) Elevated high sensitivity troponin I (hsTnI) values and significant  changes across serial measurements may suggest ACS but many other  chronic and acute conditions are known to elevate hsTnI results.  Refer to the "Links" section for chest pain algorithms and additional  guidance. Performed at [facility] [place], [address] [place], [place] [address]   Resp Panel by RT-PCR (Flu A&B, Covid) Nasopharyngeal Swab     Status: None   Collection Time: 11/06/20  6:25 AM   Specimen: Nasopharyngeal Swab; Nasopharyngeal(NP) swabs in vial transport medium  Result Value Ref Range   SARS Coronavirus 2 by RT PCR NEGATIVE NEGATIVE    Comment: (NOTE) SARS-CoV-2 target nucleic acids are NOT DETECTED.  The SARS-CoV-2 RNA is generally detectable in upper respiratory specimens during the acute phase of infection. The  lowest concentration of SARS-CoV-2 viral copies this assay can detect is 138 copies/mL. A negative result does not preclude SARS-Cov-2 infection and should not be used as the sole basis for treatment or other patient management decisions. A negative result may occur with  improper specimen  collection/handling, submission of specimen other than nasopharyngeal swab, presence of viral mutation(s) within the areas targeted by this assay, and inadequate number of viral copies(<138 copies/mL). A negative result must be combined with clinical observations, patient history, and epidemiological information. The expected result is Negative.  Fact Sheet for Patients:  BloggerCourse.com  Fact Sheet for Healthcare Providers:  SeriousBroker.it  This test is no t yet approved or cleared by the [place] FDA and  has been authorized for detection and/or diagnosis of SARS-CoV-2 by FDA under an Emergency Use Authorization (EUA). This EUA will remain  in effect (meaning this test can be used) for the duration of the COVID-19 declaration under Section 564(b)(1) of the Act, 21 U.S.C.section 360bbb-3(b)(1), unless the authorization is terminated  or revoked sooner.       Influenza A by PCR NEGATIVE NEGATIVE   Influenza B by PCR NEGATIVE NEGATIVE    Comment: (NOTE) The Xpert Xpress SARS-CoV-2/FLU/RSV plus assay is intended as an aid in the diagnosis of influenza from Nasopharyngeal swab specimens and should not be used as a sole basis for treatment. Nasal washings and aspirates are unacceptable for Xpert Xpress SARS-CoV-2/FLU/RSV testing.  Fact Sheet for Patients: BloggerCourse.com  Fact Sheet for Healthcare Providers: SeriousBroker.it  This test is not yet approved or cleared by the [place] FDA and has been authorized for detection and/or diagnosis of SARS-CoV-2 by FDA under an Emergency  Use Authorization (EUA). This EUA will remain in effect (meaning this test can be used) for the duration of the COVID-19 declaration under Section 564(b)(1) of the Act, 21 U.S.C. section 360bbb-3(b)(1), unless the authorization is terminated or revoked.  Performed at [facility], [address] [place], [place] [address]     DG Chest Portable 1 View  Result Date: 11/06/2020 CLINICAL DATA:  Epigastric pain EXAM: PORTABLE CHEST 1 VIEW COMPARISON:  01/17/2020 FINDINGS: The heart size and mediastinal contours are within normal limits. Both lungs are clear. The visualized skeletal structures are unremarkable. IMPRESSION: No active disease. Electronically Signed   By: Helyn Numbers MD   On: 11/06/2020 04:51    Review of Systems  Constitutional: Negative for chills and fever.  HENT: Negative for ear discharge, hearing loss and sore throat.   Eyes: Negative for discharge.  Respiratory: Negative for cough and shortness of breath.   Cardiovascular: Negative for chest pain and leg swelling.  Gastrointestinal: Positive for abdominal pain. Negative for constipation, diarrhea, nausea and vomiting.  Musculoskeletal: Negative for myalgias and neck pain.  Skin: Negative for rash.  Allergic/Immunologic: Negative for environmental allergies.  Neurological: Negative for dizziness and seizures.  Hematological: Does not bruise/bleed easily.  Psychiatric/Behavioral: Negative for suicidal ideas.  All other systems reviewed and are negative.  Blood pressure 135/72, pulse 85, temperature 97.8 F (36.6 C), temperature source Oral, resp. rate 16, height 5\' 5"  (1.651 m), weight 70.3 kg, SpO2 93 %. Physical Exam Constitutional:      General: Vital signs are normal.     Appearance: He is well-developed and well-nourished.     Comments: Conversant No acute distress  Eyes:     General: Lids are normal. No scleral icterus.    Comments: Pupils are equal round and reactive No lid lag Moist  conjunctiva  Neck:     Thyroid: No thyromegaly.     Trachea: No tracheal tenderness.     Comments: No cervical lymphadenopathy Cardiovascular:     Rate and Rhythm: Normal rate and regular rhythm.  Pulses: Intact distal pulses.     Heart sounds: No murmur heard.   Pulmonary:     Effort: Pulmonary effort is normal.     Breath sounds: Normal breath sounds. No wheezing or rales.  Abdominal:     Palpations: There is no hepatosplenomegaly.     Tenderness: There is abdominal tenderness (epigastrum).     Hernia: No hernia is present.  Skin:    General: Skin is warm.     Findings: No rash.     Nails: There is no clubbing or cyanosis.     Comments: Normal skin turgor  Neurological:     Mental Status: He is alert and oriented to person, place, and time.     Comments: Normal gait and station  Psychiatric:        Judgment: Judgment normal.     Comments: Appropriate affect     Assessment/Plan: 60 year old male with likely acute cholecystitis CAD-on Plavix Hypertension Diabetes  1.  Would recommend medical admission secondary patient's CAD hypertension and on Plavix.  Patient will likely need to be on heparin drip prior to surgery.  Patient would like to be off of Plavix for 5 days prior to surgery. Patient okay for a liquid diet. 2.  We will start Rocephin 3.  Patient may require cardiac clearance prior to surgery.  Axel Filler 11/06/2020, 9:48 AM

## 2020-11-06 NOTE — ED Provider Notes (Signed)
[facility] EMERGENCY DEPARTMENT Provider Note   CSN: [id number] Arrival date & time: 11/06/20  [id number]     History Chief Complaint  Patient presents with  . Abdominal Pain    Alexander Calderon is a 60 y.o. male.  The history is provided by the patient.  Abdominal Pain Pain location:  Epigastric Pain quality: sharp   Pain radiates to:  Chest Pain severity:  Severe Onset quality:  Sudden Timing:  Constant Progression:  Unchanged Chronicity:  New Context: not trauma   Relieved by:  Nothing Worsened by:  Nothing Ineffective treatments:  Antacids Associated symptoms: no constipation, no cough, no diarrhea, no dysuria, no fever, no flatus and no vomiting        Past Medical History:  Diagnosis Date  . Asthma   . Coronary artery disease    a. history of BMS to RCA in 2000 and again in 2002. b. PTCA of the distal LCx in 2011. c. [place] 11[place]2013: s/p DES to distal RCA, normal LV function (done from L radial approach as pt has history of  difficulty engaging the LCA and significant radial spasm from R radial approach).  . Depression   . GERD (gastroesophageal reflux disease)   . Hyperlipidemia   . Hypertension   . Type 2 diabetes mellitus [facility](HCC)     Patient Active Problem List   Diagnosis Date Noted  . Hypoxia 01/18/2020  . Acute respiratory failure with hypoxia (HCC) 01/17/2020  . Unstable angina pectoris (HCC) 11/16/2013  . Type 2 diabetes mellitus (HCC)   . Depression   . GERD (gastroesophageal reflux disease)   . Hyperlipidemia   . Hypertension   . CAD (coronary artery disease), native coronary artery   . Asthma     Past Surgical History:  Procedure Laterality Date  . APPENDECTOMY    . CORONARY ANGIOPLASTY WITH STENT PLACEMENT    . LEFT HEART CATHETERIZATION WITH CORONARY ANGIOGRAM N/A 11/17/2013   Procedure: LEFT HEART CATHETERIZATION WITH CORONARY ANGIOGRAM;  Surgeon: Peter M SwazilandJordan, MD;  Location: [facility] CATH LAB;  Service: Cardiovascular;  Laterality: N/A;        Family History  Problem Relation Age of Onset  . Heart attack Father        age 58  . Coronary artery disease Brother        onset  CAD age 58    Social History   Tobacco Use  . Smoking status: Never Smoker  . Smokeless tobacco: Never Used  Vaping Use  . Vaping Use: Never used  Substance Use Topics  . Alcohol use: Yes    Alcohol/week: 1.0 standard drink    Types: 1 drink(s) per week  . Drug use: No    Home Medications Prior to Admission medications   Medication Sig Start Date End Date Taking? Authorizing Provider  albuterol (PROVENTIL HFA;VENTOLIN HFA) 108 (90 BASE) MCG/ACT inhaler Inhale 2 puffs into the lungs every 6 (six) hours as needed for wheezing.    [provider]  aspirin 81 MG tablet Take 81 mg by mouth daily.    [provider]  Azelastine HCl 0.15 % SOLN Place 1 spray into the nose as needed (allergies).  06/25/19   [provider]  buPROPion (WELLBUTRIN XL) 150 MG 24 hr tablet Take 150 mg by mouth every morning. Take 3 tablets every morning 06/25/19   [provider]  chlorpheniramine-HYDROcodone (TUSSIONEX) 10-8 MG/5ML SUER Take 5 mLs by mouth every 12 (twelve) hours as needed for cough. 01/20/20  Alwyn Ren, MD  citalopram (CELEXA) 40 MG tablet Take 40 mg by mouth daily.    [provider]  clopidogrel (PLAVIX) 75 MG tablet TAKE ONE (1) TABLET BY MOUTH EVERY DAY 10/29/20   Kathleene Hazel, MD  ezetimibe (ZETIA) 10 MG tablet Take 10 mg by mouth daily.    [provider]  famotidine (PEPCID) 20 MG tablet Take 2 tablets by mouth as directed. 07/28/19   [provider]  ferrous sulfate 325 (65 FE) MG tablet Take 325 mg by mouth 2 (two) times daily.    [provider]  fluticasone (FLONASE) 50 MCG/ACT nasal spray Place 1 spray into both nostrils as needed for allergies.  06/25/19   [provider]  HYDROcodone-acetaminophen (NORCO/VICODIN) 5-325 MG tablet Take 1  tablet by mouth every 4 (four) hours as needed for moderate pain (cough).  01/13/20   [provider]  hyoscyamine (LEVBID) 0.375 MG 12 hr tablet Take 0.375 mg by mouth 2 (two) times daily. 01/09/19   [provider]  LORazepam (ATIVAN) 1 MG tablet Take 1 mg by mouth every 8 (eight) hours as needed for anxiety.    [provider]  metFORMIN (GLUCOPHAGE-XR) 500 MG 24 hr tablet Take 2 tablets by mouth daily.  01/11/16   [provider]  metoprolol (TOPROL-XL) 50 MG 24 hr tablet Take 1 tablet by mouth daily.  02/21/11   [provider]  mirtazapine (REMERON) 45 MG tablet Take 1 tab at night 11/08/13   [provider]  nitroGLYCERIN (NITROSTAT) 0.4 MG SL tablet DISSOLVE 1 TABLET UNDER TONGUE EVERY 5 MINUTES FOR UP TO 3 DOSES AS NEEDED FOR CHEST PAIN 03/10/19   Kathleene Hazel, MD  omeprazole (PRILOSEC) 40 MG capsule Take 40 mg by mouth daily. 01/11/16   [provider]  rosuvastatin (CRESTOR) 40 MG tablet Take 40 mg by mouth daily.    [provider]  sildenafil (REVATIO) 20 MG tablet Take 40-100 mg by mouth as needed (erectile dysfunction).  05/16/17   [provider]  SINGULAIR 10 MG tablet Take 10 mg by mouth daily.  02/14/11   [provider]  sitaGLIPtin (JANUVIA) 100 MG tablet Take 100 mg by mouth daily. 06/25/19   [provider]    Allergies    Dextrans  Review of Systems   Review of Systems  Constitutional: Negative for fever.  Respiratory: Negative for cough.   Gastrointestinal: Positive for abdominal pain. Negative for constipation, diarrhea, flatus and vomiting.  Genitourinary: Negative for dysuria.  All other systems reviewed and are negative.   Physical Exam Updated Vital Signs BP (!) 166/134 (BP Location: Right Arm)   Pulse 85   Temp 97.8 F (36.6 C) (Oral)   Ht 5\' 5"  (1.651 m)   Wt 70.3 kg   SpO2 98%   BMI 25.79 kg/m   Physical Exam Vitals and nursing note reviewed.   Constitutional:      Appearance: Normal appearance. He is not ill-appearing.  HENT:     Head: Normocephalic and atraumatic.     Nose: Nose normal.  Eyes:     Conjunctiva/sclera: Conjunctivae normal.     Pupils: Pupils are equal, round, and reactive to light.  Cardiovascular:     Rate and Rhythm: Normal rate and regular rhythm.     Pulses: Normal pulses.     Heart sounds: Normal heart sounds.  Pulmonary:     Effort: Pulmonary effort is normal.     Breath sounds:  Normal breath sounds.  Abdominal:     General: Abdomen is flat. Bowel sounds are normal.     Palpations: Abdomen is soft.     Tenderness: There is generalized abdominal tenderness. There is no guarding. Negative signs include McBurney's sign.    Musculoskeletal:        General: Normal range of motion.     Cervical back: Normal range of motion and neck supple.  Skin:    General: Skin is warm and dry.     Capillary Refill: Capillary refill takes less than 2 seconds.  Neurological:     General: No focal deficit present.     Mental Status: He is alert and oriented to person, place, and time.     Deep Tendon Reflexes: Reflexes normal.  Psychiatric:        Mood and Affect: Mood normal.        Behavior: Behavior normal.     ED Results / Procedures / Treatments   Labs (all labs ordered are listed, but only abnormal results are displayed) Results for orders placed or performed during the hospital encounter of 11/06/20  CBC with Differential/Platelet  Result Value Ref Range   WBC 10.1 4.0 - 10.5 K/uL   RBC 4.44 4.22 - 5.81 MIL/uL   Hemoglobin 12.6 (L) 13.0 - 17.0 g/dL   HCT 35.3 (L) 29.9 - 24.2 %   MCV 85.6 80.0 - 100.0 fL   MCH 28.4 26.0 - 34.0 pg   MCHC 33.2 30.0 - 36.0 g/dL   RDW 68.3 41.9 - 62.2 %   Platelets 223 150 - 400 K/uL   nRBC 0.0 0.0 - 0.2 %   Neutrophils Relative % 80 %   Neutro Abs 8.0 (H) 1.7 - 7.7 K/uL   Lymphocytes Relative 11 %   Lymphs Abs 1.2 0.7 - 4.0 K/uL   Monocytes Relative 8 %    Monocytes Absolute 0.8 0.1 - 1.0 K/uL   Eosinophils Relative 1 %   Eosinophils Absolute 0.1 0.0 - 0.5 K/uL   Basophils Relative 0 %   Basophils Absolute 0.0 0.0 - 0.1 K/uL   Immature Granulocytes 0 %   Abs Immature Granulocytes 0.02 0.00 - 0.07 K/uL  Comprehensive metabolic panel  Result Value Ref Range   Sodium 139 135 - 145 mmol/L   Potassium 3.9 3.5 - 5.1 mmol/L   Chloride 101 98 - 111 mmol/L   CO2 28 22 - 32 mmol/L   Glucose, Bld 132 (H) 70 - 99 mg/dL   BUN 18 6 - 20 mg/dL   Creatinine, Ser 2.97 0.61 - 1.24 mg/dL   Calcium 8.8 (L) 8.9 - 10.3 mg/dL   Total Protein 6.7 6.5 - 8.1 g/dL   Albumin 4.1 3.5 - 5.0 g/dL   AST 36 15 - 41 U/L   ALT 49 (H) 0 - 44 U/L   Alkaline Phosphatase 65 38 - 126 U/L   Total Bilirubin 0.3 0.3 - 1.2 mg/dL   GFR, Estimated >98 >92 mL/min   Anion gap 10 5 - 15  Troponin I (High Sensitivity)  Result Value Ref Range   Troponin I (High Sensitivity) 2 <18 ng/L   DG Chest Portable 1 View  Result Date: 11/06/2020 CLINICAL DATA:  Epigastric pain EXAM: PORTABLE CHEST 1 VIEW COMPARISON:  01/17/2020 FINDINGS: The heart size and mediastinal contours are within normal limits. Both lungs are clear. The visualized skeletal structures are unremarkable. IMPRESSION: No active disease. Electronically Signed   By: Helyn Numbers MD  On: 11/06/2020 04:51    EKG EKG Interpretation  Date/Time:  Saturday November 06 2020 04:31:28 EST Ventricular Rate:  86 PR Interval:    QRS Duration: 92 QT Interval:  399 QTC Calculation: 478 R Axis:   81 Text Interpretation: Sinus rhythm Confirmed by Izora Benn ([id number]) on 11/06/2020 4:35:26 AM   Radiology DG Chest Portable 1 View  Result Date: 11/06/2020 CLINICAL DATA:  Epigastric pain EXAM: PORTABLE CHEST 1 VIEW COMPARISON:  01/17/2020 FINDINGS: The heart size and mediastinal contours are within normal limits. Both lungs are clear. The visualized skeletal structures are unremarkable. IMPRESSION: No active disease. Electronically  Signed   By: Helyn Numbers MD   On: 11/06/2020 04:51    Procedures Procedures   Medications Ordered in ED Medications  nitroGLYCERIN (NITROSTAT) SL tablet 0.4 mg (0.4 mg Sublingual Given 11/06/20 0505)  cefTRIAXone (ROCEPHIN) 2 g in sodium chloride 0.9 % 100 mL IVPB (has no administration in time range)  alum & mag hydroxide-simeth (MAALOX/MYLANTA) 200-200-20 MG/5ML suspension 30 mL (30 mLs Oral Given 11/06/20 0435)    And  lidocaine (XYLOCAINE) 2 % viscous mouth solution 15 mL (15 mLs Oral Given 11/06/20 0435)  hyoscyamine (LEVSIN SL) SL tablet 0.125 mg (0.125 mg Sublingual Given 11/06/20 0448)  fentaNYL (SUBLIMAZE) injection 50 mcg (50 mcg Intravenous Given 11/06/20 0506)  iohexol (OMNIPAQUE) 350 MG/ML injection 100 mL (100 mLs Intravenous Contrast Given 11/06/20 0536)  morphine 4 MG/ML injection 4 mg (4 mg Intravenous Given 11/06/20 5670)    ED Course  I have reviewed the triage vital signs and the nursing notes.  Pertinent labs & imaging results that were available during my care of the patient were reviewed by me and considered in my medical decision making (see chart for details).    Case d/w Dr. Derrell Lolling of [facility] surgery.  Please transfer ED to ED.  CCS to be called for assessment upon arrival   Case d/w Dr. Blinda Leatherwood who is aware of the transfer Final Clinical Impression(s) / ED Diagnoses Final diagnoses:  Pain  Cholecystitis    Transfer to [facility] ED   Shanda Cadotte, MD 11/06/20 1410

## 2020-11-06 NOTE — ED Provider Notes (Signed)
Patient transferred for midsternal [place] to be seen by general surgeon.  Discussed with Dr. Magnus Ivan who will see patient   Alexander Nick, MD 11/06/20 [phone]

## 2020-11-07 DIAGNOSIS — R52 Pain, unspecified: Secondary | ICD-10-CM

## 2020-11-07 LAB — CBC
HCT: 36.9 % — ABNORMAL LOW (ref 39.0–52.0)
Hemoglobin: 11.9 g/dL — ABNORMAL LOW (ref 13.0–17.0)
MCH: 27.9 pg (ref 26.0–34.0)
MCHC: 32.2 g/dL (ref 30.0–36.0)
MCV: 86.4 fL (ref 80.0–100.0)
Platelets: 157 10*3/uL (ref 150–400)
RBC: 4.27 MIL/uL (ref 4.22–5.81)
RDW: 12.8 % (ref 11.5–15.5)
WBC: 5.1 10*3/uL (ref 4.0–10.5)
nRBC: 0 % (ref 0.0–0.2)

## 2020-11-07 LAB — COMPREHENSIVE METABOLIC PANEL
ALT: 4185 U/L — ABNORMAL HIGH (ref 0–44)
AST: 5379 U/L — ABNORMAL HIGH (ref 15–41)
Albumin: 3.6 g/dL (ref 3.5–5.0)
Alkaline Phosphatase: 309 U/L — ABNORMAL HIGH (ref 38–126)
Anion gap: 8 (ref 5–15)
BUN: 15 mg/dL (ref 6–20)
CO2: 29 mmol/L (ref 22–32)
Calcium: 8.8 mg/dL — ABNORMAL LOW (ref 8.9–10.3)
Chloride: 100 mmol/L (ref 98–111)
Creatinine, Ser: 0.89 mg/dL (ref 0.61–1.24)
GFR, Estimated: >60 mL/min (ref >60)
Glucose, Bld: 183 mg/dL — ABNORMAL HIGH (ref 70–99)
Potassium: 4.5 mmol/L (ref 3.5–5.1)
Sodium: 137 mmol/L (ref 135–145)
Total Bilirubin: 2.9 mg/dL — ABNORMAL HIGH (ref 0.3–1.2)
Total Protein: 6.1 g/dL — ABNORMAL LOW (ref 6.5–8.1)

## 2020-11-07 LAB — HEPATITIS PANEL, ACUTE
HCV Ab: NONREACTIVE
Hep A IgM: NONREACTIVE
Hep B C IgM: NONREACTIVE
Hepatitis B Surface Ag: NONREACTIVE

## 2020-11-07 LAB — GLUCOSE, CAPILLARY
Glucose-Capillary: 137 mg/dL — ABNORMAL HIGH (ref 70–99)
Glucose-Capillary: 186 mg/dL — ABNORMAL HIGH (ref 70–99)
Glucose-Capillary: 161 mg/dL — ABNORMAL HIGH (ref 70–99)
Glucose-Capillary: 159 mg/dL — ABNORMAL HIGH (ref 70–99)
Glucose-Capillary: 146 mg/dL — ABNORMAL HIGH (ref 70–99)

## 2020-11-07 LAB — AMMONIA: Ammonia: 27 umol/L (ref 9–35)

## 2020-11-07 LAB — PROTIME-INR
INR: 1.3 — ABNORMAL HIGH (ref 0.8–1.2)
Prothrombin Time: 15.3 seconds — ABNORMAL HIGH (ref 11.4–15.2)

## 2020-11-07 MED ORDER — DIPHENHYDRAMINE HCL 25 MG PO CAPS
25.0000 mg | ORAL_CAPSULE | Freq: Four times a day (QID) | ORAL | Status: DC | PRN
  Administered 2020-11-07 (×2): 25 mg via ORAL
  Filled 2020-11-07 (×2): qty 1

## 2020-11-07 NOTE — Progress Notes (Signed)
Subjective/Chief Complaint: Pt doing well tol PO abd pain better   Objective: Vital signs in last 24 hours: Temp:  [97.3 F (36.3 C)-98.4 F (36.9 C)] 98 F (36.7 C) (03/06 0630) Pulse Rate:  [85-96] 86 (03/06 0630) Resp:  [16-22] 18 (03/05 2018) BP: (109-135)/(64-73) 109/65 (03/06 0630) SpO2:  [93 %-98 %] 95 % (03/06 0630) Weight:  [70.3 kg] 70.3 kg (03/05 1158) Last BM Date: 11/05/20  Intake/Output from previous day: 03/05 0701 - 03/06 0700 In: 96.9 [IV Piggyback:96.9] Out: -  Intake/Output this shift: No intake/output data recorded.  PE:  Constitutional: No acute distress, conversant, appears states age. Eyes: Anicteric sclerae, moist conjunctiva, no lid lag Lungs: Clear to auscultation bilaterally, normal respiratory effort CV: regular rate and rhythm, no murmurs, no peripheral edema, pedal pulses 2+ GI: Soft, no masses or hepatosplenomegaly, non-tender to palpation Skin: No rashes, palpation reveals normal turgor Psychiatric: appropriate judgment and insight, oriented to person, place, and time   Lab Results:  Recent Labs    11/06/20 1229 11/07/20 0353  WBC 6.5 5.1  HGB 12.2* 11.9*  HCT 37.5* 36.9*  PLT 164 157   BMET Recent Labs    11/06/20 0444 11/06/20 1229 11/07/20 0353  NA 139  --  137  K 3.9  --  4.5  CL 101  --  100  CO2 28  --  29  GLUCOSE 132*  --  183*  BUN 18  --  15  CREATININE 0.85 0.75 0.89  CALCIUM 8.8*  --  8.8*   PT/INR No results for input(s): LABPROT, INR in the last 72 hours. ABG No results for input(s): PHART, HCO3 in the last 72 hours.  Invalid input(s): PCO2, PO2  Studies/Results: DG Chest Portable 1 View  Result Date: 11/06/2020 CLINICAL DATA:  Epigastric pain EXAM: PORTABLE CHEST 1 VIEW COMPARISON:  01/17/2020 FINDINGS: The heart size and mediastinal contours are within normal limits. Both lungs are clear. The visualized skeletal structures are unremarkable. IMPRESSION: No active disease. Electronically Signed    By: Helyn Numbers MD   On: 11/06/2020 04:51   CT Angio Chest/Abd/Pel for Dissection W and/or Wo Contrast  Result Date: 11/06/2020 CLINICAL DATA:  Chest and abdominal pain EXAM: CT ANGIOGRAPHY CHEST, ABDOMEN AND PELVIS TECHNIQUE: Non-contrast CT of the chest was initially obtained. Multidetector CT imaging through the chest, abdomen and pelvis was performed using the standard protocol during bolus administration of intravenous contrast. Multiplanar reconstructed images and MIPs were obtained and reviewed to evaluate the vascular anatomy. CONTRAST:  OMNIPAQUE IOHEXOL 350 MG/ML SOLN COMPARISON:  06/25/2020 FINDINGS: CTA CHEST FINDINGS Cardiovascular: The thoracic aorta is normal in caliber. No evidence of intramural hematoma, dissection, or aneurysm. The arch vasculature demonstrates normal anatomic configuration and is widely patent proximally. No significant atherosclerotic plaque. Right coronary artery stenting has been performed. Global cardiac size within normal limits. No pericardial effusion. Central pulmonary arteries are of normal caliber. Mediastinum/Nodes: The thyroid is unremarkable. No pathologic thoracic adenopathy. Small hiatal hernia. The esophagus is diffusely mildly fluid-filled suggesting changes of gastroesophageal reflux. Lungs/Pleura: Lungs are clear. No pleural effusion or pneumothorax. Central airways are widely patent. Musculoskeletal: No acute bone abnormality Review of the MIP images confirms the above findings. CTA ABDOMEN AND PELVIS FINDINGS VASCULAR Aorta: Normal caliber. No aneurysm or dissection. Moderate mixed atherosclerotic plaque. No periaortic inflammatory changes. Celiac: Widely patent.  Normal anatomic configuration. SMA: Widely patent.  No aneurysm or dissection. Renals: Single renal arteries are widely patent bilaterally and demonstrate normal vascular  morphology. No aneurysm. IMA: Widely patent. Inflow: Mild mixed atherosclerotic plaque within the proximal lower  extremity arterial inflow bilaterally without evidence of hemodynamically significant stenosis. No aneurysm or dissection. Internal iliac arteries are patent bilaterally. Veins: Morphologically normal. Review of the MIP images confirms the above findings. NON-VASCULAR Hepatobiliary: The gallbladder is distended and there is pericholecystic inflammatory change identified in keeping with changes of acute cholecystitis. Liver unremarkable. No intra or extrahepatic biliary ductal dilation. Pancreas: Unremarkable Spleen: Unremarkable Adrenals/Urinary Tract: Adrenal glands are unremarkable. Kidneys are normal, without renal calculi, focal lesion, or hydronephrosis. Bladder is unremarkable. Stomach/Bowel: The stomach, small bowel, and large bowel are unremarkable. Appendectomy has been performed. No free intraperitoneal gas or fluid. Lymphatic: No pathologic adenopathy within the abdomen and pelvis. Reproductive: Prostate is unremarkable. Other: No abdominal wall hernia. Musculoskeletal: No acute bone abnormality. Review of the MIP images confirms the above findings. IMPRESSION: No evidence of thoracoabdominal aortic aneurysm or dissection. Moderate atherosclerotic plaque within the abdominal aorta. Acute cholecystitis. Fluid-filled esophagus likely reflecting changes of gastroesophageal reflux. Small hiatal hernia. Aortic Atherosclerosis (ICD10-I70.0). Electronically Signed   By: Helyn Numbers MD   On: 11/06/2020 06:04   US Abdomen Limited RUQ (LIVER/GB)  Result Date: 11/06/2020 CLINICAL DATA:  Diffuse abdominal pain EXAM: ULTRASOUND ABDOMEN LIMITED RIGHT UPPER QUADRANT COMPARISON:  None. FINDINGS: Gallbladder: The gallbladder is distended, contains layering sludge, and demonstrates mild pericholecystic fluid best seen along the a hepatic wall. Together, the findings are in keeping with changes of acute cholecystitis. The sonographic Eulah Pont sign is equivocal due to pain medication administration immediately prior to  this examination. Common bile duct: Diameter: 2 mm in proximal diameter. Liver: No focal lesion identified. Within normal limits in parenchymal echogenicity. Portal vein is patent on color Doppler imaging with normal direction of blood flow towards the liver. Other: None. IMPRESSION: Acute cholecystitis. Electronically Signed   By: Helyn Numbers MD   On: 11/06/2020 05:54    Anti-infectives: Anti-infectives (From admission, onward)   Start     Dose/Rate Route Frequency Ordered Stop   11/07/20 0600  cefTRIAXone (ROCEPHIN) 2 g in sodium chloride 0.9 % 100 mL IVPB        2 g 200 mL/hr over 30 Minutes Intravenous Every 24 hours 11/06/20 1001     11/06/20 0615  cefTRIAXone (ROCEPHIN) 2 g in sodium chloride 0.9 % 100 mL IVPB        2 g 200 mL/hr over 30 Minutes Intravenous  Once 11/06/20 7793 11/06/20 0703      Assessment/Plan: 60 year old male with likely acute cholecystitis CAD-on Plavix Hypertension Diabetes 1.plavix on hold.  Will plan on lap chole on Wed 2. con't abx   LOS: 1 day    Axel Filler 11/07/2020

## 2020-11-07 NOTE — Progress Notes (Signed)
PROGRESS NOTE    Alexander Calderon  NFA:[id number] DOB: 1960-12-20 DOA: 11/06/2020 PCP: Loyal Jacobson, MD    Brief Narrative: 60 year old male with history of CAD, type 2 diabetes, hypertension, hyperlipidemia admitted with abdominal pain found to have acute cholecystitis.  Surgery consulted. CT abdomen acute cholecystitis fluid-filled esophagus reflecting changes of GERD.  Assessment & Plan:   Active Problems:   Cholecystitis   #1 acute cholecystitis seen by general surgery planning for cholecystectomy on Wednesday morning since he has to be off of Plavix for at least 5 days. Continue Rocephin  #2 history of CAD with multiple stents on aspirin Plavix and statin at home.  Admitting physician spoke with cardiology and was okay to hold Plavix for 5 days and no need for heparin bridge.  #3 history of essential hypertension blood pressure stable  #4 history of type 2 diabetes A1c of 6.9  #5 hyperlipidemia hold Crestor and Zetia due to elevated LFTs.  #6 GERD-on ppi  #7 transaminitis-?  Unsure what is causing.  DC Tylenol and statins.  Check hepatitis panel.  Check INR coagulation studies.  AST 5379 from 36 ALT 4185 from 49 Total bilirubin 2.9 Right upper quadrant ultrasound shows only acute cholecystitis.  Estimated body mass index is 25.79 kg/m as calculated from the following:   Height as of this encounter: 5\' 5"  (1.651 m).   Weight as of this encounter: 70.3 kg.  DVT prophylaxis: Lovenox  code Status: Full code Family Communication: None at bedside  disposition Plan:  Status is: Inpatient  Dispo: The patient is from: Home              Anticipated d/c is to: Home              Patient currently is not medically stable to d/c.   Difficult to place patient na   Consultants: General surgery  Procedures: None Antimicrobials: Rocephin Subjective: Patient resting in bed very anxious to have the procedure done Denies any nausea complains of some abdominal  pain  Objective: Vitals:   11/06/20 1650 11/06/20 2000 11/06/20 2018 11/07/20 0630  BP: 111/68 114/64  109/65  Pulse: 90 96 86 86  Resp: (!) 22  18   Temp: 98.4 F (36.9 C) 98 F (36.7 C) 98 F (36.7 C) 98 F (36.7 C)  TempSrc: Oral Oral Oral Oral  SpO2: 94% 95%  95%  Weight:      Height:       No intake or output data in the 24 hours ending 11/07/20 1056 Filed Weights   11/06/20 0429 11/06/20 1158  Weight: 70.3 kg 70.3 kg    Examination:  General exam: Appears calm and comfortable  Respiratory system: Clear to auscultation. Respiratory effort normal. Cardiovascular system: S1 & S2 heard, RRR. No JVD, murmurs, rubs, gallops or clicks. No pedal edema. Gastrointestinal system: Abdomen is distended, soft and mild right upper quadrant tender. No organomegaly or masses felt. Normal bowel sounds heard.  Umbilical hernia Central nervous system: Alert and oriented. No focal neurological deficits. Extremities: Symmetric 5 x 5 power. Skin: No rashes, lesions or ulcers Psychiatry: Judgement and insight appear normal. Mood & affect appropriate.     Data Reviewed: I have personally reviewed following labs and imaging studies  CBC: Recent Labs  Lab 11/06/20 0444 11/06/20 1229 11/07/20 0353  WBC 10.1 6.5 5.1  NEUTROABS 8.0*  --   --   HGB 12.6* 12.2* 11.9*  HCT 38.0* 37.5* 36.9*  MCV 85.6 86.4 86.4  PLT 223  164 157   Basic Metabolic Panel: Recent Labs  Lab 11/06/20 0444 11/06/20 1229 11/07/20 0353  NA 139  --  137  K 3.9  --  4.5  CL 101  --  100  CO2 28  --  29  GLUCOSE 132*  --  183*  BUN 18  --  15  CREATININE 0.85 0.75 0.89  CALCIUM 8.8*  --  8.8*   GFR: Estimated Creatinine Clearance: 78.7 mL/min (by C-G formula based on SCr of 0.89 mg/dL). Liver Function Tests: Recent Labs  Lab 11/06/20 0444 11/07/20 0353  AST 36 5,379*  ALT 49* 4,185*  ALKPHOS 65 309*  BILITOT 0.3 2.9*  PROT 6.7 6.1*  ALBUMIN 4.1 3.6   No results for input(s): LIPASE, AMYLASE  in the last 168 hours. No results for input(s): AMMONIA in the last 168 hours. Coagulation Profile: No results for input(s): INR, PROTIME in the last 168 hours. Cardiac Enzymes: No results for input(s): CKTOTAL, CKMB, CKMBINDEX, TROPONINI in the last 168 hours. BNP (last 3 results) No results for input(s): PROBNP in the last 8760 hours. HbA1C: Recent Labs    11/06/20 1229  HGBA1C 6.9*   CBG: Recent Labs  Lab 11/06/20 1215 11/06/20 1647 11/06/20 2009 11/07/20 0807  GLUCAP 186* 176* 140* 137*   Lipid Profile: No results for input(s): CHOL, HDL, LDLCALC, TRIG, CHOLHDL, LDLDIRECT in the last 72 hours. Thyroid Function Tests: No results for input(s): TSH, T4TOTAL, FREET4, T3FREE, THYROIDAB in the last 72 hours. Anemia Panel: No results for input(s): VITAMINB12, FOLATE, FERRITIN, TIBC, IRON, RETICCTPCT in the last 72 hours. Sepsis Labs: No results for input(s): PROCALCITON, LATICACIDVEN in the last 168 hours.  Recent Results (from the past 240 hour(s))  Resp Panel by RT-PCR (Flu A&B, Covid) Nasopharyngeal Swab     Status: None   Collection Time: 11/06/20  6:25 AM   Specimen: Nasopharyngeal Swab; Nasopharyngeal(NP) swabs in vial transport medium  Result Value Ref Range Status   SARS Coronavirus 2 by RT PCR NEGATIVE NEGATIVE Final    Comment: (NOTE) SARS-CoV-2 target nucleic acids are NOT DETECTED.  The SARS-CoV-2 RNA is generally detectable in upper respiratory specimens during the acute phase of infection. The lowest concentration of SARS-CoV-2 viral copies this assay can detect is 138 copies/mL. A negative result does not preclude SARS-Cov-2 infection and should not be used as the sole basis for treatment or other patient management decisions. A negative result may occur with  improper specimen collection/handling, submission of specimen other than nasopharyngeal swab, presence of viral mutation(s) within the areas targeted by this assay, and inadequate number of  viral copies(<138 copies/mL). A negative result must be combined with clinical observations, patient history, and epidemiological information. The expected result is Negative.  Fact Sheet for Patients:  BloggerCourse.comhttps://www.fda.gov/media/152166/download  Fact Sheet for Healthcare Providers:  SeriousBroker.ithttps://www.fda.gov/media/152162/download  This test is no t yet approved or cleared by the [place] FDA and  has been authorized for detection and/or diagnosis of SARS-CoV-2 by FDA under an Emergency Use Authorization (EUA). This EUA will remain  in effect (meaning this test can be used) for the duration of the COVID-19 declaration under Section 564(b)(1) of the Act, 21 U.S.C.section 360bbb-3(b)(1), unless the authorization is terminated  or revoked sooner.       Influenza A by PCR NEGATIVE NEGATIVE Final   Influenza B by PCR NEGATIVE NEGATIVE Final    Comment: (NOTE) The Xpert Xpress SARS-CoV-2/FLU/RSV plus assay is intended as an aid in the diagnosis of influenza from  Nasopharyngeal swab specimens and should not be used as a sole basis for treatment. Nasal washings and aspirates are unacceptable for Xpert Xpress SARS-CoV-2/FLU/RSV testing.  Fact Sheet for Patients: BloggerCourse.com  Fact Sheet for Healthcare Providers: SeriousBroker.it  This test is not yet approved or cleared by the [place] FDA and has been authorized for detection and/or diagnosis of SARS-CoV-2 by FDA under an Emergency Use Authorization (EUA). This EUA will remain in effect (meaning this test can be used) for the duration of the COVID-19 declaration under Section 564(b)(1) of the Act, 21 U.S.C. section 360bbb-3(b)(1), unless the authorization is terminated or revoked.  Performed at [facility], [address], [place], [place] [address]          Radiology Studies: DG Chest Portable 1 View  Result Date: 11/06/2020 CLINICAL DATA:  Epigastric  pain EXAM: PORTABLE CHEST 1 VIEW COMPARISON:  01/17/2020 FINDINGS: The heart size and mediastinal contours are within normal limits. Both lungs are clear. The visualized skeletal structures are unremarkable. IMPRESSION: No active disease. Electronically Signed   By: Helyn Numbers MD   On: 11/06/2020 04:51   CT Angio Chest/Abd/Pel for Dissection W and/or Wo Contrast  Result Date: 11/06/2020 CLINICAL DATA:  Chest and abdominal pain EXAM: CT ANGIOGRAPHY CHEST, ABDOMEN AND PELVIS TECHNIQUE: Non-contrast CT of the chest was initially obtained. Multidetector CT imaging through the chest, abdomen and pelvis was performed using the standard protocol during bolus administration of intravenous contrast. Multiplanar reconstructed images and MIPs were obtained and reviewed to evaluate the vascular anatomy. CONTRAST:  OMNIPAQUE IOHEXOL 350 MG/ML SOLN COMPARISON:  06/25/2020 FINDINGS: CTA CHEST FINDINGS Cardiovascular: The thoracic aorta is normal in caliber. No evidence of intramural hematoma, dissection, or aneurysm. The arch vasculature demonstrates normal anatomic configuration and is widely patent proximally. No significant atherosclerotic plaque. Right coronary artery stenting has been performed. Global cardiac size within normal limits. No pericardial effusion. Central pulmonary arteries are of normal caliber. Mediastinum/Nodes: The thyroid is unremarkable. No pathologic thoracic adenopathy. Small hiatal hernia. The esophagus is diffusely mildly fluid-filled suggesting changes of gastroesophageal reflux. Lungs/Pleura: Lungs are clear. No pleural effusion or pneumothorax. Central airways are widely patent. Musculoskeletal: No acute bone abnormality Review of the MIP images confirms the above findings. CTA ABDOMEN AND PELVIS FINDINGS VASCULAR Aorta: Normal caliber. No aneurysm or dissection. Moderate mixed atherosclerotic plaque. No periaortic inflammatory changes. Celiac: Widely patent.  Normal anatomic  configuration. SMA: Widely patent.  No aneurysm or dissection. Renals: Single renal arteries are widely patent bilaterally and demonstrate normal vascular morphology. No aneurysm. IMA: Widely patent. Inflow: Mild mixed atherosclerotic plaque within the proximal lower extremity arterial inflow bilaterally without evidence of hemodynamically significant stenosis. No aneurysm or dissection. Internal iliac arteries are patent bilaterally. Veins: Morphologically normal. Review of the MIP images confirms the above findings. NON-VASCULAR Hepatobiliary: The gallbladder is distended and there is pericholecystic inflammatory change identified in keeping with changes of acute cholecystitis. Liver unremarkable. No intra or extrahepatic biliary ductal dilation. Pancreas: Unremarkable Spleen: Unremarkable Adrenals/Urinary Tract: Adrenal glands are unremarkable. Kidneys are normal, without renal calculi, focal lesion, or hydronephrosis. Bladder is unremarkable. Stomach/Bowel: The stomach, small bowel, and large bowel are unremarkable. Appendectomy has been performed. No free intraperitoneal gas or fluid. Lymphatic: No pathologic adenopathy within the abdomen and pelvis. Reproductive: Prostate is unremarkable. Other: No abdominal wall hernia. Musculoskeletal: No acute bone abnormality. Review of the MIP images confirms the above findings. IMPRESSION: No evidence of thoracoabdominal aortic aneurysm or dissection. Moderate atherosclerotic plaque  within the abdominal aorta. Acute cholecystitis. Fluid-filled esophagus likely reflecting changes of gastroesophageal reflux. Small hiatal hernia. Aortic Atherosclerosis (ICD10-I70.0). Electronically Signed   By: Helyn Numbers MD   On: 11/06/2020 06:04   US Abdomen Limited RUQ (LIVER/GB)  Result Date: 11/06/2020 CLINICAL DATA:  Diffuse abdominal pain EXAM: ULTRASOUND ABDOMEN LIMITED RIGHT UPPER QUADRANT COMPARISON:  None. FINDINGS: Gallbladder: The gallbladder is distended, contains  layering sludge, and demonstrates mild pericholecystic fluid best seen along the a hepatic wall. Together, the findings are in keeping with changes of acute cholecystitis. The sonographic Eulah Pont sign is equivocal due to pain medication administration immediately prior to this examination. Common bile duct: Diameter: 2 mm in proximal diameter. Liver: No focal lesion identified. Within normal limits in parenchymal echogenicity. Portal vein is patent on color Doppler imaging with normal direction of blood flow towards the liver. Other: None. IMPRESSION: Acute cholecystitis. Electronically Signed   By: Helyn Numbers MD   On: 11/06/2020 05:54        Scheduled Meds: . aspirin  81 mg Oral Daily  . buPROPion  450 mg Oral q morning  . docusate sodium  100 mg Oral BID  . enoxaparin (LOVENOX) injection  40 mg Subcutaneous Q24H  . ezetimibe  10 mg Oral Daily  . insulin aspart  0-15 Units Subcutaneous TID WC  . insulin aspart  0-5 Units Subcutaneous QHS  . mirtazapine  45 mg Oral QHS  . montelukast  10 mg Oral Daily  . pantoprazole  40 mg Oral Daily  . rosuvastatin  40 mg Oral QHS   Continuous Infusions: . cefTRIAXone (ROCEPHIN)  IV 2 g (11/07/20 0545)     LOS: 1 day   Alwyn Ren, MD [facility] 11/07/2020, 10:56 AM

## 2020-11-07 NOTE — Progress Notes (Signed)
Pt complains of worsening itching all over. MD notified, benadryl ordered and administered. Pt stated that his itching was completely resolved and that he was finally able to rest comfortably. [place]

## 2020-11-08 LAB — CBC
HCT: 36.9 % — ABNORMAL LOW (ref 39.0–52.0)
Hemoglobin: 12 g/dL — ABNORMAL LOW (ref 13.0–17.0)
MCH: 28.4 pg (ref 26.0–34.0)
MCHC: 32.5 g/dL (ref 30.0–36.0)
MCV: 87.2 fL (ref 80.0–100.0)
Platelets: 179 10*3/uL (ref 150–400)
RBC: 4.23 MIL/uL (ref 4.22–5.81)
RDW: 12.9 % (ref 11.5–15.5)
WBC: 4.3 10*3/uL (ref 4.0–10.5)
nRBC: 0 % (ref 0.0–0.2)

## 2020-11-08 LAB — COMPREHENSIVE METABOLIC PANEL
ALT: 2078 U/L — ABNORMAL HIGH (ref 0–44)
AST: 1034 U/L — ABNORMAL HIGH (ref 15–41)
Albumin: 3.6 g/dL (ref 3.5–5.0)
Alkaline Phosphatase: 398 U/L — ABNORMAL HIGH (ref 38–126)
Anion gap: 10 (ref 5–15)
BUN: 13 mg/dL (ref 6–20)
CO2: 25 mmol/L (ref 22–32)
Calcium: 8.9 mg/dL (ref 8.9–10.3)
Chloride: 103 mmol/L (ref 98–111)
Creatinine, Ser: 0.89 mg/dL (ref 0.61–1.24)
GFR, Estimated: >60 mL/min (ref >60)
Glucose, Bld: 130 mg/dL — ABNORMAL HIGH (ref 70–99)
Potassium: 3.6 mmol/L (ref 3.5–5.1)
Sodium: 138 mmol/L (ref 135–145)
Total Bilirubin: 1.9 mg/dL — ABNORMAL HIGH (ref 0.3–1.2)
Total Protein: 6.6 g/dL (ref 6.5–8.1)

## 2020-11-08 LAB — GLUCOSE, CAPILLARY
Glucose-Capillary: 128 mg/dL — ABNORMAL HIGH (ref 70–99)
Glucose-Capillary: 193 mg/dL — ABNORMAL HIGH (ref 70–99)
Glucose-Capillary: 135 mg/dL — ABNORMAL HIGH (ref 70–99)
Glucose-Capillary: 251 mg/dL — ABNORMAL HIGH (ref 70–99)

## 2020-11-08 MED ORDER — ALUM HYDROXIDE-MAG CARBONATE 160-105 MG PO CHEW: 1.0000 | CHEWABLE_TABLET | Freq: Three times a day (TID) | ORAL | Status: DC | PRN

## 2020-11-08 NOTE — Progress Notes (Signed)
PROGRESS NOTE    Alexander Calderon  QAS:[id number] DOB: [birth year] DOA: 11/06/2020 PCP: Loyal Jacobson, MD    Brief Narrative: 60 year old male with history of CAD, type 2 diabetes, hypertension, hyperlipidemia admitted with abdominal pain found to have acute cholecystitis.  Surgery consulted. CT abdomen acute cholecystitis fluid-filled esophagus reflecting changes of GERD.  Assessment & Plan:   Active Problems:   Cholecystitis   Pain   #1 acute cholecystitis seen by general surgery planning for cholecystectomy on 11/10/2020 Wednesday morning since he has to be off of Plavix for at least 5 days. Continue Rocephin  #2 history of CAD with multiple stents on aspirin Plavix and statin at home.  Admitting physician spoke with cardiology and was okay to hold Plavix for 5 days and no need for heparin bridge.  #3 history of essential hypertension blood pressure stable  #4 history of type 2 diabetes A1c of 6.9. CBG (last 3)  Recent Labs    11/07/20 2327 11/08/20 0739 11/08/20 1202  GLUCAP 146* 128* 193*     #5 hyperlipidemia hold Crestor and Zetia due to elevated LFTs.  #6 GERD-on ppi  #7 transaminitis-improving?  Unsure what is causing.  DCd Tylenol and statins.  Negative hepatitis panel.   INR 1.3.   .  AST 1034 from 5379 from 36 ALT 2078 from 4185 from 49 Total bilirubin 2.9 Right upper quadrant ultrasound shows only acute cholecystitis.  Estimated body mass index is 25.79 kg/m as calculated from the following:   Height as of this encounter: 5\' 5"  (1.651 m).   Weight as of this encounter: 70.3 kg.  DVT prophylaxis: Lovenox  code Status: Full code Family Communication: None at bedside  disposition Plan:  Status is: Inpatient  Dispo: The patient is from: Home              Anticipated d/c is to: Home              Patient currently is not medically stable to d/c.   Difficult to place patient na   Consultants: General surgery  Procedures: None Antimicrobials:  Rocephin Subjective: Resting in bed no nausea vomiting some abdominal discomfort reported  Objective: Vitals:   11/07/20 1242 11/07/20 2130 11/08/20 0523 11/08/20 1258  BP: 124/68 118/76 130/78 117/69  Pulse: 91 85 91 97  Resp: (!) 22  19 20   Temp: 98.9 F (37.2 C) 98.9 F (37.2 C) 98 F (36.7 C) 98.9 F (37.2 C)  TempSrc: Oral Oral Oral Oral  SpO2: 97% 98% 96% 98%  Weight:      Height:        Intake/Output Summary (Last 24 hours) at 11/08/2020 1340 Last data filed at 11/08/2020 0555 Gross per 24 hour  Intake 200.94 ml  Output --  Net 200.94 ml   Filed Weights   11/06/20 0429 11/06/20 1158  Weight: 70.3 kg 70.3 kg    Examination:  General exam: Appears calm and comfortable  Respiratory system: Clear to auscultation. Respiratory effort normal. Cardiovascular system: S1 & S2 heard, RRR. No JVD, murmurs, rubs, gallops or clicks. No pedal edema. Gastrointestinal system: Abdomen is distended, soft and mild right upper quadrant tender. No organomegaly or masses felt. Normal bowel sounds heard.  Umbilical hernia Central nervous system: Alert and oriented. No focal neurological deficits. Extremities: Symmetric 5 x 5 power. Skin: No rashes, lesions or ulcers Psychiatry: Judgement and insight appear normal. Mood & affect appropriate.     Data Reviewed: I have personally reviewed following labs and imaging studies  CBC: Recent Labs  Lab 11/06/20 0444 11/06/20 1229 11/07/20 0353 11/08/20 0433  WBC 10.1 6.5 5.1 4.3  NEUTROABS 8.0*  --   --   --   HGB 12.6* 12.2* 11.9* 12.0*  HCT 38.0* 37.5* 36.9* 36.9*  MCV 85.6 86.4 86.4 87.2  PLT 223 164 157 179   Basic Metabolic Panel: Recent Labs  Lab 11/06/20 0444 11/06/20 1229 11/07/20 0353 11/08/20 0433  NA 139  --  137 138  K 3.9  --  4.5 3.6  CL 101  --  100 103  CO2 28  --  29 25  GLUCOSE 132*  --  183* 130*  BUN 18  --  15 13  CREATININE 0.85 0.75 0.89 0.89  CALCIUM 8.8*  --  8.8* 8.9   GFR: Estimated  Creatinine Clearance: 78.7 mL/min (by C-G formula based on SCr of 0.89 mg/dL). Liver Function Tests: Recent Labs  Lab 11/06/20 0444 11/07/20 0353 11/08/20 0433  AST 36 5,379* 1,034*  ALT 49* 4,185* 2,078*  ALKPHOS 65 309* 398*  BILITOT 0.3 2.9* 1.9*  PROT 6.7 6.1* 6.6  ALBUMIN 4.1 3.6 3.6   No results for input(s): LIPASE, AMYLASE in the last 168 hours. Recent Labs  Lab 11/07/20 1153  AMMONIA 27   Coagulation Profile: Recent Labs  Lab 11/07/20 1153  INR 1.3*   Cardiac Enzymes: No results for input(s): CKTOTAL, CKMB, CKMBINDEX, TROPONINI in the last 168 hours. BNP (last 3 results) No results for input(s): PROBNP in the last 8760 hours. HbA1C: Recent Labs    11/06/20 1229  HGBA1C 6.9*   CBG: Recent Labs  Lab 11/07/20 1550 11/07/20 1758 11/07/20 2327 11/08/20 0739 11/08/20 1202  GLUCAP 161* 159* 146* 128* 193*   Lipid Profile: No results for input(s): CHOL, HDL, LDLCALC, TRIG, CHOLHDL, LDLDIRECT in the last 72 hours. Thyroid Function Tests: No results for input(s): TSH, T4TOTAL, FREET4, T3FREE, THYROIDAB in the last 72 hours. Anemia Panel: No results for input(s): VITAMINB12, FOLATE, FERRITIN, TIBC, IRON, RETICCTPCT in the last 72 hours. Sepsis Labs: No results for input(s): PROCALCITON, LATICACIDVEN in the last 168 hours.  Recent Results (from the past 240 hour(s))  Resp Panel by RT-PCR (Flu A&B, Covid) Nasopharyngeal Swab     Status: None   Collection Time: 11/06/20  6:25 AM   Specimen: Nasopharyngeal Swab; Nasopharyngeal(NP) swabs in vial transport medium  Result Value Ref Range Status   SARS Coronavirus 2 by RT PCR NEGATIVE NEGATIVE Final    Comment: (NOTE) SARS-CoV-2 target nucleic acids are NOT DETECTED.  The SARS-CoV-2 RNA is generally detectable in upper respiratory specimens during the acute phase of infection. The lowest concentration of SARS-CoV-2 viral copies this assay can detect is 138 copies/mL. A negative result does not preclude  SARS-Cov-2 infection and should not be used as the sole basis for treatment or other patient management decisions. A negative result may occur with  improper specimen collection/handling, submission of specimen other than nasopharyngeal swab, presence of viral mutation(s) within the areas targeted by this assay, and inadequate number of viral copies(<138 copies/mL). A negative result must be combined with clinical observations, patient history, and epidemiological information. The expected result is Negative.  Fact Sheet for Patients:  BloggerCourse.com  Fact Sheet for Healthcare Providers:  SeriousBroker.it  This test is no t yet approved or cleared by the [place] FDA and  has been authorized for detection and/or diagnosis of SARS-CoV-2 by FDA under an Emergency Use Authorization (EUA). This EUA will remain  in effect (  meaning this test can be used) for the duration of the COVID-19 declaration under Section 564(b)(1) of the Act, 21 U.S.C.section 360bbb-3(b)(1), unless the authorization is terminated  or revoked sooner.       Influenza A by PCR NEGATIVE NEGATIVE Final   Influenza B by PCR NEGATIVE NEGATIVE Final    Comment: (NOTE) The Xpert Xpress SARS-CoV-2/FLU/RSV plus assay is intended as an aid in the diagnosis of influenza from Nasopharyngeal swab specimens and should not be used as a sole basis for treatment. Nasal washings and aspirates are unacceptable for Xpert Xpress SARS-CoV-2/FLU/RSV testing.  Fact Sheet for Patients: BloggerCourse.com  Fact Sheet for Healthcare Providers: SeriousBroker.it  This test is not yet approved or cleared by the [place] FDA and has been authorized for detection and/or diagnosis of SARS-CoV-2 by FDA under an Emergency Use Authorization (EUA). This EUA will remain in effect (meaning this test can be used) for the duration of  the COVID-19 declaration under Section 564(b)(1) of the Act, 21 U.S.C. section 360bbb-3(b)(1), unless the authorization is terminated or revoked.  Performed at [facility], [address], [place], [place] [address]          Radiology Studies: No results found.      Scheduled Meds: . aspirin  81 mg Oral Daily  . buPROPion  450 mg Oral q morning  . docusate sodium  100 mg Oral BID  . enoxaparin (LOVENOX) injection  40 mg Subcutaneous Q24H  . insulin aspart  0-15 Units Subcutaneous TID WC  . insulin aspart  0-5 Units Subcutaneous QHS  . mirtazapine  45 mg Oral QHS  . montelukast  10 mg Oral Daily  . pantoprazole  40 mg Oral Daily   Continuous Infusions: . cefTRIAXone (ROCEPHIN)  IV 2 g (11/08/20 0509)     LOS: 2 days   Alwyn Ren, MD [facility] 11/08/2020, 1:40 PM

## 2020-11-08 NOTE — Progress Notes (Signed)
   Subjective/Chief Complaint: Denies abd pain or nausea. Tolerating PO.   Objective: Vital signs in last 24 hours: Temp:  [98 F (36.7 C)-98.9 F (37.2 C)] 98 F (36.7 C) (03/07 0523) Pulse Rate:  [85-91] 91 (03/07 0523) Resp:  [19-22] 19 (03/07 0523) BP: (118-130)/(68-78) 130/78 (03/07 0523) SpO2:  [96 %-98 %] 96 % (03/07 0523) Last BM Date: 11/05/20  Intake/Output from previous day: 03/06 0701 - 03/07 0700 In: 200.9 [IV Piggyback:200.9] Out: -  Intake/Output this shift: No intake/output data recorded.  PE:  Constitutional: No acute distress, conversant, appears states age. Eyes: Anicteric sclerae, moist conjunctiva, no lid lag Lungs: Clear to auscultation bilaterally, normal respiratory effort CV: regular rate and rhythm, no murmurs, no peripheral edema GI: Soft, no masses or hepatosplenomegaly, non-tender to palpation, diastasis recti present. Skin: No rashes, palpation reveals normal turgor Psychiatric: appropriate judgment and insight, oriented to person, place, and time   Lab Results:  Recent Labs    11/07/20 0353 11/08/20 0433  WBC 5.1 4.3  HGB 11.9* 12.0*  HCT 36.9* 36.9*  PLT 157 179   BMET Recent Labs    11/07/20 0353 11/08/20 0433  NA 137 138  K 4.5 3.6  CL 100 103  CO2 29 25  GLUCOSE 183* 130*  BUN 15 13  CREATININE 0.89 0.89  CALCIUM 8.8* 8.9   PT/INR Recent Labs    11/07/20 1153  LABPROT 15.3*  INR 1.3*    No results found.  Anti-infectives: Anti-infectives (From admission, onward)   Start     Dose/Rate Route Frequency Ordered Stop   11/07/20 0600  cefTRIAXone (ROCEPHIN) 2 g in sodium chloride 0.9 % 100 mL IVPB        2 g 200 mL/hr over 30 Minutes Intravenous Every 24 hours 11/06/20 1001     11/06/20 0615  cefTRIAXone (ROCEPHIN) 2 g in sodium chloride 0.9 % 100 mL IVPB        2 g 200 mL/hr over 30 Minutes Intravenous  Once 11/06/20 1540 11/06/20 0703      Assessment/Plan: 60 year old male with likely acute  cholecystitis CAD-on Plavix Hypertension Diabetes 1.plavix on hold.  Will plan on lap chole on Wed 3/9 2. con't abx   LOS: 2 days    Adam Phenix 11/08/2020

## 2020-11-09 DIAGNOSIS — K819 Cholecystitis, unspecified: Secondary | ICD-10-CM

## 2020-11-09 LAB — CBC
HCT: 38 % — ABNORMAL LOW (ref 39.0–52.0)
Hemoglobin: 12.6 g/dL — ABNORMAL LOW (ref 13.0–17.0)
MCH: 28.4 pg (ref 26.0–34.0)
MCHC: 33.2 g/dL (ref 30.0–36.0)
MCV: 85.6 fL (ref 80.0–100.0)
Platelets: 199 10*3/uL (ref 150–400)
RBC: 4.44 MIL/uL (ref 4.22–5.81)
RDW: 12.8 % (ref 11.5–15.5)
WBC: 5.7 10*3/uL (ref 4.0–10.5)
nRBC: 0 % (ref 0.0–0.2)

## 2020-11-09 LAB — COMPREHENSIVE METABOLIC PANEL
ALT: 1317 U/L — ABNORMAL HIGH (ref 0–44)
AST: 220 U/L — ABNORMAL HIGH (ref 15–41)
Albumin: 3.7 g/dL (ref 3.5–5.0)
Alkaline Phosphatase: 405 U/L — ABNORMAL HIGH (ref 38–126)
Anion gap: 7 (ref 5–15)
BUN: 18 mg/dL (ref 6–20)
CO2: 27 mmol/L (ref 22–32)
Calcium: 9.1 mg/dL (ref 8.9–10.3)
Chloride: 104 mmol/L (ref 98–111)
Creatinine, Ser: 0.93 mg/dL (ref 0.61–1.24)
GFR, Estimated: >60 mL/min (ref >60)
Glucose, Bld: 159 mg/dL — ABNORMAL HIGH (ref 70–99)
Potassium: 4 mmol/L (ref 3.5–5.1)
Sodium: 138 mmol/L (ref 135–145)
Total Bilirubin: 1.2 mg/dL (ref 0.3–1.2)
Total Protein: 6.6 g/dL (ref 6.5–8.1)

## 2020-11-09 LAB — GLUCOSE, CAPILLARY
Glucose-Capillary: 148 mg/dL — ABNORMAL HIGH (ref 70–99)
Glucose-Capillary: 341 mg/dL — ABNORMAL HIGH (ref 70–99)
Glucose-Capillary: 111 mg/dL — ABNORMAL HIGH (ref 70–99)
Glucose-Capillary: 218 mg/dL — ABNORMAL HIGH (ref 70–99)

## 2020-11-09 LAB — SURGICAL PCR SCREEN
MRSA, PCR: NEGATIVE
Staphylococcus aureus: NEGATIVE

## 2020-11-09 MED ORDER — INSULIN ASPART 100 UNIT/ML ~~LOC~~ SOLN
0.0000 [IU] | Freq: Three times a day (TID) | SUBCUTANEOUS | Status: DC
  Administered 2020-11-10 (×2): 3 [IU] via SUBCUTANEOUS
  Administered 2020-11-11: 4 [IU] via SUBCUTANEOUS
  Administered 2020-11-11 – 2020-11-12 (×2): 3 [IU] via SUBCUTANEOUS
  Administered 2020-11-12 (×2): 7 [IU] via SUBCUTANEOUS
  Administered 2020-11-13: 3 [IU] via SUBCUTANEOUS
  Administered 2020-11-13: 7 [IU] via SUBCUTANEOUS
  Administered 2020-11-13: 3 [IU] via SUBCUTANEOUS
  Administered 2020-11-14: 7 [IU] via SUBCUTANEOUS
  Administered 2020-11-14 – 2020-11-15 (×2): 3 [IU] via SUBCUTANEOUS

## 2020-11-09 NOTE — Progress Notes (Signed)
Shift Summary: No pain needs, remained alert and oriented x 4.Called to room. Patient unable to order meal tray. NPO diet entered early to start at 1330 instead of 0000 3/9. Patient unable to order tray. Modified order and will make night RN aware that patient will need to be made NPO at midnight for pending surgery tommorow. Lovenox held due to pending surgery tomorrow.Insulin regimen adjusted by MD due to high fsbs. No other needs identified. Will continue to monitor.

## 2020-11-09 NOTE — Progress Notes (Signed)
   Subjective/Chief Complaint: No new complaints. Tolerating PO. Wife at bedside.   Objective: Vital signs in last 24 hours: Temp:  [97.7 F (36.5 C)-98.3 F (36.8 C)] 98.2 F (36.8 C) (03/08 1319) Pulse Rate:  [82-90] 84 (03/08 1319) Resp:  [16-19] 16 (03/08 1319) BP: (113-149)/(59-88) 139/86 (03/08 1319) SpO2:  [97 %-99 %] 97 % (03/08 1319) Last BM Date: 11/05/20  Intake/Output from previous day: No intake/output data recorded. Intake/Output this shift: No intake/output data recorded.  PE:  Constitutional: No acute distress, conversant, appears states age. Eyes: Anicteric sclerae, moist conjunctiva, no lid lag Lungs: Clear to auscultation bilaterally, normal respiratory effort CV: regular rate and rhythm, no murmurs, no peripheral edema GI: Soft, no masses or hepatosplenomegaly, non-tender to palpation, diastasis recti present. Skin: No rashes, palpation reveals normal turgor Psychiatric: appropriate judgment and insight, oriented to person, place, and time   Lab Results:  Recent Labs    11/08/20 0433 11/09/20 0444  WBC 4.3 5.7  HGB 12.0* 12.6*  HCT 36.9* 38.0*  PLT 179 199   BMET Recent Labs    11/08/20 0433 11/09/20 0444  NA 138 138  K 3.6 4.0  CL 103 104  CO2 25 27  GLUCOSE 130* 159*  BUN 13 18  CREATININE 0.89 0.93  CALCIUM 8.9 9.1   PT/INR Recent Labs    11/07/20 1153  LABPROT 15.3*  INR 1.3*    No results found.  Anti-infectives: Anti-infectives (From admission, onward)   Start     Dose/Rate Route Frequency Ordered Stop   11/07/20 0600  cefTRIAXone (ROCEPHIN) 2 g in sodium chloride 0.9 % 100 mL IVPB        2 g 200 mL/hr over 30 Minutes Intravenous Every 24 hours 11/06/20 1001     11/06/20 0615  cefTRIAXone (ROCEPHIN) 2 g in sodium chloride 0.9 % 100 mL IVPB        2 g 200 mL/hr over 30 Minutes Intravenous  Once 11/06/20 0613 11/06/20 0703      Assessment/Plan: CAD-on Plavix, held  Hypertension Diabetes  60-year-old male with  likely acute calculcous cholecystitis plavix on hold.  Will plan on lap chole with possible IOC tomorrow 3/9  con't abx NPO after MN   LOS: 3 days    Alexander Calderon 11/09/2020 

## 2020-11-09 NOTE — H&P (View-Only) (Signed)
   Subjective/Chief Complaint: No new complaints. Tolerating PO. Wife at bedside.   Objective: Vital signs in last 24 hours: Temp:  [97.7 F (36.5 C)-98.3 F (36.8 C)] 98.2 F (36.8 C) (03/08 1319) Pulse Rate:  [82-90] 84 (03/08 1319) Resp:  [16-19] 16 (03/08 1319) BP: (113-149)/(59-88) 139/86 (03/08 1319) SpO2:  [97 %-99 %] 97 % (03/08 1319) Last BM Date: 11/05/20  Intake/Output from previous day: No intake/output data recorded. Intake/Output this shift: No intake/output data recorded.  PE:  Constitutional: No acute distress, conversant, appears states age. Eyes: Anicteric sclerae, moist conjunctiva, no lid lag Lungs: Clear to auscultation bilaterally, normal respiratory effort CV: regular rate and rhythm, no murmurs, no peripheral edema GI: Soft, no masses or hepatosplenomegaly, non-tender to palpation, diastasis recti present. Skin: No rashes, palpation reveals normal turgor Psychiatric: appropriate judgment and insight, oriented to person, place, and time   Lab Results:  Recent Labs    11/08/20 0433 11/09/20 0444  WBC 4.3 5.7  HGB 12.0* 12.6*  HCT 36.9* 38.0*  PLT 179 199   BMET Recent Labs    11/08/20 0433 11/09/20 0444  NA 138 138  K 3.6 4.0  CL 103 104  CO2 25 27  GLUCOSE 130* 159*  BUN 13 18  CREATININE 0.89 0.93  CALCIUM 8.9 9.1   PT/INR Recent Labs    11/07/20 1153  LABPROT 15.3*  INR 1.3*    No results found.  Anti-infectives: Anti-infectives (From admission, onward)   Start     Dose/Rate Route Frequency Ordered Stop   11/07/20 0600  cefTRIAXone (ROCEPHIN) 2 g in sodium chloride 0.9 % 100 mL IVPB        2 g 200 mL/hr over 30 Minutes Intravenous Every 24 hours 11/06/20 1001     11/06/20 0615  cefTRIAXone (ROCEPHIN) 2 g in sodium chloride 0.9 % 100 mL IVPB        2 g 200 mL/hr over 30 Minutes Intravenous  Once 11/06/20 6063 11/06/20 0703      Assessment/Plan: CAD-on Plavix, held  Hypertension Diabetes  60 year old male with  likely acute calculcous cholecystitis plavix on hold.  Will plan on lap chole with possible IOC tomorrow 3/9  con't abx NPO after MN   LOS: 3 days    Adam Phenix 11/09/2020

## 2020-11-09 NOTE — Progress Notes (Signed)
PROGRESS NOTE    Alexander Calderon  EYC:[id number] DOB: 09-14-1960 DOA: 11/06/2020 PCP: Alexander Jacobson, MD    Chief Complaint  Patient presents with  . Abdominal Pain    Brief Narrative:  60 year old gentleman prior history of coronary artery disease on aspirin, Plavix, type 2 diabetes, hypertension, hyperlipidemia admitted for abdominal pain he was found to have acute cholecystitis. General surgery consulted and he scheduled for lap cholecystectomy on 11/10/2020.  Assessment & Plan:   Active Problems:   Cholecystitis   Pain   Acute cholecystitis Plan for lap cholecystectomy on 11/10/2020, to be off Plavix for at least 5 days. Continue symptomatic management with antiemetics, IV fluids and IV antibiotics. Patient currently reports no nausea vomiting or abdominal pain at this time. Improvement in liver enzymes.    History of coronary artery disease s/p multiple stents Patient at home on aspirin and Plavix, statin. Plan to hold her Plavix for 5 days and no indication for heparin bridge. Statin on hold for elevated liver enzymes.   Essential hypertension Blood pressure parameters appear to be optimal at this time.    Type 2 diabetes mellitus CBG (last 3)  Recent Labs    11/08/20 2035 11/09/20 0725 11/09/20 1115  GLUCAP 251* 148* 341*   CBGs not well controlled, A1c at 6.9.   Hyperlipidemia Holding statins for elevated liver enzymes.    GERD Stable on PPI.        DVT prophylaxis: (Lovenox) Code Status: (Full code) Family Communication: none at bedside.  Disposition:   Status is: Inpatient  Remains inpatient appropriate because:Ongoing diagnostic testing needed not appropriate for outpatient work up and IV treatments appropriate due to intensity of illness or inability to take PO   Dispo: The patient is from: Home              Anticipated d/c is to: Home              Patient currently is not medically stable to d/c.   Difficult to place patient  No       Consultants:   Surgery.    Procedures:  Scheduled for lap chol on 3/9  Antimicrobials:  Antibiotics Given (last 72 hours)    Date/Time Action Medication Dose Rate   11/07/20 0545 New Bag/Given   cefTRIAXone (ROCEPHIN) 2 g in sodium chloride 0.9 % 100 mL IVPB 2 g 200 mL/hr   11/08/20 0509 New Bag/Given   cefTRIAXone (ROCEPHIN) 2 g in sodium chloride 0.9 % 100 mL IVPB 2 g 200 mL/hr   11/09/20 0602 New Bag/Given   cefTRIAXone (ROCEPHIN) 2 g in sodium chloride 0.9 % 100 mL IVPB 2 g 200 mL/hr          Subjective: No nausea, vomiting .   Objective: Vitals:   11/08/20 1258 11/08/20 2037 11/09/20 0529 11/09/20 1319  BP: 117/69 (!) 113/59 (!) 149/88 139/86  Pulse: 97 90 82 84  Resp: 20 19  16   Temp: 98.9 F (37.2 C) 98.3 F (36.8 C) 97.7 F (36.5 C) 98.2 F (36.8 C)  TempSrc: Oral Oral Oral Oral  SpO2: 98% 97% 99% 97%  Weight:      Height:       No intake or output data in the 24 hours ending 11/09/20 1320 Filed Weights   11/06/20 0429 11/06/20 1158  Weight: 70.3 kg 70.3 kg    Examination:  General exam: Appears calm and comfortable  Respiratory system: Clear to auscultation. Respiratory effort normal. Cardiovascular system: S1 & S2  heard, RRR. No JVD,No pedal edema. Gastrointestinal system: Abdomen is nondistended, soft and nontender. Normal bowel sounds heard. Central nervous system: Alert and oriented. No focal neurological deficits. Extremities: Symmetric 5 x 5 power. Skin: No rashes, lesions or ulcers Psychiatry: Mood & affect appropriate.     Data Reviewed: I have personally reviewed following labs and imaging studies  CBC: Recent Labs  Lab 11/06/20 0444 11/06/20 1229 11/07/20 0353 11/08/20 0433 11/09/20 0444  WBC 10.1 6.5 5.1 4.3 5.7  NEUTROABS 8.0*  --   --   --   --   HGB 12.6* 12.2* 11.9* 12.0* 12.6*  HCT 38.0* 37.5* 36.9* 36.9* 38.0*  MCV 85.6 86.4 86.4 87.2 85.6  PLT 223 164 157 179 199    Basic Metabolic Panel: Recent  Labs  Lab 11/06/20 0444 11/06/20 1229 11/07/20 0353 11/08/20 0433 11/09/20 0444  NA 139  --  137 138 138  K 3.9  --  4.5 3.6 4.0  CL 101  --  100 103 104  CO2 28  --  29 25 27   GLUCOSE 132*  --  183* 130* 159*  BUN 18  --  15 13 18   CREATININE 0.85 0.75 0.89 0.89 0.93  CALCIUM 8.8*  --  8.8* 8.9 9.1    GFR: Estimated Creatinine Clearance: 75.3 mL/min (by C-G formula based on SCr of 0.93 mg/dL).  Liver Function Tests: Recent Labs  Lab 11/06/20 0444 11/07/20 0353 11/08/20 0433 11/09/20 0444  AST 36 5,379* 1,034* 220*  ALT 49* 4,185* 2,078* 1,317*  ALKPHOS 65 309* 398* 405*  BILITOT 0.3 2.9* 1.9* 1.2  PROT 6.7 6.1* 6.6 6.6  ALBUMIN 4.1 3.6 3.6 3.7    CBG: Recent Labs  Lab 11/08/20 1202 11/08/20 1652 11/08/20 2035 11/09/20 0725 11/09/20 1115  GLUCAP 193* 135* 251* 148* 341*     Recent Results (from the past 240 hour(s))  Resp Panel by RT-PCR (Flu A&B, Covid) Nasopharyngeal Swab     Status: None   Collection Time: 11/06/20  6:25 AM   Specimen: Nasopharyngeal Swab; Nasopharyngeal(NP) swabs in vial transport medium  Result Value Ref Range Status   SARS Coronavirus 2 by RT PCR NEGATIVE NEGATIVE Final    Comment: (NOTE) SARS-CoV-2 target nucleic acids are NOT DETECTED.  The SARS-CoV-2 RNA is generally detectable in upper respiratory specimens during the acute phase of infection. The lowest concentration of SARS-CoV-2 viral copies this assay can detect is 138 copies/mL. A negative result does not preclude SARS-Cov-2 infection and should not be used as the sole basis for treatment or other patient management decisions. A negative result may occur with  improper specimen collection/handling, submission of specimen other than nasopharyngeal swab, presence of viral mutation(s) within the areas targeted by this assay, and inadequate number of viral copies(<138 copies/mL). A negative result must be combined with clinical observations, patient history, and  epidemiological information. The expected result is Negative.  Fact Sheet for Patients:  01/09/21  Fact Sheet for Healthcare Providers:  01/06/21  This test is no t yet approved or cleared by the [employer] FDA and  has been authorized for detection and/or diagnosis of SARS-CoV-2 by FDA under an Emergency Use Authorization (EUA). This EUA will remain  in effect (meaning this test can be used) for the duration of the COVID-19 declaration under Section 564(b)(1) of the Act, 21 U.S.C.section 360bbb-3(b)(1), unless the authorization is terminated  or revoked sooner.       Influenza A by PCR NEGATIVE NEGATIVE Final   Influenza  B by PCR NEGATIVE NEGATIVE Final    Comment: (NOTE) The Xpert Xpress SARS-CoV-2/FLU/RSV plus assay is intended as an aid in the diagnosis of influenza from Nasopharyngeal swab specimens and should not be used as a sole basis for treatment. Nasal washings and aspirates are unacceptable for Xpert Xpress SARS-CoV-2/FLU/RSV testing.  Fact Sheet for Patients: BloggerCourse.com  Fact Sheet for Healthcare Providers: SeriousBroker.it  This test is not yet approved or cleared by the [place] FDA and has been authorized for detection and/or diagnosis of SARS-CoV-2 by FDA under an Emergency Use Authorization (EUA). This EUA will remain in effect (meaning this test can be used) for the duration of the COVID-19 declaration under Section 564(b)(1) of the Act, 21 U.S.C. section 360bbb-3(b)(1), unless the authorization is terminated or revoked.  Performed at [facility], [address] [place], [place] [address]   Surgical pcr screen     Status: None   Collection Time: 11/09/20  6:03 AM   Specimen: Nasal Mucosa; Nasal Swab  Result Value Ref Range Status   MRSA, PCR NEGATIVE NEGATIVE Final   Staphylococcus aureus NEGATIVE NEGATIVE  Final    Comment: (NOTE) The Xpert SA Assay (FDA approved for NASAL specimens in patients 80 years of age and older), is one component of a comprehensive surveillance program. It is not intended to diagnose infection nor to guide or monitor treatment. Performed at [facility], [address], [place], [place] [address]          Radiology Studies: No results found.      Scheduled Meds: . aspirin  81 mg Oral Daily  . buPROPion  450 mg Oral q morning  . docusate sodium  100 mg Oral BID  . enoxaparin (LOVENOX) injection  40 mg Subcutaneous Q24H  . insulin aspart  0-15 Units Subcutaneous TID WC  . insulin aspart  0-5 Units Subcutaneous QHS  . mirtazapine  45 mg Oral QHS  . montelukast  10 mg Oral Daily  . pantoprazole  40 mg Oral Daily   Continuous Infusions: . cefTRIAXone (ROCEPHIN)  IV 2 g (11/09/20 0602)     LOS: 3 days        Kathlen Mody, MD [facility]   To contact the attending provider between 7A-7P or the covering provider during after hours 7P-7A, please log into the web site [email or web address] and access using universal [place] password for that web site. If you do not have the password, please call the hospital operator.  11/09/2020, 1:20 PM

## 2020-11-10 LAB — CBC
HCT: 41.1 % (ref 39.0–52.0)
Hemoglobin: 13.1 g/dL (ref 13.0–17.0)
MCH: 27.5 pg (ref 26.0–34.0)
MCHC: 31.9 g/dL (ref 30.0–36.0)
MCV: 86.3 fL (ref 80.0–100.0)
Platelets: 236 10*3/uL (ref 150–400)
RBC: 4.76 MIL/uL (ref 4.22–5.81)
RDW: 13.1 % (ref 11.5–15.5)
WBC: 6.3 10*3/uL (ref 4.0–10.5)
nRBC: 0 % (ref 0.0–0.2)

## 2020-11-10 LAB — COMPREHENSIVE METABOLIC PANEL
ALT: 855 U/L — ABNORMAL HIGH (ref 0–44)
AST: 72 U/L — ABNORMAL HIGH (ref 15–41)
Albumin: 3.7 g/dL (ref 3.5–5.0)
Alkaline Phosphatase: 364 U/L — ABNORMAL HIGH (ref 38–126)
Anion gap: 12 (ref 5–15)
BUN: 19 mg/dL (ref 6–20)
CO2: 24 mmol/L (ref 22–32)
Calcium: 9.4 mg/dL (ref 8.9–10.3)
Chloride: 104 mmol/L (ref 98–111)
Creatinine, Ser: 1.02 mg/dL (ref 0.61–1.24)
GFR, Estimated: >60 mL/min (ref >60)
Glucose, Bld: 120 mg/dL — ABNORMAL HIGH (ref 70–99)
Potassium: 4 mmol/L (ref 3.5–5.1)
Sodium: 140 mmol/L (ref 135–145)
Total Bilirubin: 1 mg/dL (ref 0.3–1.2)
Total Protein: 6.8 g/dL (ref 6.5–8.1)

## 2020-11-10 LAB — GLUCOSE, CAPILLARY
Glucose-Capillary: 132 mg/dL — ABNORMAL HIGH (ref 70–99)
Glucose-Capillary: 129 mg/dL — ABNORMAL HIGH (ref 70–99)
Glucose-Capillary: 101 mg/dL — ABNORMAL HIGH (ref 70–99)
Glucose-Capillary: 264 mg/dL — ABNORMAL HIGH (ref 70–99)

## 2020-11-10 NOTE — Progress Notes (Signed)
Report given to short stay RN. Awaiting patient to be picked up for procedure.

## 2020-11-10 NOTE — Plan of Care (Signed)
?  Problem: Health Behavior/Discharge Planning: ?Goal: Ability to manage health-related needs will improve ?Outcome: Progressing ?  ?Problem: Clinical Measurements: ?Goal: Diagnostic test results will improve ?Outcome: Progressing ?  ?Problem: Pain Managment: ?Goal: General experience of comfort will improve ?Outcome: Progressing ?  ?Problem: Safety: ?Goal: Ability to remain free from injury will improve ?Outcome: Progressing ?  ?

## 2020-11-10 NOTE — Progress Notes (Signed)
PROGRESS NOTE    Alexander Calderon  FKC:[id number] DOB: 08-Sep-1960 DOA: 11/06/2020 PCP: Loyal Jacobson, MD    Chief Complaint  Patient presents with  . Abdominal Pain    Brief Narrative:  60 year old gentleman prior history of coronary artery disease on aspirin, Plavix, type 2 diabetes, hypertension, hyperlipidemia admitted for abdominal pain he was found to have acute cholecystitis. General surgery consulted and he scheduled for lap cholecystectomy on 11/10/2020. Pt seen and examined at bedside. No new complaints.   Assessment & Plan:   Active Problems:   Cholecystitis   Pain   Acute cholecystitis Plan for lap cholecystectomy on 11/10/2020, to be off Plavix for at least 5 days. Continue symptomatic management with antiemetics, IV fluids and IV antibiotics. Improvement in liver enzymes. Pt denies any nausea, vomiting or abd pain.     History of coronary artery disease s/p multiple stents Patient at home on aspirin and Plavix, statin. Plan to hold her Plavix for 5 days and no indication for heparin bridge. Statin on hold for elevated liver enzymes.   Essential hypertension Blood pressure parameters optimal.     Type 2 diabetes mellitus CBG (last 3)  Recent Labs    11/09/20 2019 11/10/20 0809 11/10/20 1236  GLUCAP 218* 132* 129*   CBGs not well controlled, A1c at 6.9.   Hyperlipidemia Holding statins for elevated liver enzymes.    GERD Stable on PPI.        DVT prophylaxis: (Lovenox) Code Status: (Full code) Family Communication: none at bedside.  Disposition:   Status is: Inpatient  Remains inpatient appropriate because:Ongoing diagnostic testing needed not appropriate for outpatient work up and IV treatments appropriate due to intensity of illness or inability to take PO   Dispo: The patient is from: Home              Anticipated d/c is to: Home              Patient currently is not medically stable to d/c.   Difficult to place patient  No       Consultants:   Surgery.    Procedures:  Scheduled for lap chol on 3/9  Antimicrobials:  Antibiotics Given (last 72 hours)    Date/Time Action Medication Dose Rate   11/08/20 0509 New Bag/Given   cefTRIAXone (ROCEPHIN) 2 g in sodium chloride 0.9 % 100 mL IVPB 2 g 200 mL/hr   11/09/20 0602 New Bag/Given   cefTRIAXone (ROCEPHIN) 2 g in sodium chloride 0.9 % 100 mL IVPB 2 g 200 mL/hr   11/10/20 0547 New Bag/Given   cefTRIAXone (ROCEPHIN) 2 g in sodium chloride 0.9 % 100 mL IVPB 2 g 200 mL/hr         Subjective: No nausea or vomiting.   Objective: Vitals:   11/09/20 2022 11/10/20 0507 11/10/20 1230 11/10/20 1400  BP: (!) 141/84 126/74 111/71   Pulse: 85 73 83   Resp: 20 18    Temp: 99.1 F (37.3 C) 97.6 F (36.4 C)  98.5 F (36.9 C)  TempSrc: Oral Oral  Oral  SpO2: 97% 96%    Weight:      Height:        Intake/Output Summary (Last 24 hours) at 11/10/2020 1552 Last data filed at 11/10/2020 0600 Gross per 24 hour  Intake 40.04 ml  Output --  Net 40.04 ml   Filed Weights   11/06/20 0429 11/06/20 1158  Weight: 70.3 kg 70.3 kg    Examination:  General exam: alert  and comfortable.  Respiratory system: Clear to auscultation. Respiratory effort normal. Cardiovascular system: S1S2, rrr, NO JVD, no pedal edema.  Gastrointestinal system: Abdomen is soft, non tender non distended, bowel sounds wnl Central nervous system: Alert and oriented, no n focal.  Extremities: no pedal edema.  Skin: No rashes seen.  Psychiatry: Mood appropriate.     Data Reviewed: I have personally reviewed following labs and imaging studies  CBC: Recent Labs  Lab 11/06/20 0444 11/06/20 1229 11/07/20 0353 11/08/20 0433 11/09/20 0444 11/10/20 0415  WBC 10.1 6.5 5.1 4.3 5.7 6.3  NEUTROABS 8.0*  --   --   --   --   --   HGB 12.6* 12.2* 11.9* 12.0* 12.6* 13.1  HCT 38.0* 37.5* 36.9* 36.9* 38.0* 41.1  MCV 85.6 86.4 86.4 87.2 85.6 86.3  PLT 223 164 157 179 199 236    Basic  Metabolic Panel: Recent Labs  Lab 11/06/20 0444 11/06/20 1229 11/07/20 0353 11/08/20 0433 11/09/20 0444 11/10/20 0415  NA 139  --  137 138 138 140  K 3.9  --  4.5 3.6 4.0 4.0  CL 101  --  100 103 104 104  CO2 28  --  29 25 27 24   GLUCOSE 132*  --  183* 130* 159* 120*  BUN 18  --  15 13 18 19   CREATININE 0.85 0.75 0.89 0.89 0.93 1.02  CALCIUM 8.8*  --  8.8* 8.9 9.1 9.4    GFR: Estimated Creatinine Clearance: 68.7 mL/min (by C-G formula based on SCr of 1.02 mg/dL).  Liver Function Tests: Recent Labs  Lab 11/06/20 0444 11/07/20 0353 11/08/20 0433 11/09/20 0444 11/10/20 0415  AST 36 5,379* 1,034* 220* 72*  ALT 49* 4,185* 2,078* 1,317* 855*  ALKPHOS 65 309* 398* 405* 364*  BILITOT 0.3 2.9* 1.9* 1.2 1.0  PROT 6.7 6.1* 6.6 6.6 6.8  ALBUMIN 4.1 3.6 3.6 3.7 3.7    CBG: Recent Labs  Lab 11/09/20 1115 11/09/20 1655 11/09/20 2019 11/10/20 0809 11/10/20 1236  GLUCAP 341* 111* 218* 132* 129*     Recent Results (from the past 240 hour(s))  Resp Panel by RT-PCR (Flu A&B, Covid) Nasopharyngeal Swab     Status: None   Collection Time: 11/06/20  6:25 AM   Specimen: Nasopharyngeal Swab; Nasopharyngeal(NP) swabs in vial transport medium  Result Value Ref Range Status   SARS Coronavirus 2 by RT PCR NEGATIVE NEGATIVE Final    Comment: (NOTE) SARS-CoV-2 target nucleic acids are NOT DETECTED.  The SARS-CoV-2 RNA is generally detectable in upper respiratory specimens during the acute phase of infection. The lowest concentration of SARS-CoV-2 viral copies this assay can detect is 138 copies/mL. A negative result does not preclude SARS-Cov-2 infection and should not be used as the sole basis for treatment or other patient management decisions. A negative result may occur with  improper specimen collection/handling, submission of specimen other than nasopharyngeal swab, presence of viral mutation(s) within the areas targeted by this assay, and inadequate number of  viral copies(<138 copies/mL). A negative result must be combined with clinical observations, patient history, and epidemiological information. The expected result is Negative.  Fact Sheet for Patients:  01/10/21  Fact Sheet for Healthcare Providers:  01/06/21  This test is no t yet approved or cleared by the [employer] FDA and  has been authorized for detection and/or diagnosis of SARS-CoV-2 by FDA under an Emergency Use Authorization (EUA). This EUA will remain  in effect (meaning this test can be used) for the  duration of the COVID-19 declaration under Section 564(b)(1) of the Act, 21 U.S.C.section 360bbb-3(b)(1), unless the authorization is terminated  or revoked sooner.       Influenza A by PCR NEGATIVE NEGATIVE Final   Influenza B by PCR NEGATIVE NEGATIVE Final    Comment: (NOTE) The Xpert Xpress SARS-CoV-2/FLU/RSV plus assay is intended as an aid in the diagnosis of influenza from Nasopharyngeal swab specimens and should not be used as a sole basis for treatment. Nasal washings and aspirates are unacceptable for Xpert Xpress SARS-CoV-2/FLU/RSV testing.  Fact Sheet for Patients: [employer]  Fact Sheet for Healthcare Providers: [employer]  This test is not yet approved or cleared by the [place] FDA and has been authorized for detection and/or diagnosis of SARS-CoV-2 by FDA under an Emergency Use Authorization (EUA). This EUA will remain in effect (meaning this test can be used) for the duration of the COVID-19 declaration under Section 564(b)(1) of the Act, 21 U.S.C. section 360bbb-3(b)(1), unless the authorization is terminated or revoked.  Performed at [facility], [address] [place], [place] [address]   Surgical pcr screen     Status: None   Collection Time: 11/09/20  6:03 AM   Specimen: Nasal Mucosa; Nasal  Swab  Result Value Ref Range Status   MRSA, PCR NEGATIVE NEGATIVE Final   Staphylococcus aureus NEGATIVE NEGATIVE Final    Comment: (NOTE) The Xpert SA Assay (FDA approved for NASAL specimens in patients 49 years of age and older), is one component of a comprehensive surveillance program. It is not intended to diagnose infection nor to guide or monitor treatment. Performed at [facility], [address], [place], [place] [address]          Radiology Studies: No results found.      Scheduled Meds: . aspirin  81 mg Oral Daily  . buPROPion  450 mg Oral q morning  . docusate sodium  100 mg Oral BID  . enoxaparin (LOVENOX) injection  40 mg Subcutaneous Q24H  . insulin aspart  0-20 Units Subcutaneous TID WC  . insulin aspart  0-5 Units Subcutaneous QHS  . mirtazapine  45 mg Oral QHS  . montelukast  10 mg Oral Daily  . pantoprazole  40 mg Oral Daily   Continuous Infusions: . cefTRIAXone (ROCEPHIN)  IV 2 g (11/10/20 0547)     LOS: 4 days        Kathlen Mody, MD [facility]   To contact the attending provider between 7A-7P or the covering provider during after hours 7P-7A, please log into the web site [email or web address] and access using universal [place] password for that web site. If you do not have the password, please call the hospital operator.  11/10/2020, 3:52 PM

## 2020-11-10 NOTE — Plan of Care (Signed)
Alert and oriented x 4, RR even and unlabored on room air, resting in bed, bed low and locked, NPO for surgery. No other needs identified. Will continue to monitor.  Problem: Education: Goal: Knowledge of General Education information will improve Description: Including pain rating scale, medication(s)/side effects and non-pharmacologic comfort measures Outcome: Progressing   Problem: Health Behavior/Discharge Planning: Goal: Ability to manage health-related needs will improve Outcome: Progressing   Problem: Clinical Measurements: Goal: Ability to maintain clinical measurements within normal limits will improve Outcome: Progressing Goal: Will remain free from infection Outcome: Progressing Goal: Diagnostic test results will improve Outcome: Progressing Goal: Respiratory complications will improve Outcome: Progressing Goal: Cardiovascular complication will be avoided Outcome: Progressing   Problem: Activity: Goal: Risk for activity intolerance will decrease Outcome: Progressing   Problem: Nutrition: Goal: Adequate nutrition will be maintained Outcome: Progressing   Problem: Coping: Goal: Level of anxiety will decrease Outcome: Progressing   Problem: Elimination: Goal: Will not experience complications related to bowel motility Outcome: Progressing Goal: Will not experience complications related to urinary retention Outcome: Progressing   Problem: Pain Managment: Goal: General experience of comfort will improve Outcome: Progressing   Problem: Safety: Goal: Ability to remain free from injury will improve Outcome: Progressing   Problem: Skin Integrity: Goal: Risk for impaired skin integrity will decrease Outcome: Progressing

## 2020-11-11 ENCOUNTER — Inpatient Hospital Stay (HOSPITAL_COMMUNITY): Payer: Federal, State, Local not specified - PPO | Admitting: Certified Registered Nurse Anesthetist

## 2020-11-11 ENCOUNTER — Inpatient Hospital Stay (HOSPITAL_COMMUNITY): Payer: Federal, State, Local not specified - PPO

## 2020-11-11 ENCOUNTER — Encounter (HOSPITAL_COMMUNITY): Payer: Self-pay | Admitting: Internal Medicine

## 2020-11-11 ENCOUNTER — Encounter (HOSPITAL_COMMUNITY)
Admission: EM | Disposition: A | Discharge status: Home / Self Care | Payer: Self-pay | Source: Home / Self Care | Attending: Internal Medicine

## 2020-11-11 HISTORY — PX: CHOLECYSTECTOMY: SHX55

## 2020-11-11 LAB — GLUCOSE, CAPILLARY
Glucose-Capillary: 134 mg/dL — ABNORMAL HIGH (ref 70–99)
Glucose-Capillary: 134 mg/dL — ABNORMAL HIGH (ref 70–99)
Glucose-Capillary: 174 mg/dL — ABNORMAL HIGH (ref 70–99)
Glucose-Capillary: 171 mg/dL — ABNORMAL HIGH (ref 70–99)
Glucose-Capillary: 219 mg/dL — ABNORMAL HIGH (ref 70–99)
Glucose-Capillary: 270 mg/dL — ABNORMAL HIGH (ref 70–99)

## 2020-11-11 LAB — CBC
HCT: 43.3 % (ref 39.0–52.0)
Hemoglobin: 14 g/dL (ref 13.0–17.0)
MCH: 28.2 pg (ref 26.0–34.0)
MCHC: 32.3 g/dL (ref 30.0–36.0)
MCV: 87.1 fL (ref 80.0–100.0)
Platelets: 251 10*3/uL (ref 150–400)
RBC: 4.97 MIL/uL (ref 4.22–5.81)
RDW: 13.1 % (ref 11.5–15.5)
WBC: 6.9 10*3/uL (ref 4.0–10.5)
nRBC: 0 % (ref 0.0–0.2)

## 2020-11-11 LAB — COMPREHENSIVE METABOLIC PANEL
ALT: 619 U/L — ABNORMAL HIGH (ref 0–44)
AST: 81 U/L — ABNORMAL HIGH (ref 15–41)
Albumin: 4.2 g/dL (ref 3.5–5.0)
Alkaline Phosphatase: 349 U/L — ABNORMAL HIGH (ref 38–126)
Anion gap: 11 (ref 5–15)
BUN: 19 mg/dL (ref 6–20)
CO2: 27 mmol/L (ref 22–32)
Calcium: 9.4 mg/dL (ref 8.9–10.3)
Chloride: 100 mmol/L (ref 98–111)
Creatinine, Ser: 0.95 mg/dL (ref 0.61–1.24)
GFR, Estimated: >60 mL/min (ref >60)
Glucose, Bld: 113 mg/dL — ABNORMAL HIGH (ref 70–99)
Potassium: 4 mmol/L (ref 3.5–5.1)
Sodium: 138 mmol/L (ref 135–145)
Total Bilirubin: 1.1 mg/dL (ref 0.3–1.2)
Total Protein: 7.4 g/dL (ref 6.5–8.1)

## 2020-11-11 SURGERY — LAPAROSCOPIC CHOLECYSTECTOMY WITH INTRAOPERATIVE CHOLANGIOGRAM
Anesthesia: General

## 2020-11-11 MED ORDER — BUPIVACAINE LIPOSOME 1.3 % IJ SUSP
20.0000 mL | Freq: Once | INTRAMUSCULAR | Status: DC
  Filled 2020-11-11: qty 20

## 2020-11-11 MED ORDER — FENTANYL CITRATE (PF) 100 MCG/2ML IJ SOLN
INTRAMUSCULAR | Status: AC
  Filled 2020-11-11: qty 2

## 2020-11-11 MED ORDER — LIDOCAINE 2% (20 MG/ML) 5 ML SYRINGE
INTRAMUSCULAR | Status: DC | PRN
  Administered 2020-11-11: 50 mg via INTRAVENOUS

## 2020-11-11 MED ORDER — DROPERIDOL 2.5 MG/ML IJ SOLN: 0.6250 mg | Freq: Once | INTRAMUSCULAR | Status: DC | PRN

## 2020-11-11 MED ORDER — MORPHINE SULFATE (PF) 2 MG/ML IV SOLN
1.0000 mg | INTRAVENOUS | Status: DC | PRN
  Administered 2020-11-12 (×3): 2 mg via INTRAVENOUS
  Filled 2020-11-11 (×3): qty 1

## 2020-11-11 MED ORDER — MIDAZOLAM HCL 2 MG/2ML IJ SOLN
INTRAMUSCULAR | Status: AC
  Filled 2020-11-11: qty 2

## 2020-11-11 MED ORDER — PROPOFOL 10 MG/ML IV BOLUS
INTRAVENOUS | Status: DC | PRN
  Administered 2020-11-11: 150 mg via INTRAVENOUS
  Administered 2020-11-11 (×2): 50 mg via INTRAVENOUS

## 2020-11-11 MED ORDER — PROPOFOL 10 MG/ML IV BOLUS
INTRAVENOUS | Status: AC
  Filled 2020-11-11: qty 20

## 2020-11-11 MED ORDER — DEXAMETHASONE SODIUM PHOSPHATE 10 MG/ML IJ SOLN
INTRAMUSCULAR | Status: AC
  Filled 2020-11-11: qty 1

## 2020-11-11 MED ORDER — DEXAMETHASONE SODIUM PHOSPHATE 10 MG/ML IJ SOLN
INTRAMUSCULAR | Status: DC | PRN
  Administered 2020-11-11: 5 mg via INTRAVENOUS

## 2020-11-11 MED ORDER — LACTATED RINGERS IV SOLN: INTRAVENOUS | Status: DC | PRN

## 2020-11-11 MED ORDER — HYDROMORPHONE HCL 1 MG/ML IJ SOLN: 0.2500 mg | INTRAMUSCULAR | Status: DC | PRN

## 2020-11-11 MED ORDER — LABETALOL HCL 5 MG/ML IV SOLN
INTRAVENOUS | Status: DC | PRN
  Administered 2020-11-11: 5 mg via INTRAVENOUS

## 2020-11-11 MED ORDER — SODIUM CHLORIDE 0.9 % IV SOLN
INTRAVENOUS | Status: AC
  Filled 2020-11-11: qty 20

## 2020-11-11 MED ORDER — ACETAMINOPHEN 500 MG PO TABS
500.0000 mg | ORAL_TABLET | Freq: Four times a day (QID) | ORAL | Status: DC
  Administered 2020-11-11 – 2020-11-12 (×3): 500 mg via ORAL
  Filled 2020-11-11 (×3): qty 1

## 2020-11-11 MED ORDER — FENTANYL CITRATE (PF) 250 MCG/5ML IJ SOLN
INTRAMUSCULAR | Status: DC | PRN
  Administered 2020-11-11 (×6): 50 ug via INTRAVENOUS

## 2020-11-11 MED ORDER — ONDANSETRON HCL 4 MG/2ML IJ SOLN
INTRAMUSCULAR | Status: AC
  Filled 2020-11-11: qty 2

## 2020-11-11 MED ORDER — MIDAZOLAM HCL 2 MG/2ML IJ SOLN
INTRAMUSCULAR | Status: DC | PRN
  Administered 2020-11-11: 2 mg via INTRAVENOUS

## 2020-11-11 MED ORDER — LACTATED RINGERS IR SOLN
Status: DC | PRN
  Administered 2020-11-11: 3000 mL

## 2020-11-11 MED ORDER — 0.9 % SODIUM CHLORIDE (POUR BTL) OPTIME
TOPICAL | Status: DC | PRN
  Administered 2020-11-11: 1000 mL

## 2020-11-11 MED ORDER — BUPIVACAINE LIPOSOME 1.3 % IJ SUSP
INTRAMUSCULAR | Status: DC | PRN
  Administered 2020-11-11: 20 mL

## 2020-11-11 MED ORDER — IOHEXOL 300 MG/ML  SOLN
INTRAMUSCULAR | Status: DC | PRN
  Administered 2020-11-11: 50 mL

## 2020-11-11 MED ORDER — LABETALOL HCL 5 MG/ML IV SOLN
INTRAVENOUS | Status: AC
  Filled 2020-11-11: qty 4

## 2020-11-11 MED ORDER — LIDOCAINE 2% (20 MG/ML) 5 ML SYRINGE
INTRAMUSCULAR | Status: AC
  Filled 2020-11-11: qty 5

## 2020-11-11 MED ORDER — ONDANSETRON HCL 4 MG/2ML IJ SOLN
INTRAMUSCULAR | Status: DC | PRN
  Administered 2020-11-11: 4 mg via INTRAVENOUS

## 2020-11-11 MED ORDER — ROCURONIUM BROMIDE 10 MG/ML (PF) SYRINGE
PREFILLED_SYRINGE | INTRAVENOUS | Status: DC | PRN
  Administered 2020-11-11: 70 mg via INTRAVENOUS

## 2020-11-11 MED ORDER — ONDANSETRON HCL 4 MG/2ML IJ SOLN: 4.0000 mg | Freq: Once | INTRAMUSCULAR | Status: DC | PRN

## 2020-11-11 MED ORDER — SUGAMMADEX SODIUM 200 MG/2ML IV SOLN
INTRAVENOUS | Status: DC | PRN
  Administered 2020-11-11: 150 mg via INTRAVENOUS

## 2020-11-11 MED ORDER — ROCURONIUM BROMIDE 10 MG/ML (PF) SYRINGE
PREFILLED_SYRINGE | INTRAVENOUS | Status: AC
  Filled 2020-11-11: qty 10

## 2020-11-11 SURGICAL SUPPLY — 46 items
ADH SKN CLS APL DERMABOND .7 (GAUZE/BANDAGES/DRESSINGS) ×1
APL SKNCLS STERI-STRIP NONHPOA (GAUZE/BANDAGES/DRESSINGS)
APL SWBSTK 6 STRL LF DISP (MISCELLANEOUS) ×1
APPLICATOR COTTON TIP 6 STRL (MISCELLANEOUS) ×2 IMPLANT
APPLICATOR COTTON TIP 6IN STRL (MISCELLANEOUS) ×2 IMPLANT
APPLIER CLIP 5 13 M/L LIGAMAX5 (MISCELLANEOUS)
APPLIER CLIP ROT 10 11.4 M/L (STAPLE) ×2
APR CLP MED LRG 11.4X10 (STAPLE) ×1
APR CLP MED LRG 5 ANG JAW (MISCELLANEOUS)
BAG SPEC RTRVL 10 TROC 200 (ENDOMECHANICALS) ×1
BENZOIN TINCTURE PRP APPL 2/3 (GAUZE/BANDAGES/DRESSINGS) IMPLANT
CABLE HIGH FREQUENCY MONO STRZ (ELECTRODE) IMPLANT
CATH REDDICK CHOLANGI 4FR 50CM (CATHETERS) ×2 IMPLANT
CLIP APPLIE 5 13 M/L LIGAMAX5 (MISCELLANEOUS) IMPLANT
CLIP APPLIE ROT 10 11.4 M/L (STAPLE) IMPLANT
COVER MAYO STAND STRL (DRAPES) ×2 IMPLANT
COVER SURGICAL LIGHT HANDLE (MISCELLANEOUS) ×2 IMPLANT
COVER WAND RF STERILE (DRAPES) IMPLANT
DECANTER SPIKE VIAL GLASS SM (MISCELLANEOUS) ×2 IMPLANT
DERMABOND ADVANCED (GAUZE/BANDAGES/DRESSINGS) ×1
DERMABOND ADVANCED .7 DNX12 (GAUZE/BANDAGES/DRESSINGS) ×1 IMPLANT
DRAPE C-ARM 42X120 X-RAY (DRAPES) ×2 IMPLANT
ELECT L-HOOK LAP 45CM DISP (ELECTROSURGICAL) ×2
ELECT PENCIL ROCKER SW 15FT (MISCELLANEOUS) ×1 IMPLANT
ELECT REM PT RETURN 15FT ADLT (MISCELLANEOUS) ×2 IMPLANT
ELECTRODE L-HOOK LAP 45CM DISP (ELECTROSURGICAL) IMPLANT
GLOVE BIOGEL M 8.0 STRL (GLOVE) ×2 IMPLANT
GOWN STRL REUS W/TWL XL LVL3 (GOWN DISPOSABLE) ×2 IMPLANT
HEMOSTAT SURGICEL 4X8 (HEMOSTASIS) IMPLANT
IV CATH 14GX2 1/4 (CATHETERS) ×2 IMPLANT
KIT BASIN OR (CUSTOM PROCEDURE TRAY) ×2 IMPLANT
KIT TURNOVER KIT A (KITS) ×2 IMPLANT
POUCH RETRIEVAL ECOSAC 10 (ENDOMECHANICALS) IMPLANT
POUCH RETRIEVAL ECOSAC 10MM (ENDOMECHANICALS) ×2
SCISSORS LAP 5X45 EPIX DISP (ENDOMECHANICALS) ×2 IMPLANT
SET IRRIG TUBING LAPAROSCOPIC (IRRIGATION / IRRIGATOR) ×2 IMPLANT
SET TUBE SMOKE EVAC HIGH FLOW (TUBING) ×2 IMPLANT
SLEEVE XCEL OPT CAN 5 100 (ENDOMECHANICALS) ×3 IMPLANT
STRIP CLOSURE SKIN 1/2X4 (GAUZE/BANDAGES/DRESSINGS) IMPLANT
SUT MNCRL AB 4-0 PS2 18 (SUTURE) ×4 IMPLANT
SYR 20ML LL LF (SYRINGE) ×2 IMPLANT
TOWEL OR 17X26 10 PK STRL BLUE (TOWEL DISPOSABLE) ×2 IMPLANT
TRAY LAPAROSCOPIC (CUSTOM PROCEDURE TRAY) ×2 IMPLANT
TROCAR BLADELESS OPT 5 100 (ENDOMECHANICALS) ×2 IMPLANT
TROCAR XCEL BLUNT TIP 100MML (ENDOMECHANICALS) IMPLANT
TROCAR XCEL NON-BLD 11X100MML (ENDOMECHANICALS) ×1 IMPLANT

## 2020-11-11 NOTE — Plan of Care (Signed)
Pt aox4, no complains of pain and will continue plan of care.  Problem: Health Behavior/Discharge Planning: Goal: Ability to manage health-related needs will improve Outcome: Progressing   Problem: Clinical Measurements: Goal: Ability to maintain clinical measurements within normal limits will improve Outcome: Progressing   Problem: Clinical Measurements: Goal: Will remain free from infection Outcome: Progressing   Problem: Pain Managment: Goal: General experience of comfort will improve Outcome: Progressing

## 2020-11-11 NOTE — TOC Progression Note (Signed)
Transition of Care [facility]) - Progression Note    Patient Details  Name: Alexander Calderon MRN: [id number] Date of Birth: March 19, 1961  Transition of Care [place]) CM/SW Contact  Geni Bers, RN Phone Number: 11/11/2020, 2:55 PM  Clinical Narrative:     Pt plan to discharge home. At present time there are no home health needs.   Expected Discharge Plan: Home/Self Care Barriers to Discharge: No Barriers Identified  Expected Discharge Plan and Services Expected Discharge Plan: Home/Self Care       Living arrangements for the past 2 months: Single Family Home                                       Social Determinants of Health (SDOH) Interventions    Readmission Risk Interventions No flowsheet data found.

## 2020-11-11 NOTE — Anesthesia Preprocedure Evaluation (Addendum)
Anesthesia Evaluation  Patient identified by MRN, date of birth, ID band Patient awake    Reviewed: Allergy & Precautions, NPO status , Patient's Chart, lab work & pertinent test results, reviewed documented beta blocker date and time   Airway Mallampati: II  TM Distance: >3 FB Neck ROM: Full    Dental no notable dental hx. (+) Teeth Intact, Caps, Dental Advisory Given   Pulmonary asthma ,    Pulmonary exam normal breath sounds clear to auscultation       Cardiovascular hypertension, Pt. on medications + angina with exertion + CAD, + Past MI and + Cardiac Stents  Normal cardiovascular exam Rhythm:Regular Rate:Normal  Bare-metal stent to right coronary artery in 2000 Bare-metal stent to the right coronary artery at the stent edge in 2002 PTCA of distal circumflex 2011 Cardiac cath 2011-mild ostial left main, 40% mid LAD, subtotal stenosis distal circumflex, widely patent right coronary artery stents with 40% stenosis within the stent  initial radial cath complicated by discomfort and radial spasm with subsequent PTCA done through femoral approach MI x 2, 2002, 2011 BMS x 3  Echo 06/10/13 Left ventricle: The cavity size was normal. Wall thickness  was increased in a pattern of mild LVH. Systolic function  was vigorous. The estimated ejection fraction was in the  range of 65% to 70%. Wall motion was normal; there were no regional wall motion abnormalities.  - Atrial septum: No defect or patent foramen ovale was  identified.     EKG 11/06/20 NSR   Neuro/Psych PSYCHIATRIC DISORDERS Depression negative neurological ROS     GI/Hepatic Neg liver ROS, GERD  Medicated and Controlled,Cholelithiasis with acute cholecystitis Choledocholithiasis   Endo/Other  diabetes, Well Controlled, Type 2, Insulin DependentHyperlipidemia  Renal/GU negative Renal ROS  negative genitourinary   Musculoskeletal negative musculoskeletal  ROS (+)   Abdominal (+)  Abdomen: tender.    Peds  Hematology Plavix therapy- last dose 6 days ago   Anesthesia Other Findings   Reproductive/Obstetrics                            Anesthesia Physical Anesthesia Plan  ASA: III  Anesthesia Plan: General   Post-op Pain Management:    Induction: Intravenous, Cricoid pressure planned and Rapid sequence  PONV Risk Score and Plan: 4 or greater and Treatment may vary due to age or medical condition, Midazolam and Ondansetron  Airway Management Planned: Oral ETT  Additional Equipment: None  Intra-op Plan:   Post-operative Plan: Extubation in OR  Informed Consent: I have reviewed the patients History and Physical, chart, labs and discussed the procedure including the risks, benefits and alternatives for the proposed anesthesia with the patient or authorized representative who has indicated his/her understanding and acceptance.     Dental advisory given  Plan Discussed with: CRNA and Anesthesiologist  Anesthesia Plan Comments:         Anesthesia Quick Evaluation

## 2020-11-11 NOTE — Anesthesia Procedure Notes (Signed)
Procedure Name: Intubation Date/Time: 11/11/2020 1:21 PM Performed by: Shary Decamp, CRNA Pre-anesthesia Checklist: Patient identified, Patient being monitored, Timeout performed, Emergency Drugs available and Suction available Patient Re-evaluated:Patient Re-evaluated prior to induction Oxygen Delivery Method: Circle System Utilized Preoxygenation: Pre-oxygenation with 100% oxygen Induction Type: IV induction Ventilation: Mask ventilation without difficulty Laryngoscope Size: Miller and 2 Grade View: Grade I Tube type: Oral Tube size: 7.5 mm Number of attempts: 1 Airway Equipment and Method: Stylet Placement Confirmation: ETT inserted through vocal cords under direct vision,  positive ETCO2 and breath sounds checked- equal and bilateral Secured at: 21 cm Tube secured with: Tape Dental Injury: Teeth and Oropharynx as per pre-operative assessment

## 2020-11-11 NOTE — Interval H&P Note (Signed)
History and Physical Interval Note:  11/11/2020 12:17 PM  Alexander Calderon  has presented today for surgery, with the diagnosis of ACUTE CHOLECYSTITIS.  The various methods of treatment have been discussed with the patient and family. After consideration of risks, benefits and other options for treatment, the patient has consented to  Procedure(s): LAPAROSCOPIC CHOLECYSTECTOMY WITH POSSIBLE INTRAOPERATIVE CHOLANGIOGRAM (N/A) as a surgical intervention.  The patient's history has been reviewed, patient examined, no change in status, stable for surgery.  I have reviewed the patient's chart and labs.  Questions were answered to the patient's satisfaction.     [name]

## 2020-11-11 NOTE — Progress Notes (Signed)
PROGRESS NOTE    Alexander Calderon  BHA:[id number] DOB: 22-Dec-1960 DOA: 11/06/2020 PCP: Jefm Petty, MD    Chief Complaint  Patient presents with  . Abdominal Pain    Brief Narrative:  60 year old gentleman prior history of coronary artery disease on aspirin, Plavix, type 2 diabetes, hypertension, hyperlipidemia admitted for abdominal pain he was found to have acute cholecystitis. General surgery consulted and he scheduled for lap cholecystectomy on 11/10/2020. Pt seen and examined at bedside. No new complaints. Pt denies any nausea, vomiting or abdominal pain.   Assessment & Plan:   Active Problems:   Cholecystitis   Pain   Acute cholecystitis Plan for lap cholecystectomy with intra operative cholangiogram on 11/11/2020, to be off Plavix for at least 5 days. Continue symptomatic management with antiemetics, IV fluids and IV antibiotics. His liver enzymes are improving. Alk phos is 349.  Pt denies nausea, vomiting or abdominal pain. It couldn't be done due to scheduling issues.     History of coronary artery disease s/p multiple stents Patient at home on aspirin and Plavix, statin. Plan to hold her Plavix for 5 days and no indication for heparin bridge. Statin on hold for elevated liver enzymes.   Essential hypertension BP parameters are optimal.     Type 2 diabetes mellitus CBG (last 3)  Recent Labs    11/11/20 0754 11/11/20 1133 11/11/20 1504  GLUCAP 134* 134* 174*   CBGs not well controlled, A1c at 6.9.   Hyperlipidemia Holding statins for elevated liver enzymes.    GERD Stable on PPI.        DVT prophylaxis: (Lovenox) Code Status: (Full code) Family Communication: none at bedside.  Disposition:   Status is: Inpatient  Remains inpatient appropriate because:Ongoing diagnostic testing needed not appropriate for outpatient work up and IV treatments appropriate due to intensity of illness or inability to take PO   Dispo: The patient is from:  Home              Anticipated d/c is to: Home              Patient currently is not medically stable to d/c.   Difficult to place patient No       Consultants:   Surgery.    Procedures:  Scheduled for lap chol on 3/9  Antimicrobials:  Antibiotics Given (last 72 hours)    Date/Time Action Medication Dose Rate   11/09/20 0602 New Bag/Given   [MAR Hold] cefTRIAXone (ROCEPHIN) 2 g in sodium chloride 0.9 % 100 mL IVPB (MAR Hold since Thu 11/11/2020 at 1220.Hold Reason: Transfer to a Procedural area.) 2 g 200 mL/hr   11/10/20 0547 New Bag/Given   [MAR Hold] cefTRIAXone (ROCEPHIN) 2 g in sodium chloride 0.9 % 100 mL IVPB (MAR Hold since Thu 11/11/2020 at 1220.Hold Reason: Transfer to a Procedural area.) 2 g 200 mL/hr   11/11/20 0540 New Bag/Given   [MAR Hold] cefTRIAXone (ROCEPHIN) 2 g in sodium chloride 0.9 % 100 mL IVPB (MAR Hold since Thu 11/11/2020 at 1220.Hold Reason: Transfer to a Procedural area.) 2 g 200 mL/hr         Subjective: No nausea, vomiting or abdominal pain.  Objective: Vitals:   11/11/20 0538 11/11/20 1226 11/11/20 1236 11/11/20 1500  BP: 128/88 (!) 160/95  (!) 156/88  Pulse: 72 92  68  Resp: $Remo'16 18  13  'zLSWD$ Temp: 98 F (36.7 C) 98.6 F (37 C)  (!) 97.5 F (36.4 C)  TempSrc:  Oral  SpO2: 97% 100%  100%  Weight:   70.3 kg   Height:        Intake/Output Summary (Last 24 hours) at 11/11/2020 1513 Last data filed at 11/11/2020 1500 Gross per 24 hour  Intake 1100.07 ml  Output 5 ml  Net 1095.07 ml   Filed Weights   11/06/20 0429 11/06/20 1158 11/11/20 1236  Weight: 70.3 kg 70.3 kg 70.3 kg    Examination:  General exam: alert and oriented , no distress noted.  Respiratory system: clear to auscultation, no wheezing or rhonchi.  Cardiovascular system: S1S2, RRR no JVD.   Gastrointestinal system: Abdomen is soft, NT ND BS+ Central nervous system: Alert and oriented, non focal.  Extremities: No pedal edema.  Skin: No rashes seen.  Psychiatry: Mood  appropriate.     Data Reviewed: I have personally reviewed following labs and imaging studies  CBC: Recent Labs  Lab 11/06/20 0444 11/06/20 1229 11/07/20 0353 11/08/20 0433 11/09/20 0444 11/10/20 0415 11/11/20 0443  WBC 10.1   < > 5.1 4.3 5.7 6.3 6.9  NEUTROABS 8.0*  --   --   --   --   --   --   HGB 12.6*   < > 11.9* 12.0* 12.6* 13.1 14.0  HCT 38.0*   < > 36.9* 36.9* 38.0* 41.1 43.3  MCV 85.6   < > 86.4 87.2 85.6 86.3 87.1  PLT 223   < > 157 179 199 236 251   < > = values in this interval not displayed.    Basic Metabolic Panel: Recent Labs  Lab 11/07/20 0353 11/08/20 0433 11/09/20 0444 11/10/20 0415 11/11/20 0443  NA 137 138 138 140 138  K 4.5 3.6 4.0 4.0 4.0  CL 100 103 104 104 100  CO2 $Re'29 25 27 24 27  'XKh$ GLUCOSE 183* 130* 159* 120* 113*  BUN $Re'15 13 18 19 19  'wTe$ CREATININE 0.89 0.89 0.93 1.02 0.95  CALCIUM 8.8* 8.9 9.1 9.4 9.4    GFR: Estimated Creatinine Clearance: 73.7 mL/min (by C-G formula based on SCr of 0.95 mg/dL).  Liver Function Tests: Recent Labs  Lab 11/07/20 0353 11/08/20 0433 11/09/20 0444 11/10/20 0415 11/11/20 0443  AST 5,379* 1,034* 220* 72* 81*  ALT 4,185* 2,078* 1,317* 855* 619*  ALKPHOS 309* 398* 405* 364* 349*  BILITOT 2.9* 1.9* 1.2 1.0 1.1  PROT 6.1* 6.6 6.6 6.8 7.4  ALBUMIN 3.6 3.6 3.7 3.7 4.2    CBG: Recent Labs  Lab 11/10/20 1605 11/10/20 2050 11/11/20 0754 11/11/20 1133 11/11/20 1504  GLUCAP 101* 264* 134* 134* 174*     Recent Results (from the past 240 hour(s))  Resp Panel by RT-PCR (Flu A&B, Covid) Nasopharyngeal Swab     Status: None   Collection Time: 11/06/20  6:25 AM   Specimen: Nasopharyngeal Swab; Nasopharyngeal(NP) swabs in vial transport medium  Result Value Ref Range Status   SARS Coronavirus 2 by RT PCR NEGATIVE NEGATIVE Final    Comment: (NOTE) SARS-CoV-2 target nucleic acids are NOT DETECTED.  The SARS-CoV-2 RNA is generally detectable in upper respiratory specimens during the acute phase of  infection. The lowest concentration of SARS-CoV-2 viral copies this assay can detect is 138 copies/mL. A negative result does not preclude SARS-Cov-2 infection and should not be used as the sole basis for treatment or other patient management decisions. A negative result may occur with  improper specimen collection/handling, submission of specimen other than nasopharyngeal swab, presence of viral mutation(s) within the areas targeted by this assay,  and inadequate number of viral copies(<138 copies/mL). A negative result must be combined with clinical observations, patient history, and epidemiological information. The expected result is Negative.  Fact Sheet for Patients:  EntrepreneurPulse.com.au  Fact Sheet for Healthcare Providers:  IncredibleEmployment.be  This test is no t yet approved or cleared by the [place] FDA and  has been authorized for detection and/or diagnosis of SARS-CoV-2 by FDA under an Emergency Use Authorization (EUA). This EUA will remain  in effect (meaning this test can be used) for the duration of the COVID-19 declaration under Section 564(b)(1) of the Act, 21 U.S.C.section 360bbb-3(b)(1), unless the authorization is terminated  or revoked sooner.       Influenza A by PCR NEGATIVE NEGATIVE Final   Influenza B by PCR NEGATIVE NEGATIVE Final    Comment: (NOTE) The Xpert Xpress SARS-CoV-2/FLU/RSV plus assay is intended as an aid in the diagnosis of influenza from Nasopharyngeal swab specimens and should not be used as a sole basis for treatment. Nasal washings and aspirates are unacceptable for Xpert Xpress SARS-CoV-2/FLU/RSV testing.  Fact Sheet for Patients: EntrepreneurPulse.com.au  Fact Sheet for Healthcare Providers: IncredibleEmployment.be  This test is not yet approved or cleared by the [place] FDA and has been authorized for detection and/or diagnosis of SARS-CoV-2  by FDA under an Emergency Use Authorization (EUA). This EUA will remain in effect (meaning this test can be used) for the duration of the COVID-19 declaration under Section 564(b)(1) of the Act, 21 U.S.C. section 360bbb-3(b)(1), unless the authorization is terminated or revoked.  Performed at [facility], [address], [place], [place] [address]   Surgical pcr screen     Status: None   Collection Time: 11/09/20  6:03 AM   Specimen: Nasal Mucosa; Nasal Swab  Result Value Ref Range Status   MRSA, PCR NEGATIVE NEGATIVE Final   Staphylococcus aureus NEGATIVE NEGATIVE Final    Comment: (NOTE) The Xpert SA Assay (FDA approved for NASAL specimens in patients 71 years of age and older), is one component of a comprehensive surveillance program. It is not intended to diagnose infection nor to guide or monitor treatment. Performed at [facility], [place] [address] [place], [place] [address]          Radiology Studies: DG Cholangiogram Operative  Result Date: 11/11/2020 CLINICAL DATA:  60 year old male with history of acute cholecystitis. EXAM: INTRAOPERATIVE CHOLANGIOGRAM TECHNIQUE: Cholangiographic images from the C-arm fluoroscopic device were submitted for interpretation post-operatively. Please see the procedural report for the amount of contrast and the fluoroscopy time utilized. COMPARISON:  11/06/2020 FINDINGS: Intraoperative antegrade injection via the cystic duct which opacifies the common bile duct and central portions of the intrahepatic biliary tree. Contrast is visualized flowing freely into the duodenum. There are no filling defects. No significant intra or extrahepatic biliary ductal dilation. No apparent anomalous anatomical configuration of the biliary tree. IMPRESSION: No evidence of choledocholithiasis. Ruthann Cancer, MD [facility] Radiology Electronically Signed   By: Ruthann Cancer MD   On:  11/11/2020 14:27        Scheduled Meds: . [MAR Hold] aspirin  81 mg Oral Daily  . bupivacaine liposome  20 mL Infiltration Once  . [MAR Hold] buPROPion  450 mg Oral q morning  . [MAR Hold] docusate sodium  100 mg Oral BID  . [MAR Hold] enoxaparin (LOVENOX) injection  40 mg Subcutaneous Q24H  . [MAR Hold] insulin aspart  0-20 Units Subcutaneous TID WC  . [  MAR Hold] insulin aspart  0-5 Units Subcutaneous QHS  . [MAR Hold] mirtazapine  45 mg Oral QHS  . [MAR Hold] montelukast  10 mg Oral Daily  . [MAR Hold] pantoprazole  40 mg Oral Daily   Continuous Infusions: . [MAR Hold] cefTRIAXone (ROCEPHIN)  IV 2 g (11/11/20 0540)  . cefTRIAXone (ROCEPHIN) IVPB 2 gram/100 mL NS (Mini-Bag Plus)       LOS: 5 days        Hosie Poisson, MD [facility]   To contact the attending provider between 7A-7P or the covering provider during after hours 7P-7A, please log into the web site [email or web address] and access using universal [place] password for that web site. If you do not have the password, please call the hospital operator.  11/11/2020, 3:13 PM

## 2020-11-11 NOTE — Op Note (Signed)
Alexander Calderon  Primary Care Physician:  Loyal Jacobson, MD    11/11/2020  2:55 PM  Procedure: Laparoscopic Cholecystectomy with intraoperative cholangiogram  Surgeon: Susy Frizzle B. Daphine Deutscher, MD, FACS Asst:  none  Anes:  General  Drains:  None  Findings: Chronic cholecystitis with normal IOC  Description of Procedure: The patient was taken to OR 1 and given general anesthesia.  The patient was prepped with chlorohexidine prep and draped sterilely. A time out was performed including identifying the patient and discussing their procedure.  Access to the abdomen was achieved with a 5 mm Optiview through the right upper quadrant.  Port placement included three 5 mm trocars and a 11 mm trocar in the upper midline.    The gallbladder was visualized and appeared chronically inflamed.   The fundus of the gallbaldder was grasped and the gallbladder was elevated. Traction on the infundibulum allowed for successful demonstration of the critical view. Inflammatory changes were chronic and acute.  The cystic duct was identified and clipped up on the gallbladder and an incision was made in the cystic duct and the Reddick catheter was inserted after milking the cystic duct of any debris. A dynamic cholangiogram was performed which demonstrated normal findings.    The cystic duct was then triple clipped and divided, the cystic artery was double clipped and divided and then the gallbladder was removed from the gallbladder bed. Removal of the gallbladder from the gallbladder bed was straight forward.  The gallbladder was then placed in a bag and brought out through one of the trocar sites. The gallbladder bed was inspected and no bleeding or bile leaks were seen.   The upper midline trocar site was closed with 0 vicryl.    Incisions were injected with Exparel and closed with 4-0 Monocryl and Dermabond on the skin.  Sponge and needle count were correct.    The patient was taken to the recovery room in satisfactory  condition.

## 2020-11-11 NOTE — Anesthesia Postprocedure Evaluation (Signed)
Anesthesia Post Note  Patient: Alexander Calderon  Procedure(s) Performed: LAPAROSCOPIC CHOLECYSTECTOMY WITH  INTRAOPERATIVE CHOLANGIOGRAM (N/A )     Patient location during evaluation: PACU Anesthesia Type: General Level of consciousness: awake and alert and oriented Pain management: pain level controlled Vital Signs Assessment: post-procedure vital signs reviewed and stable Respiratory status: spontaneous breathing, nonlabored ventilation and respiratory function stable Cardiovascular status: blood pressure returned to baseline and stable Postop Assessment: no apparent nausea or vomiting Anesthetic complications: no   No complications documented.  Last Vitals:  Vitals:   11/11/20 1530 11/11/20 1545  BP: (!) 149/88 140/88  Pulse: 72 69  Resp: 18 13  Temp: 36.6 C   SpO2: 100% 100%    Last Pain:  Vitals:   11/11/20 1545  TempSrc:   PainSc: 0-No pain                 Bran Aldridge,Alford A.

## 2020-11-11 NOTE — Progress Notes (Signed)
   11/11/20 1617  Vitals  BP 138/81  MAP (mmHg) 98  BP Location Left Arm  BP Method Automatic  Patient Position (if appropriate) Lying  Pulse Rate 72  Pulse Rate Source Dinamap  ECG Heart Rate 70  Resp 16  Level of Consciousness  Level of Consciousness Alert  Oxygen Therapy  SpO2 98 %  O2 Device Nasal Cannula  O2 Flow Rate (L/min) 2 L/min  Pain Assessment  Pain Scale 0-10  Pain Score 0  Returned from surgery.Patient alert and oriented x 4. On 2L upon returning. O2 sats 98%. Weaned down to 1L as patient is requesting to keep the oxygen on for now. Given IV Zophran for nausea. Endorses pain coming back on.  Four dermabond sites to belly, clean dry and intact. Family at beside. Will continue to monitor.

## 2020-11-11 NOTE — Transfer of Care (Signed)
Immediate Anesthesia Transfer of Care Note  Patient: Alexander Calderon  Procedure(s) Performed: LAPAROSCOPIC CHOLECYSTECTOMY WITH  INTRAOPERATIVE CHOLANGIOGRAM (N/A )  Patient Location: PACU  Anesthesia Type:General  Level of Consciousness: awake, alert  and oriented  Airway & Oxygen Therapy: Patient Spontanous Breathing and Patient connected to face mask oxygen  Post-op Assessment: Report given to RN, Post -op Vital signs reviewed and stable and Patient moving all extremities X 4  Post vital signs: Reviewed and stable  Last Vitals:  Vitals Value Taken Time  BP    Temp    Pulse 68 11/11/20 1500  Resp 13 11/11/20 1500  SpO2 100 % 11/11/20 1500  Vitals shown include unvalidated device data.  Last Pain:  Vitals:   11/11/20 1236  TempSrc:   PainSc: 0-No pain      Patients Stated Pain Goal: 0 (11/08/20 0830)  Complications: No complications documented.

## 2020-11-11 NOTE — Progress Notes (Signed)
Shift summary:   Had procedure done today see note, remained alert and oriented x 4 , on clear liquid diet, advance as tolerated, given Lovenox,"watch for bleeding," per MD Kathlen Mody.  Surgical sites remain dry and intact. Complaint of pain given tylenol, followed by oxi. No other needs identified. Will continued to monitor.

## 2020-11-11 NOTE — Progress Notes (Signed)
Patient to procedure, pushed down via stretcher, CHG bath done prior to leaving. Unable to give insulin as patient had already left floor prior before receiving meal time insulin.

## 2020-11-12 ENCOUNTER — Inpatient Hospital Stay (HOSPITAL_COMMUNITY): Payer: Federal, State, Local not specified - PPO

## 2020-11-12 ENCOUNTER — Encounter (HOSPITAL_COMMUNITY): Payer: Self-pay | Admitting: Surgery

## 2020-11-12 LAB — GLUCOSE, CAPILLARY
Glucose-Capillary: 131 mg/dL — ABNORMAL HIGH (ref 70–99)
Glucose-Capillary: 204 mg/dL — ABNORMAL HIGH (ref 70–99)
Glucose-Capillary: 206 mg/dL — ABNORMAL HIGH (ref 70–99)
Glucose-Capillary: 153 mg/dL — ABNORMAL HIGH (ref 70–99)

## 2020-11-12 LAB — SURGICAL PATHOLOGY

## 2020-11-12 LAB — HEPATIC FUNCTION PANEL
ALT: 453 U/L — ABNORMAL HIGH (ref 0–44)
AST: 78 U/L — ABNORMAL HIGH (ref 15–41)
Albumin: 4.1 g/dL (ref 3.5–5.0)
Alkaline Phosphatase: 298 U/L — ABNORMAL HIGH (ref 38–126)
Bilirubin, Direct: 0.2 mg/dL (ref 0.0–0.2)
Indirect Bilirubin: 0.4 mg/dL (ref 0.3–0.9)
Total Bilirubin: 0.6 mg/dL (ref 0.3–1.2)
Total Protein: 7.4 g/dL (ref 6.5–8.1)

## 2020-11-12 LAB — CBC
HCT: 40.9 % (ref 39.0–52.0)
Hemoglobin: 13 g/dL (ref 13.0–17.0)
MCH: 27.8 pg (ref 26.0–34.0)
MCHC: 31.8 g/dL (ref 30.0–36.0)
MCV: 87.6 fL (ref 80.0–100.0)
Platelets: 254 10*3/uL (ref 150–400)
RBC: 4.67 MIL/uL (ref 4.22–5.81)
RDW: 12.9 % (ref 11.5–15.5)
WBC: 8.3 10*3/uL (ref 4.0–10.5)
nRBC: 0 % (ref 0.0–0.2)

## 2020-11-12 LAB — TROPONIN I (HIGH SENSITIVITY)
Troponin I (High Sensitivity): 18 ng/L — ABNORMAL HIGH (ref <18)
Troponin I (High Sensitivity): 16 ng/L (ref <18)

## 2020-11-12 MED ORDER — ACETAMINOPHEN 500 MG PO TABS
1000.0000 mg | ORAL_TABLET | Freq: Four times a day (QID) | ORAL | Status: DC
  Administered 2020-11-12: 1000 mg via ORAL
  Filled 2020-11-12: qty 2

## 2020-11-12 MED ORDER — ALUM & MAG HYDROXIDE-SIMETH 200-200-20 MG/5ML PO SUSP
30.0000 mL | Freq: Once | ORAL | Status: AC
  Administered 2020-11-12: 30 mL via ORAL
  Filled 2020-11-12: qty 30

## 2020-11-12 MED ORDER — SIMETHICONE 40 MG/0.6ML PO SUSP
40.0000 mg | Freq: Four times a day (QID) | ORAL | Status: DC | PRN
  Administered 2020-11-12 – 2020-11-13 (×3): 40 mg via ORAL
  Filled 2020-11-12 (×5): qty 0.6

## 2020-11-12 MED ORDER — OXYCODONE HCL 5 MG PO TABS
5.0000 mg | ORAL_TABLET | ORAL | Status: DC | PRN
  Administered 2020-11-12: 10 mg via ORAL
  Administered 2020-11-12: 5 mg via ORAL
  Filled 2020-11-12: qty 2
  Filled 2020-11-12: qty 1

## 2020-11-12 MED ORDER — LIDOCAINE VISCOUS HCL 2 % MT SOLN
15.0000 mL | Freq: Once | OROMUCOSAL | Status: AC
  Administered 2020-11-12: 15 mL via ORAL
  Filled 2020-11-12: qty 15

## 2020-11-12 MED ORDER — ACETAMINOPHEN 325 MG PO TABS: 650.0000 mg | ORAL_TABLET | ORAL | Status: DC | PRN

## 2020-11-12 MED ORDER — LIDOCAINE 5 % EX PTCH
1.0000 | MEDICATED_PATCH | Freq: Every day | CUTANEOUS | Status: DC
  Administered 2020-11-12: 1 via TRANSDERMAL
  Filled 2020-11-12 (×4): qty 1

## 2020-11-12 MED ORDER — METHOCARBAMOL 500 MG PO TABS
750.0000 mg | ORAL_TABLET | Freq: Three times a day (TID) | ORAL | Status: DC
  Administered 2020-11-12 – 2020-11-13 (×4): 750 mg via ORAL
  Filled 2020-11-12 (×4): qty 2

## 2020-11-12 NOTE — Discharge Instructions (Signed)
[facility] LAPAROSCOPIC SURGERY: POST OP INSTRUCTIONS Always review your discharge instruction sheet given to you by the facility where your surgery was performed. IF YOU HAVE DISABILITY OR FAMILY LEAVE FORMS, YOU MUST BRING THEM TO THE OFFICE FOR PROCESSING.   DO NOT GIVE THEM TO YOUR DOCTOR.  PAIN CONTROL  1. First take acetaminophen (Tylenol) AND/or ibuprofen (Advil) to control your pain after surgery.  Follow directions on package.  Taking acetaminophen (Tylenol) and/or ibuprofen (Advil) regularly after surgery will help to control your pain and lower the amount of prescription pain medication you may need.  You should not take more than 3,000 mg (3 grams) of acetaminophen (Tylenol) in 24 hours.  You should not take ibuprofen (Advil), aleve, motrin, naprosyn or other NSAIDS if you have a history of stomach ulcers or chronic kidney disease.  2. A prescription for pain medication may be given to you upon discharge.  Take your pain medication as prescribed, if you still have uncontrolled pain after taking acetaminophen (Tylenol) or ibuprofen (Advil). 3. Use ice packs to help control pain. 4. If you need a refill on your pain medication, please contact your pharmacy.  They will contact our office to request authorization. Prescriptions will not be filled after 5pm or on week-ends.  HOME MEDICATIONS 5. Take your usually prescribed medications unless otherwise directed.  DIET 6. You should follow a light diet the first few days after arrival home.  Be sure to include lots of fluids daily. Avoid fatty, fried foods.   CONSTIPATION 7. It is common to experience some constipation after surgery and if you are taking pain medication.  Increasing fluid intake and taking a stool softener (such as Colace) will usually help or prevent this problem from occurring.  A mild laxative (Milk of Magnesia or Miralax) should be taken according to package instructions if there are no bowel  movements after 48 hours.  WOUND/INCISION CARE 8. Most patients will experience some swelling and bruising in the area of the incisions.  Ice packs will help.  Swelling and bruising can take several days to resolve.  9. Unless discharge instructions indicate otherwise, follow guidelines below  a. STERI-STRIPS - you may remove your outer bandages 48 hours after surgery, and you may shower at that time.  You have steri-strips (small skin tapes) in place directly over the incision.  These strips should be left on the skin for 7-10 days.   b. DERMABOND/SKIN GLUE - you may shower in 24 hours.  The glue will flake off over the next 2-3 weeks. 10. Any sutures or staples will be removed at the office during your follow-up visit.  ACTIVITIES 11. You may resume regular (light) daily activities beginning the next day--such as daily self-care, walking, climbing stairs--gradually increasing activities as tolerated.  You may have sexual intercourse when it is comfortable.  Refrain from any heavy lifting or straining until approved by your doctor. a. You may drive when you are no longer taking prescription pain medication, you can comfortably wear a seatbelt, and you can safely maneuver your car and apply brakes.  FOLLOW-UP 12. You should see your doctor in the office for a follow-up appointment approximately 2-3 weeks after your surgery.  You should have been given your post-op/follow-up appointment when your surgery was scheduled.  If you did not receive a post-op/follow-up appointment, make sure that you call for this appointment within a day or two after you arrive home to insure a convenient appointment time.  WHEN   TO CALL YOUR DOCTOR: 1. Fever over 101.0 2. Inability to urinate 3. Continued bleeding from incision. 4. Increased pain, redness, or drainage from the incision. 5. Increasing abdominal pain  The clinic staff is available to answer your questions during regular business hours.  Please don't  hesitate to call and ask to speak to one of the nurses for clinical concerns.  If you have a medical emergency, go to the nearest emergency room or call 911.  A surgeon from [facility] is always on call at the hospital. [address], [address], [place], [place]  [address] ? [address] [address], , [place]   [address] [phone] ? [phone] ? FAX [phone] Web site: [email or web address]  

## 2020-11-12 NOTE — Progress Notes (Signed)
[facility] Progress Note  1 Day Post-Op  Subjective: CC:  C/o of significant abdominal and mid-chest pain. Pain is worse with movement/deep inspiration. No relieved by 5 mg oxycodone or morphine. Denies  Flatus or Bm yet. Denies nausea or emesis. Urinating without issues.   Objective: Vital signs in last 24 hours: Temp:  [97.5 F (36.4 C)-98.6 F (37 C)] 97.8 F (36.6 C) (03/11 0912) Pulse Rate:  [63-92] 84 (03/11 0912) Resp:  [13-19] 17 (03/11 0912) BP: (104-165)/(74-95) 104/76 (03/11 0912) SpO2:  [98 %-100 %] 100 % (03/11 0912) Weight:  [70.3 kg] 70.3 kg (03/10 1236) Last BM Date: 11/10/20 (per patient)  Intake/Output from previous day: 03/10 0701 - 03/11 0700 In: 1000 [I.V.:900; IV Piggyback:100] Out: 5 [Blood:5] Intake/Output this shift: No intake/output data recorded.  PE: Gen:  Alert, NAD, pleasant Card:  Regular rate and rhythm, pedal pulses 2+ BL Pulm:  Normal effort, clear to auscultation bilaterally Abd: Soft, appropriately tender, mild distention, incisions c/d/i without cellulitis or drainage. Skin: warm and dry, no rashes  Psych: A&Ox3   Lab Results:  Recent Labs    11/11/20 0443 11/12/20 0455  WBC 6.9 8.3  HGB 14.0 13.0  HCT 43.3 40.9  PLT 251 254   BMET Recent Labs    11/10/20 0415 11/11/20 0443  NA 140 138  K 4.0 4.0  CL 104 100  CO2 24 27  GLUCOSE 120* 113*  BUN 19 19  CREATININE 1.02 0.95  CALCIUM 9.4 9.4   PT/INR No results for input(s): LABPROT, INR in the last 72 hours. CMP     Component Value Date/Time   NA 138 11/11/2020 0443   K 4.0 11/11/2020 0443   CL 100 11/11/2020 0443   CO2 27 11/11/2020 0443   GLUCOSE 113 (H) 11/11/2020 0443   BUN 19 11/11/2020 0443   CREATININE 0.95 11/11/2020 0443   CALCIUM 9.4 11/11/2020 0443   PROT 7.4 11/12/2020 0455   ALBUMIN 4.1 11/12/2020 0455   AST 78 (H) 11/12/2020 0455   ALT 453 (H) 11/12/2020 0455   ALKPHOS 298 (H) 11/12/2020 0455   BILITOT 0.6 11/12/2020 0455    GFRNONAA >60 11/11/2020 0443   GFRAA >60 03/02/2020 0230   Lipase  No results found for: LIPASE     Studies/Results: DG Cholangiogram Operative  Result Date: 11/11/2020 CLINICAL DATA:  60 year old male with history of acute cholecystitis. EXAM: INTRAOPERATIVE CHOLANGIOGRAM TECHNIQUE: Cholangiographic images from the C-arm fluoroscopic device were submitted for interpretation post-operatively. Please see the procedural report for the amount of contrast and the fluoroscopy time utilized. COMPARISON:  11/06/2020 FINDINGS: Intraoperative antegrade injection via the cystic duct which opacifies the common bile duct and central portions of the intrahepatic biliary tree. Contrast is visualized flowing freely into the duodenum. There are no filling defects. No significant intra or extrahepatic biliary ductal dilation. No apparent anomalous anatomical configuration of the biliary tree. IMPRESSION: No evidence of choledocholithiasis. Marliss Coots, MD [facility] Radiology Electronically Signed   By: Marliss Coots MD   On: 11/11/2020 14:27    Anti-infectives: Anti-infectives (From admission, onward)   Start     Dose/Rate Route Frequency Ordered Stop   11/11/20 1332  sodium chloride 0.9 % with cefTRIAXone (ROCEPHIN) ADS Med       Note to Pharmacy: Vevelyn Royals  : cabinet override      11/11/20 1332 11/12/20 0144   11/07/20 0600  cefTRIAXone (ROCEPHIN) 2 g in sodium chloride 0.9 % 100 mL  IVPB  Status:  Discontinued        2 g 200 mL/hr over 30 Minutes Intravenous Every 24 hours 11/06/20 1001 11/11/20 1600   11/06/20 0615  cefTRIAXone (ROCEPHIN) 2 g in sodium chloride 0.9 % 100 mL IVPB        2 g 200 mL/hr over 30 Minutes Intravenous  Once 11/06/20 9326 11/06/20 0703     Assessment/Plan CAD-on Plavix, held  Hypertension Diabetes  Chronic cholecystitis  S/p laparoscopic cholecystectomy 11/11/20 Dr. Daphine Deutscher - POD#1, AFVSS, LFTs down-trending,  CBC stable - no acute surgical complications. having a lot of post-operative pain, along with some chest pain. I adjusted his PO pain meds. First high sensitivity troponin WNL. CXR WNL.  - FLD, advance as tolerated to heart healthy - pt can likely resume plavix tomorrow, would not re-start today. Will confirm with Dr. Daphine Deutscher.     LOS: 6 days    Hosie Spangle, [facility] Surgery Please see Amion for pager number during day hours 7:00am-4:30pm

## 2020-11-12 NOTE — Progress Notes (Signed)
Patient c/o of discomfort in abdomen, chest and shoulder.  Pain medication given at 0502 and 0522 (see MAR)   Patient concerned/anxious that pain could be related to heart. EKG performed and results read normal EKG and sinus rhythm  Patient's vitals obtained (see below) Patient appears anxious, but refuses his anxiety medication.  11/12/20 0626  Vitals  BP (!) 149/74  MAP (mmHg) 98  BP Location Right Arm  BP Method Automatic  Patient Position (if appropriate) Lying  Pulse Rate 63  Pulse Rate Source Monitor  Resp 18  MEWS COLOR  MEWS Score Color Green  Oxygen Therapy  SpO2 99 %  O2 Device Room [id number]

## 2020-11-12 NOTE — Progress Notes (Signed)
PROGRESS NOTE    Alexander Calderon  WGY:[id number] DOB: 09/15/1960 DOA: 11/06/2020 PCP: Jefm Petty, MD    Chief Complaint  Patient presents with  . Abdominal Pain    Brief Narrative:  60 year old gentleman prior history of coronary artery disease on aspirin, Plavix, type 2 diabetes, hypertension, hyperlipidemia admitted for abdominal pain he was found to have acute cholecystitis. General surgery consulted and he scheduled for lap cholecystectomy on 11/10/2020. Pt seen and examined at bedside. No new complaints. Pt denies any nausea, vomiting or abdominal pain.   Assessment & Plan:   Active Problems:   Cholecystitis   Pain   Acute cholecystitis Underwent  lap cholecystectomy with intra operative cholangiogram on 11/11/2020, . restart Plavix tomorrow  continue symptomatic management with antiemetics, IV fluids and IV antibiotics. His liver enzymes are improving. Alk phos is 349.  Patient reports abdominal pain secondary to gas use distention. Pain control, simethicone and recommended to ambulate as tolerated    History of coronary artery disease s/p multiple stents Patient at home on aspirin and Plavix, statin. Resume Plavix tomorrow Statin on hold for elevated liver enzymes.   Essential hypertension Blood pressure parameters appear to be optimal no changes in medications    Type 2 diabetes mellitus CBG (last 3)  Recent Labs    11/11/20 2103 11/12/20 0734 11/12/20 1148  GLUCAP 270* 131* 204*   CBGs slightly elevated, continue with sliding scale insulin A1c at 6.9.   Hyperlipidemia Holding statins for elevated liver enzymes.    GERD Stable on PPI.  Some chest tightness Nonradiating, worsening with deep inspiration Patient reports he was not able to pass gas/flatulence EKG shows normal sinus rhythm, troponin is negative Improvement with simethicone Chest x-ray is negative for acute cardiopulmonary disease   DVT prophylaxis: (Lovenox) Code Status:  (Full code) Family Communication: Family at bedside Disposition:   Status is: Inpatient  Remains inpatient appropriate because:Ongoing diagnostic testing needed not appropriate for outpatient work up and IV treatments appropriate due to intensity of illness or inability to take PO   Dispo: The patient is from: Home              Anticipated d/c is to: Home              Patient currently is not medically stable to d/c.   Difficult to place patient No       Consultants:   Surgery.    Procedures:  Scheduled for lap chol on 3/9  Antimicrobials:  Antibiotics Given (last 72 hours)    Date/Time Action Medication Dose Rate   11/10/20 0547 New Bag/Given   cefTRIAXone (ROCEPHIN) 2 g in sodium chloride 0.9 % 100 mL IVPB 2 g 200 mL/hr   11/11/20 0540 New Bag/Given   cefTRIAXone (ROCEPHIN) 2 g in sodium chloride 0.9 % 100 mL IVPB 2 g 200 mL/hr         Subjective: No nausea or vomiting, abdominal pain from gas, constipation Objective: Vitals:   11/12/20 0441 11/12/20 0626 11/12/20 0912 11/12/20 1434  BP: (!) 164/77 (!) 149/74 104/76 110/78  Pulse: 63 63 84 77  Resp:  $Remo'18 17 16  'Iierd$ Temp:   97.8 F (36.6 C) (!) 97.4 F (36.3 C)  TempSrc:    Oral  SpO2:  99% 100% 100%  Weight:      Height:       No intake or output data in the 24 hours ending 11/12/20 1703 Filed Weights   11/06/20 0429 11/06/20 1158 11/11/20 1236  Weight: 70.3 kg 70.3 kg 70.3 kg    Examination:  General exam: Alert and oriented, no distress noted Respiratory system: Air entry fair bilaterally, no wheezing or rhonchi Cardiovascular system: S S1-S2 heard, regular rate rhythm, no pedal edema Gastrointestinal system: Abdomen is soft, nondistended, mild generalized tenderness, incision sites appear clean, bowel sounds  Hyperactive  Central nervous system: Alert and oriented, grossly nonfocal Extremities: No pedal edema Skin: Incision sites on the abdomen looks clean Psychiatry: Anxious    Data Reviewed:  I have personally reviewed following labs and imaging studies  CBC: Recent Labs  Lab 11/06/20 0444 11/06/20 1229 11/08/20 0433 11/09/20 0444 11/10/20 0415 11/11/20 0443 11/12/20 0455  WBC 10.1   < > 4.3 5.7 6.3 6.9 8.3  NEUTROABS 8.0*  --   --   --   --   --   --   HGB 12.6*   < > 12.0* 12.6* 13.1 14.0 13.0  HCT 38.0*   < > 36.9* 38.0* 41.1 43.3 40.9  MCV 85.6   < > 87.2 85.6 86.3 87.1 87.6  PLT 223   < > 179 199 236 251 254   < > = values in this interval not displayed.    Basic Metabolic Panel: Recent Labs  Lab 11/07/20 0353 11/08/20 0433 11/09/20 0444 11/10/20 0415 11/11/20 0443  NA 137 138 138 140 138  K 4.5 3.6 4.0 4.0 4.0  CL 100 103 104 104 100  CO2 $Re'29 25 27 24 27  'Ccb$ GLUCOSE 183* 130* 159* 120* 113*  BUN $Re'15 13 18 19 19  'lNv$ CREATININE 0.89 0.89 0.93 1.02 0.95  CALCIUM 8.8* 8.9 9.1 9.4 9.4    GFR: Estimated Creatinine Clearance: 73.7 mL/min (by C-G formula based on SCr of 0.95 mg/dL).  Liver Function Tests: Recent Labs  Lab 11/08/20 0433 11/09/20 0444 11/10/20 0415 11/11/20 0443 11/12/20 0455  AST 1,034* 220* 72* 81* 78*  ALT 2,078* 1,317* 855* 619* 453*  ALKPHOS 398* 405* 364* 349* 298*  BILITOT 1.9* 1.2 1.0 1.1 0.6  PROT 6.6 6.6 6.8 7.4 7.4  ALBUMIN 3.6 3.7 3.7 4.2 4.1    CBG: Recent Labs  Lab 11/11/20 1632 11/11/20 1738 11/11/20 2103 11/12/20 0734 11/12/20 1148  GLUCAP 171* 219* 270* 131* 204*     Recent Results (from the past 240 hour(s))  Resp Panel by RT-PCR (Flu A&B, Covid) Nasopharyngeal Swab     Status: None   Collection Time: 11/06/20  6:25 AM   Specimen: Nasopharyngeal Swab; Nasopharyngeal(NP) swabs in vial transport medium  Result Value Ref Range Status   SARS Coronavirus 2 by RT PCR NEGATIVE NEGATIVE Final    Comment: (NOTE) SARS-CoV-2 target nucleic acids are NOT DETECTED.  The SARS-CoV-2 RNA is generally detectable in upper respiratory specimens during the acute phase of infection. The lowest concentration of SARS-CoV-2  viral copies this assay can detect is 138 copies/mL. A negative result does not preclude SARS-Cov-2 infection and should not be used as the sole basis for treatment or other patient management decisions. A negative result may occur with  improper specimen collection/handling, submission of specimen other than nasopharyngeal swab, presence of viral mutation(s) within the areas targeted by this assay, and inadequate number of viral copies(<138 copies/mL). A negative result must be combined with clinical observations, patient history, and epidemiological information. The expected result is Negative.  Fact Sheet for Patients:  EntrepreneurPulse.com.au  Fact Sheet for Healthcare Providers:  IncredibleEmployment.be  This test is no t yet approved or cleared by the [place]  States FDA and  has been authorized for detection and/or diagnosis of SARS-CoV-2 by FDA under an Emergency Use Authorization (EUA). This EUA will remain  in effect (meaning this test can be used) for the duration of the COVID-19 declaration under Section 564(b)(1) of the Act, 21 U.S.C.section 360bbb-3(b)(1), unless the authorization is terminated  or revoked sooner.       Influenza A by PCR NEGATIVE NEGATIVE Final   Influenza B by PCR NEGATIVE NEGATIVE Final    Comment: (NOTE) The Xpert Xpress SARS-CoV-2/FLU/RSV plus assay is intended as an aid in the diagnosis of influenza from Nasopharyngeal swab specimens and should not be used as a sole basis for treatment. Nasal washings and aspirates are unacceptable for Xpert Xpress SARS-CoV-2/FLU/RSV testing.  Fact Sheet for Patients: EntrepreneurPulse.com.au  Fact Sheet for Healthcare Providers: IncredibleEmployment.be  This test is not yet approved or cleared by the [place] FDA and has been authorized for detection and/or diagnosis of SARS-CoV-2 by FDA under an Emergency Use Authorization  (EUA). This EUA will remain in effect (meaning this test can be used) for the duration of the COVID-19 declaration under Section 564(b)(1) of the Act, 21 U.S.C. section 360bbb-3(b)(1), unless the authorization is terminated or revoked.  Performed at [facility], [address], [place], [place] [address]   Surgical pcr screen     Status: None   Collection Time: 11/09/20  6:03 AM   Specimen: Nasal Mucosa; Nasal Swab  Result Value Ref Range Status   MRSA, PCR NEGATIVE NEGATIVE Final   Staphylococcus aureus NEGATIVE NEGATIVE Final    Comment: (NOTE) The Xpert SA Assay (FDA approved for NASAL specimens in patients 28 years of age and older), is one component of a comprehensive surveillance program. It is not intended to diagnose infection nor to guide or monitor treatment. Performed at [facility], [place] [address] [place], [place] [address]          Radiology Studies: DG Cholangiogram Operative  Result Date: 11/11/2020 CLINICAL DATA:  60 year old male with history of acute cholecystitis. EXAM: INTRAOPERATIVE CHOLANGIOGRAM TECHNIQUE: Cholangiographic images from the C-arm fluoroscopic device were submitted for interpretation post-operatively. Please see the procedural report for the amount of contrast and the fluoroscopy time utilized. COMPARISON:  11/06/2020 FINDINGS: Intraoperative antegrade injection via the cystic duct which opacifies the common bile duct and central portions of the intrahepatic biliary tree. Contrast is visualized flowing freely into the duodenum. There are no filling defects. No significant intra or extrahepatic biliary ductal dilation. No apparent anomalous anatomical configuration of the biliary tree. IMPRESSION: No evidence of choledocholithiasis. Ruthann Cancer, MD [facility] Radiology Electronically Signed   By: Ruthann Cancer MD   On: 11/11/2020 14:27   DG CHEST PORT 1  VIEW  Result Date: 11/12/2020 CLINICAL DATA:  Chest discomfort EXAM: PORTABLE CHEST 1 VIEW COMPARISON:  11/06/2020 FINDINGS: The heart size and mediastinal contours are within normal limits. Right coronary stent. Both lungs are clear. The visualized skeletal structures are unremarkable. IMPRESSION: No active disease. Electronically Signed   By: Franchot Gallo M.D.   On: 11/12/2020 11:51        Scheduled Meds: . acetaminophen  1,000 mg Oral Q6H  . buPROPion  450 mg Oral q morning  . docusate sodium  100 mg Oral BID  . enoxaparin (LOVENOX) injection  40 mg Subcutaneous Q24H  . insulin aspart  0-20 Units Subcutaneous TID WC  . insulin aspart  0-5 Units Subcutaneous QHS  .  lidocaine  1 patch Transdermal Daily  . methocarbamol  750 mg Oral TID  . mirtazapine  45 mg Oral QHS  . montelukast  10 mg Oral Daily  . pantoprazole  40 mg Oral Daily   Continuous Infusions:    LOS: 6 days        Hosie Poisson, MD [facility]   To contact the attending provider between 7A-7P or the covering provider during after hours 7P-7A, please log into the web site [email or web address] and access using universal [place] password for that web site. If you do not have the password, please call the hospital operator.  11/12/2020, 5:03 PM

## 2020-11-13 ENCOUNTER — Inpatient Hospital Stay (HOSPITAL_COMMUNITY): Payer: Federal, State, Local not specified - PPO

## 2020-11-13 LAB — GLUCOSE, CAPILLARY
Glucose-Capillary: 137 mg/dL — ABNORMAL HIGH (ref 70–99)
Glucose-Capillary: 149 mg/dL — ABNORMAL HIGH (ref 70–99)
Glucose-Capillary: 220 mg/dL — ABNORMAL HIGH (ref 70–99)
Glucose-Capillary: 198 mg/dL — ABNORMAL HIGH (ref 70–99)

## 2020-11-13 LAB — CREATININE, SERUM
Creatinine, Ser: 1.1 mg/dL (ref 0.61–1.24)
GFR, Estimated: >60 mL/min (ref >60)

## 2020-11-13 MED ORDER — LIP MEDEX EX OINT
1.0000 "application " | TOPICAL_OINTMENT | Freq: Two times a day (BID) | CUTANEOUS | Status: DC
  Filled 2020-11-13 (×2): qty 7

## 2020-11-13 MED ORDER — IBUPROFEN 800 MG PO TABS: 800.0000 mg | ORAL_TABLET | Freq: Three times a day (TID) | ORAL | Status: DC

## 2020-11-13 MED ORDER — LACTATED RINGERS IV BOLUS: 1000.0000 mL | Freq: Three times a day (TID) | INTRAVENOUS | Status: DC | PRN

## 2020-11-13 MED ORDER — PROCHLORPERAZINE EDISYLATE 10 MG/2ML IJ SOLN: 5.0000 mg | INTRAMUSCULAR | Status: DC | PRN

## 2020-11-13 MED ORDER — HYDROCOD POLST-CPM POLST ER 10-8 MG/5ML PO SUER: 5.0000 mL | Freq: Two times a day (BID) | ORAL | Status: DC | PRN

## 2020-11-13 MED ORDER — DIPHENHYDRAMINE HCL 50 MG/ML IJ SOLN: 12.5000 mg | Freq: Four times a day (QID) | INTRAMUSCULAR | Status: DC | PRN

## 2020-11-13 MED ORDER — FAMOTIDINE 20 MG PO TABS
40.0000 mg | ORAL_TABLET | Freq: Every day | ORAL | Status: DC
  Administered 2020-11-13 – 2020-11-15 (×3): 40 mg via ORAL
  Filled 2020-11-13 (×3): qty 2

## 2020-11-13 MED ORDER — SODIUM CHLORIDE 0.9% FLUSH: 3.0000 mL | INTRAVENOUS | Status: DC | PRN

## 2020-11-13 MED ORDER — GUAIFENESIN-DM 100-10 MG/5ML PO SYRP: 10.0000 mL | ORAL_SOLUTION | ORAL | Status: DC | PRN

## 2020-11-13 MED ORDER — GABAPENTIN 300 MG PO CAPS
300.0000 mg | ORAL_CAPSULE | Freq: Three times a day (TID) | ORAL | Status: DC
  Administered 2020-11-13 – 2020-11-14 (×6): 300 mg via ORAL
  Filled 2020-11-13 (×7): qty 1

## 2020-11-13 MED ORDER — METHOCARBAMOL 500 MG PO TABS
1000.0000 mg | ORAL_TABLET | Freq: Three times a day (TID) | ORAL | Status: DC
  Administered 2020-11-13 – 2020-11-14 (×4): 1000 mg via ORAL
  Filled 2020-11-13 (×5): qty 2

## 2020-11-13 MED ORDER — HYDROMORPHONE HCL 1 MG/ML IJ SOLN
0.5000 mg | INTRAMUSCULAR | Status: DC | PRN
Start: 2020-11-13 — End: 2020-11-15

## 2020-11-13 MED ORDER — IBUPROFEN 200 MG PO TABS: 600.0000 mg | ORAL_TABLET | Freq: Four times a day (QID) | ORAL | Status: DC | PRN

## 2020-11-13 MED ORDER — CALCIUM POLYCARBOPHIL 625 MG PO TABS
625.0000 mg | ORAL_TABLET | Freq: Two times a day (BID) | ORAL | Status: DC
  Administered 2020-11-13 – 2020-11-15 (×4): 625 mg via ORAL
  Filled 2020-11-13 (×4): qty 1

## 2020-11-13 MED ORDER — MAGIC MOUTHWASH
15.0000 mL | Freq: Four times a day (QID) | ORAL | Status: DC | PRN
  Filled 2020-11-13: qty 15

## 2020-11-13 MED ORDER — ACETAMINOPHEN 500 MG PO TABS
1000.0000 mg | ORAL_TABLET | Freq: Four times a day (QID) | ORAL | Status: DC
  Administered 2020-11-13 – 2020-11-14 (×3): 1000 mg via ORAL
  Filled 2020-11-13 (×5): qty 2

## 2020-11-13 MED ORDER — CITALOPRAM HYDROBROMIDE 20 MG PO TABS
40.0000 mg | ORAL_TABLET | Freq: Every day | ORAL | Status: DC | PRN
Start: 2020-11-13 — End: 2020-11-15

## 2020-11-13 MED ORDER — POLYETHYLENE GLYCOL 3350 17 G PO PACK
17.0000 g | PACK | Freq: Every day | ORAL | Status: DC
  Administered 2020-11-14: 17 g via ORAL
  Filled 2020-11-13: qty 1

## 2020-11-13 MED ORDER — HYOSCYAMINE SULFATE ER 0.375 MG PO TB12
0.3750 mg | ORAL_TABLET | Freq: Two times a day (BID) | ORAL | Status: DC
Start: 2020-11-13 — End: 2020-11-15
  Administered 2020-11-13 – 2020-11-15 (×4): 0.375 mg via ORAL
  Filled 2020-11-13 (×5): qty 1

## 2020-11-13 MED ORDER — SENNOSIDES-DOCUSATE SODIUM 8.6-50 MG PO TABS
2.0000 | ORAL_TABLET | Freq: Two times a day (BID) | ORAL | Status: DC
  Administered 2020-11-13 – 2020-11-15 (×4): 2 via ORAL
  Filled 2020-11-13 (×4): qty 2

## 2020-11-13 MED ORDER — HYDROCORTISONE 1 % EX CREA
1.0000 "application " | TOPICAL_CREAM | Freq: Three times a day (TID) | CUTANEOUS | Status: DC | PRN
  Filled 2020-11-13: qty 28

## 2020-11-13 MED ORDER — PHENOL 1.4 % MT LIQD: 1.0000 | OROMUCOSAL | Status: DC | PRN

## 2020-11-13 MED ORDER — BISACODYL 10 MG RE SUPP: 10.0000 mg | Freq: Two times a day (BID) | RECTAL | Status: DC | PRN

## 2020-11-13 MED ORDER — ALUM & MAG HYDROXIDE-SIMETH 200-200-20 MG/5ML PO SUSP: 30.0000 mL | Freq: Four times a day (QID) | ORAL | Status: DC | PRN

## 2020-11-13 MED ORDER — SODIUM CHLORIDE 0.9% FLUSH
3.0000 mL | Freq: Two times a day (BID) | INTRAVENOUS | Status: DC
  Administered 2020-11-13: 3 mL via INTRAVENOUS

## 2020-11-13 MED ORDER — SODIUM CHLORIDE 0.9 % IV SOLN: 250.0000 mL | INTRAVENOUS | Status: DC | PRN

## 2020-11-13 MED ORDER — HYDROCORTISONE (PERIANAL) 2.5 % EX CREA
1.0000 "application " | TOPICAL_CREAM | Freq: Four times a day (QID) | CUTANEOUS | Status: DC | PRN
  Filled 2020-11-13: qty 28.35

## 2020-11-13 MED ORDER — MENTHOL 3 MG MT LOZG: 1.0000 | LOZENGE | OROMUCOSAL | Status: DC | PRN

## 2020-11-13 NOTE — Progress Notes (Signed)
PROGRESS NOTE    Alexander Calderon  IDP:[id number] DOB: 21-Jan-1961 DOA: 11/06/2020 PCP: Jefm Petty, MD    Chief Complaint  Patient presents with  . Abdominal Pain    Brief Narrative:  60 year old gentleman prior history of coronary artery disease on aspirin, Plavix, type 2 diabetes, hypertension, hyperlipidemia admitted for abdominal pain he was found to have acute cholecystitis. General surgery consulted and he scheduled for lap cholecystectomy on 11/10/2020. Pt seen and examined at bedside,  Pt reports no flatulence, some abdominal pain requiring IV pain meds.  No nausea or vomiting.   Assessment & Plan:   Active Problems:   Cholecystitis   Pain   Acute cholecystitis Underwent  lap cholecystectomy with intra operative cholangiogram on 11/11/2020, . restart Plavix tomorrow as per general surgery.  His liver enzymes are improving. Alk phos is 349.  Patient reports abdominal pain secondary to gas, .  Pain control, simethicone and recommended to ambulate as tolerated. No improvement with simethicone, ordered abdominal film    History of coronary artery disease s/p multiple stents Patient at home on aspirin and Plavix, statin. Resume Plavix tomorrow Statin on hold for elevated liver enzymes.   Essential hypertension Blood pressure parameters appear to be optimal    Type 2 diabetes mellitus CBG (last 3)  Recent Labs    11/12/20 2125 11/13/20 0744 11/13/20 1150  GLUCAP 153* 137* 149*   CBGs slightly elevated, continue with sliding scale insulin, no changes in medications A1c at 6.9.   Hyperlipidemia Holding statins for elevated liver enzymes.    GERD Stable on PPI.  Some chest tightness Nonradiating, worsening with deep inspiration Patient reports he was not able to pass gas/flatulence EKG shows normal sinus rhythm, troponin is negative Improvement with simethicone Chest x-ray is negative for acute cardiopulmonary disease Currently patient denies any  chest pain or shortness of breath   DVT prophylaxis: (Lovenox) Code Status: (Full code) Family Communication: None at bedside Disposition:   Status is: Inpatient  Remains inpatient appropriate because:Ongoing diagnostic testing needed not appropriate for outpatient work up and IV treatments appropriate due to intensity of illness or inability to take PO   Dispo: The patient is from: Home              Anticipated d/c is to: Home              Patient currently is not medically stable to d/c.   Difficult to place patient No       Consultants:   Surgery.    Procedures:  Scheduled for lap chol on 3/9  Antimicrobials:  Antibiotics Given (last 72 hours)    Date/Time Action Medication Dose Rate   11/11/20 0540 New Bag/Given   cefTRIAXone (ROCEPHIN) 2 g in sodium chloride 0.9 % 100 mL IVPB 2 g 200 mL/hr         Subjective: No nausea or vomiting, patient reports abdominal discomfort and pain from gas, no bowel movement yet.  Vitals:   11/12/20 1434 11/12/20 2126 11/13/20 0506 11/13/20 1213  BP: 110/78 119/89 108/65 119/73  Pulse: 77 88 84 89  Resp: $Remo'16 14 16 15  'oMryV$ Temp: (!) 97.4 F (36.3 C) (!) 97.4 F (36.3 C) 97.8 F (36.6 C) 98.3 F (36.8 C)  TempSrc: Oral Oral Oral Oral  SpO2: 100% 100% 100% 98%  Weight:      Height:        Intake/Output Summary (Last 24 hours) at 11/13/2020 1344 Last data filed at 11/12/2020 1800 Gross per 24  hour  Intake 360 ml  Output --  Net 360 ml   Filed Weights   11/06/20 0429 11/06/20 1158 11/11/20 1236  Weight: 70.3 kg 70.3 kg 70.3 kg    Examination:  General exam: Alert and comfortable no distress noted Respiratory system: Clear to auscultation bilaterally, no wheezing or rhonchi Cardiovascular system: S1-S2 2 heard, regular rate rhythm, no JVD Gastrointestinal system:  abdomen is soft, distended, mild generalized tenderness, hyperactive bowel movements  Central nervous system: Alert and oriented, grossly  nonfocal Extremities: No pedal edema Skin: Incision sites on the abdomen looks clean Psychiatry: Mood is appropriate    Data Reviewed: I have personally reviewed following labs and imaging studies  CBC: Recent Labs  Lab 11/08/20 0433 11/09/20 0444 11/10/20 0415 11/11/20 0443 11/12/20 0455  WBC 4.3 5.7 6.3 6.9 8.3  HGB 12.0* 12.6* 13.1 14.0 13.0  HCT 36.9* 38.0* 41.1 43.3 40.9  MCV 87.2 85.6 86.3 87.1 87.6  PLT 179 199 236 251 580    Basic Metabolic Panel: Recent Labs  Lab 11/07/20 0353 11/08/20 0433 11/09/20 0444 11/10/20 0415 11/11/20 0443 11/13/20 0359  NA 137 138 138 140 138  --   K 4.5 3.6 4.0 4.0 4.0  --   CL 100 103 104 104 100  --   CO2 $Re'29 25 27 24 27  'Ble$ --   GLUCOSE 183* 130* 159* 120* 113*  --   BUN $Re'15 13 18 19 19  'kuL$ --   CREATININE 0.89 0.89 0.93 1.02 0.95 1.10  CALCIUM 8.8* 8.9 9.1 9.4 9.4  --     GFR: Estimated Creatinine Clearance: 63.7 mL/min (by C-G formula based on SCr of 1.1 mg/dL).  Liver Function Tests: Recent Labs  Lab 11/08/20 0433 11/09/20 0444 11/10/20 0415 11/11/20 0443 11/12/20 0455  AST 1,034* 220* 72* 81* 78*  ALT 2,078* 1,317* 855* 619* 453*  ALKPHOS 398* 405* 364* 349* 298*  BILITOT 1.9* 1.2 1.0 1.1 0.6  PROT 6.6 6.6 6.8 7.4 7.4  ALBUMIN 3.6 3.7 3.7 4.2 4.1    CBG: Recent Labs  Lab 11/12/20 1148 11/12/20 1716 11/12/20 2125 11/13/20 0744 11/13/20 1150  GLUCAP 204* 206* 153* 137* 149*     Recent Results (from the past 240 hour(s))  Resp Panel by RT-PCR (Flu A&B, Covid) Nasopharyngeal Swab     Status: None   Collection Time: 11/06/20  6:25 AM   Specimen: Nasopharyngeal Swab; Nasopharyngeal(NP) swabs in vial transport medium  Result Value Ref Range Status   SARS Coronavirus 2 by RT PCR NEGATIVE NEGATIVE Final    Comment: (NOTE) SARS-CoV-2 target nucleic acids are NOT DETECTED.  The SARS-CoV-2 RNA is generally detectable in upper respiratory specimens during the acute phase of infection. The lowest concentration  of SARS-CoV-2 viral copies this assay can detect is 138 copies/mL. A negative result does not preclude SARS-Cov-2 infection and should not be used as the sole basis for treatment or other patient management decisions. A negative result may occur with  improper specimen collection/handling, submission of specimen other than nasopharyngeal swab, presence of viral mutation(s) within the areas targeted by this assay, and inadequate number of viral copies(<138 copies/mL). A negative result must be combined with clinical observations, patient history, and epidemiological information. The expected result is Negative.  Fact Sheet for Patients:  EntrepreneurPulse.com.au  Fact Sheet for Healthcare Providers:  IncredibleEmployment.be  This test is no t yet approved or cleared by the [place] FDA and  has been authorized for detection and/or diagnosis of SARS-CoV-2 by FDA  under an Emergency Use Authorization (EUA). This EUA will remain  in effect (meaning this test can be used) for the duration of the COVID-19 declaration under Section 564(b)(1) of the Act, 21 U.S.C.section 360bbb-3(b)(1), unless the authorization is terminated  or revoked sooner.       Influenza A by PCR NEGATIVE NEGATIVE Final   Influenza B by PCR NEGATIVE NEGATIVE Final    Comment: (NOTE) The Xpert Xpress SARS-CoV-2/FLU/RSV plus assay is intended as an aid in the diagnosis of influenza from Nasopharyngeal swab specimens and should not be used as a sole basis for treatment. Nasal washings and aspirates are unacceptable for Xpert Xpress SARS-CoV-2/FLU/RSV testing.  Fact Sheet for Patients: EntrepreneurPulse.com.au  Fact Sheet for Healthcare Providers: IncredibleEmployment.be  This test is not yet approved or cleared by the [place] FDA and has been authorized for detection and/or diagnosis of SARS-CoV-2 by FDA under an Emergency Use  Authorization (EUA). This EUA will remain in effect (meaning this test can be used) for the duration of the COVID-19 declaration under Section 564(b)(1) of the Act, 21 U.S.C. section 360bbb-3(b)(1), unless the authorization is terminated or revoked.  Performed at [facility], [address], [place], [place] [address]   Surgical pcr screen     Status: None   Collection Time: 11/09/20  6:03 AM   Specimen: Nasal Mucosa; Nasal Swab  Result Value Ref Range Status   MRSA, PCR NEGATIVE NEGATIVE Final   Staphylococcus aureus NEGATIVE NEGATIVE Final    Comment: (NOTE) The Xpert SA Assay (FDA approved for NASAL specimens in patients 72 years of age and older), is one component of a comprehensive surveillance program. It is not intended to diagnose infection nor to guide or monitor treatment. Performed at [facility], [place] [address] [place], [place] [address]          Radiology Studies: DG Cholangiogram Operative  Result Date: 11/11/2020 CLINICAL DATA:  60 year old male with history of acute cholecystitis. EXAM: INTRAOPERATIVE CHOLANGIOGRAM TECHNIQUE: Cholangiographic images from the C-arm fluoroscopic device were submitted for interpretation post-operatively. Please see the procedural report for the amount of contrast and the fluoroscopy time utilized. COMPARISON:  11/06/2020 FINDINGS: Intraoperative antegrade injection via the cystic duct which opacifies the common bile duct and central portions of the intrahepatic biliary tree. Contrast is visualized flowing freely into the duodenum. There are no filling defects. No significant intra or extrahepatic biliary ductal dilation. No apparent anomalous anatomical configuration of the biliary tree. IMPRESSION: No evidence of choledocholithiasis. Ruthann Cancer, MD [facility] Radiology Electronically Signed   By: Ruthann Cancer MD   On: 11/11/2020 14:27   DG CHEST PORT  1 VIEW  Result Date: 11/12/2020 CLINICAL DATA:  Chest discomfort EXAM: PORTABLE CHEST 1 VIEW COMPARISON:  11/06/2020 FINDINGS: The heart size and mediastinal contours are within normal limits. Right coronary stent. Both lungs are clear. The visualized skeletal structures are unremarkable. IMPRESSION: No active disease. Electronically Signed   By: Franchot Gallo M.D.   On: 11/12/2020 11:51        Scheduled Meds: . buPROPion  450 mg Oral q morning  . docusate sodium  100 mg Oral BID  . enoxaparin (LOVENOX) injection  40 mg Subcutaneous Q24H  . gabapentin  300 mg Oral TID  . insulin aspart  0-20 Units Subcutaneous TID WC  . insulin aspart  0-5 Units Subcutaneous QHS  . lidocaine  1 patch Transdermal Daily  . methocarbamol  750 mg Oral  TID  . mirtazapine  45 mg Oral QHS  . montelukast  10 mg Oral Daily  . pantoprazole  40 mg Oral Daily   Continuous Infusions:    LOS: 7 days        Hosie Poisson, MD [facility]   To contact the attending provider between 7A-7P or the covering provider during after hours 7P-7A, please log into the web site [email or web address] and access using universal [place] password for that web site. If you do not have the password, please call the hospital operator.  11/13/2020, 1:44 PM

## 2020-11-13 NOTE — Progress Notes (Signed)
Pt ambulate x 5 around unit. Encouraged H2O intake. No BM. MD notified. Will continue to monitor.

## 2020-11-13 NOTE — Progress Notes (Signed)
2 Days Post-Op   Subjective/Chief Complaint: Patient complains of abdominal pain.  Somewhat better than yesterday but worse when he stands up.  Denies excessive alcohol or any narcotic use at home.   Objective: Vital signs in last 24 hours: Temp:  [97.4 F (36.3 C)-97.8 F (36.6 C)] 97.8 F (36.6 C) (03/12 0506) Pulse Rate:  [77-88] 84 (03/12 0506) Resp:  [14-16] 16 (03/12 0506) BP: (108-119)/(65-89) 108/65 (03/12 0506) SpO2:  [100 %] 100 % (03/12 0506) Last BM Date: 11/10/20  Intake/Output from previous day: 03/11 0701 - 03/12 0700 In: 717 [P.O.:717] Out: -  Intake/Output this shift: No intake/output data recorded.   Gen:  Alert, NAD, pleasant Card:  Regular rate and rhythm, pedal pulses 2+ BL Pulm:  Normal effort, clear to auscultation bilaterally Abd: Soft, appropriately tender, mild distention, incisions c/d/i without cellulitis or drainage. Skin: warm and dry, no rashes  Psych: A&Ox3  Lab Results:  Recent Labs    11/11/20 0443 11/12/20 0455  WBC 6.9 8.3  HGB 14.0 13.0  HCT 43.3 40.9  PLT 251 254   BMET Recent Labs    11/11/20 0443 11/13/20 0359  NA 138  --   K 4.0  --   CL 100  --   CO2 27  --   GLUCOSE 113*  --   BUN 19  --   CREATININE 0.95 1.10  CALCIUM 9.4  --    PT/INR No results for input(s): LABPROT, INR in the last 72 hours. ABG No results for input(s): PHART, HCO3 in the last 72 hours.  Invalid input(s): PCO2, PO2  Studies/Results: DG Cholangiogram Operative  Result Date: 11/11/2020 CLINICAL DATA:  60 year old male with history of acute cholecystitis. EXAM: INTRAOPERATIVE CHOLANGIOGRAM TECHNIQUE: Cholangiographic images from the C-arm fluoroscopic device were submitted for interpretation post-operatively. Please see the procedural report for the amount of contrast and the fluoroscopy time utilized. COMPARISON:  11/06/2020 FINDINGS: Intraoperative antegrade injection via the cystic duct which opacifies the common bile duct and central  portions of the intrahepatic biliary tree. Contrast is visualized flowing freely into the duodenum. There are no filling defects. No significant intra or extrahepatic biliary ductal dilation. No apparent anomalous anatomical configuration of the biliary tree. IMPRESSION: No evidence of choledocholithiasis. Marliss Coots, MD [facility] Radiology Electronically Signed   By: Marliss Coots MD   On: 11/11/2020 14:27   DG CHEST PORT 1 VIEW  Result Date: 11/12/2020 CLINICAL DATA:  Chest discomfort EXAM: PORTABLE CHEST 1 VIEW COMPARISON:  11/06/2020 FINDINGS: The heart size and mediastinal contours are within normal limits. Right coronary stent. Both lungs are clear. The visualized skeletal structures are unremarkable. IMPRESSION: No active disease. Electronically Signed   By: Marlan Palau M.D.   On: 11/12/2020 11:51    Anti-infectives: Anti-infectives (From admission, onward)   Start     Dose/Rate Route Frequency Ordered Stop   11/11/20 1332  sodium chloride 0.9 % with cefTRIAXone (ROCEPHIN) ADS Med       Note to Pharmacy: Vevelyn Royals  : cabinet override      11/11/20 1332 11/12/20 0144   11/07/20 0600  cefTRIAXone (ROCEPHIN) 2 g in sodium chloride 0.9 % 100 mL IVPB  Status:  Discontinued        2 g 200 mL/hr over 30 Minutes Intravenous Every 24 hours 11/06/20 1001 11/11/20 1600   11/06/20 0615  cefTRIAXone (ROCEPHIN) 2 g in sodium chloride 0.9 % 100 mL IVPB  2 g 200 mL/hr over 30 Minutes Intravenous  Once 11/06/20 4628 11/06/20 0703      Assessment/Plan: s/p Procedure(s): LAPAROSCOPIC CHOLECYSTECTOMY WITH  INTRAOPERATIVE CHOLANGIOGRAM (N/A) CAD-on Plavix, held Hypertension Diabetes  Chronic cholecystitis  S/p laparoscopic cholecystectomy 11/11/20 Dr. Daphine Deutscher - POD#2, AFVSS, - no acute surgical complications. having a lot of post-operative pain, no evidence of peritonitis or bile leak on exam.  Appears to be incisional pain.  We  will add gabapentin 3 mg p.o. 3 times daily and ibuprofen as needed 800 mg.  Can be discharged once pain is manageable. - FLD, advance as tolerated to heart healthy - pt can likely resume plavix tomorrow  LOS: 7 days    Maisie Fus A Hyman Crossan 11/13/2020

## 2020-11-14 DIAGNOSIS — R109 Unspecified abdominal pain: Secondary | ICD-10-CM

## 2020-11-14 LAB — GLUCOSE, CAPILLARY
Glucose-Capillary: 135 mg/dL — ABNORMAL HIGH (ref 70–99)
Glucose-Capillary: 241 mg/dL — ABNORMAL HIGH (ref 70–99)
Glucose-Capillary: 105 mg/dL — ABNORMAL HIGH (ref 70–99)
Glucose-Capillary: 210 mg/dL — ABNORMAL HIGH (ref 70–99)

## 2020-11-14 MED ORDER — POLYETHYLENE GLYCOL 3350 17 G PO PACK
17.0000 g | PACK | Freq: Two times a day (BID) | ORAL | Status: DC
  Administered 2020-11-14 – 2020-11-15 (×2): 17 g via ORAL
  Filled 2020-11-14 (×2): qty 1

## 2020-11-14 NOTE — Progress Notes (Signed)
PROGRESS NOTE    Alexander Calderon  OHY:[id number] DOB: 04-08-1961 DOA: 11/06/2020 PCP: Loyal Jacobson, MD    Chief Complaint  Patient presents with  . Abdominal Pain    Brief Narrative:  60 year old gentleman prior history of coronary artery disease on aspirin, Plavix, type 2 diabetes, hypertension, hyperlipidemia admitted for abdominal pain he was found to have acute cholecystitis. General surgery consulted and he scheduled for lap cholecystectomy on 11/10/2020. Pt seen and examined at bedside, abd pain has improved, but not completely resolved.  No nausea, vomiting. No BM yet, small flatulence. Pt feels he cannot go home today.   Assessment & Plan:   Active Problems:   Cholecystitis   Pain   Acute cholecystitis Underwent  lap cholecystectomy with intra operative cholangiogram on 11/11/2020, . Will start plavix today.  His liver enzymes are improving. No nausea, vomiting, abd pain has improved, but not resolved. abd film shows constipation.     History of coronary artery disease s/p multiple stents Patient at home on aspirin and Plavix, statin. Statin on hold for elevated liver enzymes.   Essential hypertension Blood pressure parameters appear to be normal.     Type 2 diabetes mellitus CBG (last 3)  Recent Labs    11/13/20 2006 11/14/20 0722 11/14/20 1202  GLUCAP 198* 135* 241*   CBGs slightly elevated, continue with SSI.  No changes to meds.  A1c at 6.9.   Hyperlipidemia Holding statins for elevated liver enzymes.    GERD Stable on PPI.  Some chest tightness Nonradiating, worsening with deep inspiration Patient reports he was not able to pass gas/flatulence EKG shows normal sinus rhythm, troponin is negative Improvement with simethicone Chest x-ray is negative for acute cardiopulmonary disease Currently patient denies any chest pain or shortness of breath   DVT prophylaxis: (Lovenox) Code Status: (Full code) Family Communication: None at  bedside Disposition:   Status is: Inpatient  Remains inpatient appropriate because:Ongoing diagnostic testing needed not appropriate for outpatient work up and IV treatments appropriate due to intensity of illness or inability to take PO   Dispo: The patient is from: Home              Anticipated d/c is to: Home              Patient currently is not medically stable to d/c.   Difficult to place patient No       Consultants:   Surgery.    Procedures:  Scheduled for lap chol on 3/9  Antimicrobials:  Antibiotics Given (last 72 hours)    None         Subjective: Pt nausea, vomiting has resolved. abd pain has improved.  No BM, slight flatulence, not ready to go home.   Vitals:   11/13/20 1213 11/13/20 2010 11/14/20 0523 11/14/20 1257  BP: 119/73 115/62 124/81 126/72  Pulse: 89 89 74 100  Resp: 15 18 15 18   Temp: 98.3 F (36.8 C) 98.2 F (36.8 C) 97.6 F (36.4 C) 99 F (37.2 C)  TempSrc: Oral Oral Oral Oral  SpO2: 98% 100% 100% (!) 87%  Weight:      Height:        Intake/Output Summary (Last 24 hours) at 11/14/2020 1436 Last data filed at 11/14/2020 1300 Gross per 24 hour  Intake 240 ml  Output --  Net 240 ml   Filed Weights   11/06/20 0429 11/06/20 1158 11/11/20 1236  Weight: 70.3 kg 70.3 kg 70.3 kg    Examination:  General  exam: alert and not in distress.  Respiratory system: clear to ausculation, no wheezing.  Cardiovascular system: S1S2 RRR no JVD, no pedal edema.  Gastrointestinal system:  abdomen is soft, distended, incision sites clean, bowel sounds normal, tender in the RUQ,.   Central nervous system: alert and oriented,  Extremities: NO PEDAL EDEMA.  Skin: Incision sites on the abdomen looks clean Psychiatry: mood is appropriate.     Data Reviewed: I have personally reviewed following labs and imaging studies  CBC: Recent Labs  Lab 11/08/20 0433 11/09/20 0444 11/10/20 0415 11/11/20 0443 11/12/20 0455  WBC 4.3 5.7 6.3 6.9 8.3   HGB 12.0* 12.6* 13.1 14.0 13.0  HCT 36.9* 38.0* 41.1 43.3 40.9  MCV 87.2 85.6 86.3 87.1 87.6  PLT 179 199 236 251 254    Basic Metabolic Panel: Recent Labs  Lab 11/08/20 0433 11/09/20 0444 11/10/20 0415 11/11/20 0443 11/13/20 0359  NA 138 138 140 138  --   K 3.6 4.0 4.0 4.0  --   CL 103 104 104 100  --   CO2 25 27 24 27   --   GLUCOSE 130* 159* 120* 113*  --   BUN 13 18 19 19   --   CREATININE 0.89 0.93 1.02 0.95 1.10  CALCIUM 8.9 9.1 9.4 9.4  --     GFR: Estimated Creatinine Clearance: 63.7 mL/min (by C-G formula based on SCr of 1.1 mg/dL).  Liver Function Tests: Recent Labs  Lab 11/08/20 0433 11/09/20 0444 11/10/20 0415 11/11/20 0443 11/12/20 0455  AST 1,034* 220* 72* 81* 78*  ALT 2,078* 1,317* 855* 619* 453*  ALKPHOS 398* 405* 364* 349* 298*  BILITOT 1.9* 1.2 1.0 1.1 0.6  PROT 6.6 6.6 6.8 7.4 7.4  ALBUMIN 3.6 3.7 3.7 4.2 4.1    CBG: Recent Labs  Lab 11/13/20 1150 11/13/20 1734 11/13/20 2006 11/14/20 0722 11/14/20 1202  GLUCAP 149* 220* 198* 135* 241*     Recent Results (from the past 240 hour(s))  Resp Panel by RT-PCR (Flu A&B, Covid) Nasopharyngeal Swab     Status: None   Collection Time: 11/06/20  6:25 AM   Specimen: Nasopharyngeal Swab; Nasopharyngeal(NP) swabs in vial transport medium  Result Value Ref Range Status   SARS Coronavirus 2 by RT PCR NEGATIVE NEGATIVE Final    Comment: (NOTE) SARS-CoV-2 target nucleic acids are NOT DETECTED.  The SARS-CoV-2 RNA is generally detectable in upper respiratory specimens during the acute phase of infection. The lowest concentration of SARS-CoV-2 viral copies this assay can detect is 138 copies/mL. A negative result does not preclude SARS-Cov-2 infection and should not be used as the sole basis for treatment or other patient management decisions. A negative result may occur with  improper specimen collection/handling, submission of specimen other than nasopharyngeal swab, presence of viral mutation(s)  within the areas targeted by this assay, and inadequate number of viral copies(<138 copies/mL). A negative result must be combined with clinical observations, patient history, and epidemiological information. The expected result is Negative.  Fact Sheet for Patients:  11/16/20  Fact Sheet for Healthcare Providers:  01/06/21  This test is no t yet approved or cleared by the [employer] FDA and  has been authorized for detection and/or diagnosis of SARS-CoV-2 by FDA under an Emergency Use Authorization (EUA). This EUA will remain  in effect (meaning this test can be used) for the duration of the COVID-19 declaration under Section 564(b)(1) of the Act, 21 U.S.C.section 360bbb-3(b)(1), unless the authorization is terminated  or revoked sooner.  Influenza A by PCR NEGATIVE NEGATIVE Final   Influenza B by PCR NEGATIVE NEGATIVE Final    Comment: (NOTE) The Xpert Xpress SARS-CoV-2/FLU/RSV plus assay is intended as an aid in the diagnosis of influenza from Nasopharyngeal swab specimens and should not be used as a sole basis for treatment. Nasal washings and aspirates are unacceptable for Xpert Xpress SARS-CoV-2/FLU/RSV testing.  Fact Sheet for Patients: [employer]  Fact Sheet for Healthcare Providers: [employer]it  This test is not yet approved or cleared by the [place] FDA and has been authorized for detection and/or diagnosis of SARS-CoV-2 by FDA under an Emergency Use Authorization (EUA). This EUA will remain in effect (meaning this test can be used) for the duration of the COVID-19 declaration under Section 564(b)(1) of the Act, 21 U.S.C. section 360bbb-3(b)(1), unless the authorization is terminated or revoked.  Performed at [facility], [address], [place], [place] [address]   Surgical pcr screen     Status: None    Collection Time: 11/09/20  6:03 AM   Specimen: Nasal Mucosa; Nasal Swab  Result Value Ref Range Status   MRSA, PCR NEGATIVE NEGATIVE Final   Staphylococcus aureus NEGATIVE NEGATIVE Final    Comment: (NOTE) The Xpert SA Assay (FDA approved for NASAL specimens in patients 48 years of age and older), is one component of a comprehensive surveillance program. It is not intended to diagnose infection nor to guide or monitor treatment. Performed at [facility], [address], [place], [place] [address]          Radiology Studies: DG Abd 1 View  Result Date: 11/13/2020 CLINICAL DATA:  Abdominal pain.  Cholecystectomy 2 days ago EXAM: ABDOMEN - 1 VIEW COMPARISON:  CT 11/06/2020 FINDINGS: Nonobstructive bowel gas pattern. Cholecystectomy clips. Moderate volume of stool throughout the colon. No gross free intraperitoneal air on portable supine view. IMPRESSION: 1. Nonobstructive bowel gas pattern. 2. Moderate colonic stool burden. Electronically Signed   By: Duanne Guess D.O.   On: 11/13/2020 17:16        Scheduled Meds: . acetaminophen  1,000 mg Oral QID  . buPROPion  450 mg Oral q morning  . enoxaparin (LOVENOX) injection  40 mg Subcutaneous Q24H  . famotidine  40 mg Oral Daily  . gabapentin  300 mg Oral TID  . hyoscyamine  0.375 mg Oral BID  . insulin aspart  0-20 Units Subcutaneous TID WC  . insulin aspart  0-5 Units Subcutaneous QHS  . lidocaine  1 patch Transdermal Daily  . lip balm  1 application Topical BID  . methocarbamol  1,000 mg Oral TID  . mirtazapine  45 mg Oral QHS  . montelukast  10 mg Oral Daily  . pantoprazole  40 mg Oral Daily  . polycarbophil  625 mg Oral BID  . polyethylene glycol  17 g Oral BID  . senna-docusate  2 tablet Oral BID  . sodium chloride flush  3 mL Intravenous Q12H   Continuous Infusions: . sodium chloride    . lactated ringers       LOS: 8 days        Kathlen Mody, MD [facility]   To contact  the attending provider between 7A-7P or the covering provider during after hours 7P-7A, please log into the web site [email or web address] and access using universal [place] password for that web site. If you do not have the password, please call the hospital operator.  11/14/2020, 2:36 PM

## 2020-11-14 NOTE — Progress Notes (Signed)
3 Days Post-Op   Subjective/Chief Complaint: Patient's pain is better.  He has constipation.  KUB done yesterday shows constipation.  No nausea or vomiting.   Objective: Vital signs in last 24 hours: Temp:  [97.6 F (36.4 C)-98.3 F (36.8 C)] 97.6 F (36.4 C) (03/13 0523) Pulse Rate:  [74-89] 74 (03/13 0523) Resp:  [15-18] 15 (03/13 0523) BP: (115-124)/(62-81) 124/81 (03/13 0523) SpO2:  [98 %-100 %] 100 % (03/13 0523) Last BM Date: 11/10/20  Intake/Output from previous day: 03/12 0701 - 03/13 0700 In: 480 [P.O.:480] Out: -  Intake/Output this shift: No intake/output data recorded.  Incision/Wound: Port sites clean dry intact.  He does have some distention and fullness.  No rebound or guarding.  Lab Results:  Recent Labs    11/12/20 0455  WBC 8.3  HGB 13.0  HCT 40.9  PLT 254   BMET Recent Labs    11/13/20 0359  CREATININE 1.10   PT/INR No results for input(s): LABPROT, INR in the last 72 hours. ABG No results for input(s): PHART, HCO3 in the last 72 hours.  Invalid input(s): PCO2, PO2  Studies/Results: DG Abd 1 View  Result Date: 11/13/2020 CLINICAL DATA:  Abdominal pain.  Cholecystectomy 2 days ago EXAM: ABDOMEN - 1 VIEW COMPARISON:  CT 11/06/2020 FINDINGS: Nonobstructive bowel gas pattern. Cholecystectomy clips. Moderate volume of stool throughout the colon. No gross free intraperitoneal air on portable supine view. IMPRESSION: 1. Nonobstructive bowel gas pattern. 2. Moderate colonic stool burden. Electronically Signed   By: Duanne Guess D.O.   On: 11/13/2020 17:16    Anti-infectives: Anti-infectives (From admission, onward)   Start     Dose/Rate Route Frequency Ordered Stop   11/11/20 1332  sodium chloride 0.9 % with cefTRIAXone (ROCEPHIN) ADS Med       Note to Pharmacy: Vevelyn Royals  : cabinet override      11/11/20 1332 11/12/20 0144   11/07/20 0600  cefTRIAXone (ROCEPHIN) 2 g in sodium chloride 0.9 % 100 mL IVPB  Status:  Discontinued         2 g 200 mL/hr over 30 Minutes Intravenous Every 24 hours 11/06/20 1001 11/11/20 1600   11/06/20 0615  cefTRIAXone (ROCEPHIN) 2 g in sodium chloride 0.9 % 100 mL IVPB        2 g 200 mL/hr over 30 Minutes Intravenous  Once 11/06/20 2947 11/06/20 0703      Assessment/Plan: s/p Procedure(s): LAPAROSCOPIC CHOLECYSTECTOMY WITH  INTRAOPERATIVE CHOLANGIOGRAM (N/A) Agree with laxative use.  He has a chronic issue with constipation he tells me prior to discharge.  MiraLAX would be helpful on a daily basis for him.  He can go home once he has a bowel movement.  LOS: 8 days    Clovis Pu Cornett 11/14/2020

## 2020-11-14 NOTE — Plan of Care (Signed)

## 2020-11-15 DIAGNOSIS — R1011 Right upper quadrant pain: Secondary | ICD-10-CM

## 2020-11-15 LAB — GLUCOSE, CAPILLARY: Glucose-Capillary: 133 mg/dL — ABNORMAL HIGH (ref 70–99)

## 2020-11-15 LAB — CBC
HCT: 29 % — ABNORMAL LOW (ref 39.0–52.0)
Hemoglobin: 9.2 g/dL — ABNORMAL LOW (ref 13.0–17.0)
MCH: 28 pg (ref 26.0–34.0)
MCHC: 31.7 g/dL (ref 30.0–36.0)
MCV: 88.1 fL (ref 80.0–100.0)
Platelets: 237 10*3/uL (ref 150–400)
RBC: 3.29 MIL/uL — ABNORMAL LOW (ref 4.22–5.81)
RDW: 12.9 % (ref 11.5–15.5)
WBC: 5.6 10*3/uL (ref 4.0–10.5)
nRBC: 0 % (ref 0.0–0.2)

## 2020-11-15 LAB — COMPREHENSIVE METABOLIC PANEL
ALT: 141 U/L — ABNORMAL HIGH (ref 0–44)
AST: 23 U/L (ref 15–41)
Albumin: 3.3 g/dL — ABNORMAL LOW (ref 3.5–5.0)
Alkaline Phosphatase: 151 U/L — ABNORMAL HIGH (ref 38–126)
Anion gap: 13 (ref 5–15)
BUN: 12 mg/dL (ref 6–20)
CO2: 24 mmol/L (ref 22–32)
Calcium: 9 mg/dL (ref 8.9–10.3)
Chloride: 103 mmol/L (ref 98–111)
Creatinine, Ser: 0.75 mg/dL (ref 0.61–1.24)
GFR, Estimated: >60 mL/min (ref >60)
Glucose, Bld: 144 mg/dL — ABNORMAL HIGH (ref 70–99)
Potassium: 3.9 mmol/L (ref 3.5–5.1)
Sodium: 140 mmol/L (ref 135–145)
Total Bilirubin: 0.7 mg/dL (ref 0.3–1.2)
Total Protein: 6.2 g/dL — ABNORMAL LOW (ref 6.5–8.1)

## 2020-11-15 MED ORDER — CALCIUM POLYCARBOPHIL 625 MG PO TABS: 625.0000 mg | ORAL_TABLET | Freq: Two times a day (BID) | ORAL | 0 refills | Status: DC

## 2020-11-15 MED ORDER — IBUPROFEN 200 MG PO TABS: 600.0000 mg | ORAL_TABLET | Freq: Four times a day (QID) | ORAL | Status: DC | PRN

## 2020-11-15 MED ORDER — ACETAMINOPHEN 500 MG PO TABS: 1000.0000 mg | ORAL_TABLET | Freq: Three times a day (TID) | ORAL | Status: DC | PRN

## 2020-11-15 MED ORDER — BISACODYL 10 MG RE SUPP: 10.0000 mg | Freq: Two times a day (BID) | RECTAL | 0 refills | Status: DC | PRN

## 2020-11-15 MED ORDER — METFORMIN HCL ER 500 MG PO TB24: 500.0000 mg | ORAL_TABLET | Freq: Every day | ORAL | Status: DC

## 2020-11-15 MED ORDER — HYDROCORTISONE (PERIANAL) 2.5 % EX CREA: 1.0000 "application " | TOPICAL_CREAM | Freq: Four times a day (QID) | CUTANEOUS | 0 refills | Status: DC | PRN

## 2020-11-15 MED ORDER — POLYETHYLENE GLYCOL 3350 17 G PO PACK: 17.0000 g | PACK | Freq: Two times a day (BID) | ORAL | 0 refills | Status: DC

## 2020-11-15 MED ORDER — SENNOSIDES-DOCUSATE SODIUM 8.6-50 MG PO TABS: 2.0000 | ORAL_TABLET | Freq: Two times a day (BID) | ORAL | 1 refills | Status: DC

## 2020-11-15 MED ORDER — OXYCODONE HCL 5 MG PO TABS: 5.0000 mg | ORAL_TABLET | Freq: Four times a day (QID) | ORAL | 0 refills | Status: DC | PRN

## 2020-11-15 NOTE — Progress Notes (Signed)
Patient discharged home with wife, discharge instructions given and explained to patient/wife, they verbalized understanding, denies any pain/distress, No wound noted, surgical incision clean/dry/intact. Accompanied home by wife, transported to the car by staff.

## 2020-11-15 NOTE — Plan of Care (Signed)

## 2020-11-15 NOTE — Progress Notes (Signed)
PT Cancellation Note  Patient Details Name: Alexander Calderon MRN: [id number] DOB: 03-20-1961   Cancelled Treatment:    Reason Eval/Treat Not Completed: PT screened, no needs identified, will sign off Pt dressed in bed and reports no therapy needs at this time.  PT to sign off.   Theodis Kinsel,KATHrine E 11/15/2020, 9:36 AM Thomasene Mohair PT, DPT Acute Rehabilitation Services Pager: [phone] Office: [phone]

## 2020-11-15 NOTE — Progress Notes (Signed)
Progress Note  4 Days Post-Op  Subjective: Tolerating diet without nausea. Had a BM yesterday but is still feeling a little constipated, we discussed home regimen for constipation. He reports some mild RUQ pain that is improving. Discussed return to work precautions.   Objective: Vital signs in last 24 hours: Temp:  [97.8 F (36.6 C)-99 F (37.2 C)] 97.8 F (36.6 C) (03/14 0621) Pulse Rate:  [69-100] 69 (03/14 0621) Resp:  [15-18] 15 (03/14 0621) BP: (113-131)/(67-72) 131/67 (03/14 0621) SpO2:  [87 %-100 %] 99 % (03/14 0621) Last BM Date: 11/14/20  Intake/Output from previous day: 03/13 0701 - 03/14 0700 In: 600 [P.O.:600] Out: -  Intake/Output this shift: No intake/output data recorded.  PE: General: pleasant, WD, thin male who is laying in bed in NAD HEENT:   Sclera anicteric Heart: regular, rate, and rhythm.  Lungs: CTAB, no wheezes, rhonchi, or rales noted.  Respiratory effort nonlabored Abd: soft, appropriately ttp, mildly distended, +BS, incisions c/d/i MS: all 4 extremities are symmetrical with no cyanosis, clubbing, or edema. Skin: warm and dry with no masses, lesions, or rashes Neuro: Cranial nerves 2-12 grossly intact, sensation is normal throughout Psych: A&Ox3 with an appropriate affect.    Lab Results:  Recent Labs    11/15/20 0409  WBC 5.6  HGB 9.2*  HCT 29.0*  PLT 237   BMET Recent Labs    11/13/20 0359 11/15/20 0409  NA  --  140  K  --  3.9  CL  --  103  CO2  --  24  GLUCOSE  --  144*  BUN  --  12  CREATININE 1.10 0.75  CALCIUM  --  9.0   PT/INR No results for input(s): LABPROT, INR in the last 72 hours. CMP     Component Value Date/Time   NA 140 11/15/2020 0409   K 3.9 11/15/2020 0409   CL 103 11/15/2020 0409   CO2 24 11/15/2020 0409   GLUCOSE 144 (H) 11/15/2020 0409   BUN 12 11/15/2020 0409   CREATININE 0.75 11/15/2020 0409   CALCIUM 9.0 11/15/2020 0409   PROT 6.2 (L) 11/15/2020 0409   ALBUMIN 3.3 (L) 11/15/2020 0409    AST 23 11/15/2020 0409   ALT 141 (H) 11/15/2020 0409   ALKPHOS 151 (H) 11/15/2020 0409   BILITOT 0.7 11/15/2020 0409   GFRNONAA >60 11/15/2020 0409   GFRAA >60 03/02/2020 0230   Lipase  No results found for: LIPASE     Studies/Results: DG Abd 1 View  Result Date: 11/13/2020 CLINICAL DATA:  Abdominal pain.  Cholecystectomy 2 days ago EXAM: ABDOMEN - 1 VIEW COMPARISON:  CT 11/06/2020 FINDINGS: Nonobstructive bowel gas pattern. Cholecystectomy clips. Moderate volume of stool throughout the colon. No gross free intraperitoneal air on portable supine view. IMPRESSION: 1. Nonobstructive bowel gas pattern. 2. Moderate colonic stool burden. Electronically Signed   By: Duanne Guess D.O.   On: 11/13/2020 17:16    Anti-infectives: Anti-infectives (From admission, onward)   Start     Dose/Rate Route Frequency Ordered Stop   11/11/20 1332  sodium chloride 0.9 % with cefTRIAXone (ROCEPHIN) ADS Med       Note to Pharmacy: Vevelyn Royals  : cabinet override      11/11/20 1332 11/12/20 0144   11/07/20 0600  cefTRIAXone (ROCEPHIN) 2 g in sodium chloride 0.9 % 100 mL IVPB  Status:  Discontinued        2 g 200 mL/hr over 30 Minutes Intravenous Every 24 hours 11/06/20  1001 11/11/20 1600   11/06/20 0615  cefTRIAXone (ROCEPHIN) 2 g in sodium chloride 0.9 % 100 mL IVPB        2 g 200 mL/hr over 30 Minutes Intravenous  Once 11/06/20 8421 11/06/20 0703       Assessment/Plan CAD - on Plavix at home, ok to resume from a surgical standpoint Hypertension Diabetes - above per TRH -   Chronic cholecystitis  S/p laparoscopic cholecystectomy 11/11/20 Dr. Daphine Deutscher - POD#4 - tolerating diet, having bowel function with regimen - incisions c/d/i - ok to discharge from a surgical perspective, follow up and instructions in AVS  FEN: reg diet, bowel regimen VTE: ok to resume plavix, lovenox ID: no current abx    LOS: 9 days    Juliet Rude , [facility] 11/15/2020, 8:46  AM Please see Amion for pager number during day hours 7:00am-4:30pm

## 2020-11-16 NOTE — Discharge Summary (Signed)
Physician Discharge Summary  Gavon Majano ZOX:[id number] DOB: 07-25-1961 DOA: 11/06/2020  PCP: Loyal Jacobson, MD  Admit date: 11/06/2020 Discharge date: 11/15/2020  Admitted From:Home.  Disposition:  Home.   Recommendations for Outpatient Follow-up:  1. Follow up with PCP in 1-2 weeks 2. Please obtain BMP/CBC in one week Please follow up with general surgery as recommended.   Discharge Condition:stable.  CODE STATUS:full code.  Diet recommendation: Heart Healthy / Carb Modified  Brief/Interim Summary:  60 year old gentleman prior history of coronary artery disease on aspirin, Plavix, type 2 diabetes, hypertension, hyperlipidemia admitted for abdominal pain he was found to have acute cholecystitis. General surgery consulted and he scheduled for lap cholecystectomy on 11/10/2020. Pt seen and examined at bedside, abd pain has resolved, no nausea or vomiting.   Discharge Diagnoses:  Active Problems:   Cholecystitis   Pain   Acute cholecystitis Underwent  lap cholecystectomy with intra operative cholangiogram on 11/11/2020, . His liver enzymes are improving. No nausea, vomiting, abd pain has improved,     History of coronary artery disease s/p multiple stents Patient at home on aspirin and Plavix, statin.    Essential hypertension Blood pressure parameters appear to be normal.     Type 2 diabetes mellitus Resume home meds on discharge.  A1c at 6.9.   Hyperlipidemia Holding statins for elevated liver enzymes.    GERD Stable on PPI.  Some chest tightness Nonradiating, worsening with deep inspiration Patient reports he was not able to pass gas/flatulence EKG shows normal sinus rhythm, troponin is negative Improvement with simethicone Chest x-ray is negative for acute cardiopulmonary disease Currently patient denies any chest pain or shortness of breath    Discharge Instructions  Discharge Instructions    Diet - low sodium heart healthy    Complete by: As directed    Discharge instructions   Complete by: As directed    Please follow up with general surgery as needed.   No wound care   Complete by: As directed      Allergies as of 11/15/2020      Reactions   Dextrans Nausea Only      Medication List    STOP taking these medications   chlorpheniramine-HYDROcodone 10-8 MG/5ML Suer Commonly known as: TUSSIONEX   HYDROcodone-acetaminophen 5-325 MG tablet Commonly known as: NORCO/VICODIN   omeprazole 40 MG capsule Commonly known as: PRILOSEC     TAKE these medications   acetaminophen 500 MG tablet Commonly known as: TYLENOL Take 2 tablets (1,000 mg total) by mouth every 8 (eight) hours as needed for mild pain or fever.   albuterol 108 (90 Base) MCG/ACT inhaler Commonly known as: VENTOLIN HFA Inhale 2 puffs into the lungs every 6 (six) hours as needed for wheezing.   aspirin 81 MG tablet Take 81 mg by mouth daily.   Azelastine HCl 0.15 % Soln Place 1 spray into the nose as needed (allergies).   bisacodyl 10 MG suppository Commonly known as: DULCOLAX Place 1 suppository (10 mg total) rectally every 12 (twelve) hours as needed for mild constipation or moderate constipation.   buPROPion 150 MG 24 hr tablet Commonly known as: WELLBUTRIN XL Take 450 mg by mouth every morning.   citalopram 40 MG tablet Commonly known as: CELEXA Take 40 mg by mouth daily as needed (mood).   clopidogrel 75 MG tablet Commonly known as: PLAVIX TAKE ONE (1) TABLET BY MOUTH EVERY DAY What changed: how much to take   ezetimibe 10 MG tablet Commonly known as: ZETIA Take 10  mg by mouth at bedtime.   famotidine 20 MG tablet Commonly known as: PEPCID Take 2 tablets by mouth daily.   ferrous sulfate 325 (65 FE) MG tablet Take 325 mg by mouth 2 (two) times daily.   fluticasone 50 MCG/ACT nasal spray Commonly known as: FLONASE Place 1 spray into both nostrils as needed for allergies.   hydrocortisone 2.5 % rectal  cream Commonly known as: ANUSOL-HC Apply 1 application topically 4 (four) times daily as needed for hemorrhoids.   hyoscyamine 0.375 MG 12 hr tablet Commonly known as: LEVBID Take 0.375 mg by mouth 2 (two) times daily.   ibuprofen 200 MG tablet Commonly known as: ADVIL Take 3 tablets (600 mg total) by mouth every 6 (six) hours as needed for fever, headache or mild pain.   LORazepam 1 MG tablet Commonly known as: ATIVAN Take 1 mg by mouth every 8 (eight) hours as needed for anxiety.   metFORMIN 500 MG 24 hr tablet Commonly known as: GLUCOPHAGE-XR Take 1 tablet (500 mg total) by mouth daily. What changed: how much to take   metoprolol succinate 50 MG 24 hr tablet Commonly known as: TOPROL-XL Take 1 tablet by mouth daily.   mirtazapine 45 MG tablet Commonly known as: REMERON Take 45 mg by mouth at bedtime.   nitroGLYCERIN 0.4 MG SL tablet Commonly known as: NITROSTAT DISSOLVE 1 TABLET UNDER TONGUE EVERY 5 MINUTES FOR UP TO 3 DOSES AS NEEDED FOR CHEST PAIN What changed: See the new instructions.   oxyCODONE 5 MG immediate release tablet Commonly known as: Oxy IR/ROXICODONE Take 1 tablet (5 mg total) by mouth every 6 (six) hours as needed for moderate pain or severe pain.   polycarbophil 625 MG tablet Commonly known as: FIBERCON Take 1 tablet (625 mg total) by mouth 2 (two) times daily.   polyethylene glycol 17 g packet Commonly known as: MIRALAX / GLYCOLAX Take 17 g by mouth 2 (two) times daily.   QC Heartburn Antacid 160-105 MG Chew Generic drug: Alum Hydroxide-Mag Carbonate Chew 1 tablet by mouth 3 (three) times daily as needed for heartburn.   rosuvastatin 40 MG tablet Commonly known as: CRESTOR Take 40 mg by mouth at bedtime.   senna-docusate 8.6-50 MG tablet Commonly known as: Senokot-S Take 2 tablets by mouth 2 (two) times daily.   sildenafil 20 MG tablet Commonly known as: REVATIO Take 40-100 mg by mouth as needed (erectile dysfunction).   Singulair 10  MG tablet Generic drug: montelukast Take 10 mg by mouth daily.   sitaGLIPtin 100 MG tablet Commonly known as: JANUVIA Take 100 mg by mouth daily.       Follow-up Information    [facility], [place]. Go on 11/25/2020.   Specialty: General Surgery Why: at 1:45 pm for follow up from recent gallbladder surgery. please arrive 30 minutes early to check in and fill out any necessary paperwork. Contact information: [address] [place] [address] [phone]       Loyal Jacobson, MD. Schedule an appointment as soon as possible for a visit in 1 week(s).   Specialty: Family Medicine Contact information: [address] [place] [address] [phone]        Kathleene Hazel, MD .   Specialty: Cardiology Contact information: [address] [place] [address] [phone]              Allergies  Allergen Reactions  . Dextrans Nausea Only    Consultations:  Surgery     Procedures/Studies: DG Abd 1 View  Result Date: 11/13/2020 CLINICAL DATA:  Abdominal pain.  Cholecystectomy 2 days ago EXAM: ABDOMEN - 1 VIEW COMPARISON:  CT 11/06/2020 FINDINGS: Nonobstructive bowel gas pattern. Cholecystectomy clips. Moderate volume of stool throughout the colon. No gross free intraperitoneal air on portable supine view. IMPRESSION: 1. Nonobstructive bowel gas pattern. 2. Moderate colonic stool burden. Electronically Signed   By: Duanne Guess D.O.   On: 11/13/2020 17:16   DG Cholangiogram Operative  Result Date: 11/11/2020 CLINICAL DATA:  60 year old male with history of acute cholecystitis. EXAM: INTRAOPERATIVE CHOLANGIOGRAM TECHNIQUE: Cholangiographic images from the C-arm fluoroscopic device were submitted for interpretation post-operatively. Please see the procedural report for the amount of contrast and the fluoroscopy time utilized. COMPARISON:  11/06/2020 FINDINGS: Intraoperative antegrade  injection via the cystic duct which opacifies the common bile duct and central portions of the intrahepatic biliary tree. Contrast is visualized flowing freely into the duodenum. There are no filling defects. No significant intra or extrahepatic biliary ductal dilation. No apparent anomalous anatomical configuration of the biliary tree. IMPRESSION: No evidence of choledocholithiasis. Marliss Coots, MD [facility] Electronically Signed   By: Marliss Coots MD   On: 11/11/2020 14:27   DG CHEST PORT 1 VIEW  Result Date: 11/12/2020 CLINICAL DATA:  Chest discomfort EXAM: PORTABLE CHEST 1 VIEW COMPARISON:  11/06/2020 FINDINGS: The heart size and mediastinal contours are within normal limits. Right coronary stent. Both lungs are clear. The visualized skeletal structures are unremarkable. IMPRESSION: No active disease. Electronically Signed   By: Marlan Palau M.D.   On: 11/12/2020 11:51   DG Chest Portable 1 View  Result Date: 11/06/2020 CLINICAL DATA:  Epigastric pain EXAM: PORTABLE CHEST 1 VIEW COMPARISON:  01/17/2020 FINDINGS: The heart size and mediastinal contours are within normal limits. Both lungs are clear. The visualized skeletal structures are unremarkable. IMPRESSION: No active disease. Electronically Signed   By: Helyn Numbers MD   On: 11/06/2020 04:51   CT Angio Chest/Abd/Pel for Dissection W and/or Wo Contrast  Result Date: 11/06/2020 CLINICAL DATA:  Chest and abdominal pain EXAM: CT ANGIOGRAPHY CHEST, ABDOMEN AND PELVIS TECHNIQUE: Non-contrast CT of the chest was initially obtained. Multidetector CT imaging through the chest, abdomen and pelvis was performed using the standard protocol during bolus administration of intravenous contrast. Multiplanar reconstructed images and MIPs were obtained and reviewed to evaluate the vascular anatomy. CONTRAST:  OMNIPAQUE IOHEXOL 350 MG/ML SOLN COMPARISON:  06/25/2020 FINDINGS: CTA CHEST FINDINGS  Cardiovascular: The thoracic aorta is normal in caliber. No evidence of intramural hematoma, dissection, or aneurysm. The arch vasculature demonstrates normal anatomic configuration and is widely patent proximally. No significant atherosclerotic plaque. Right coronary artery stenting has been performed. Global cardiac size within normal limits. No pericardial effusion. Central pulmonary arteries are of normal caliber. Mediastinum/Nodes: The thyroid is unremarkable. No pathologic thoracic adenopathy. Small hiatal hernia. The esophagus is diffusely mildly fluid-filled suggesting changes of gastroesophageal reflux. Lungs/Pleura: Lungs are clear. No pleural effusion or pneumothorax. Central airways are widely patent. Musculoskeletal: No acute bone abnormality Review of the MIP images confirms the above findings. CTA ABDOMEN AND PELVIS FINDINGS VASCULAR Aorta: Normal caliber. No aneurysm or dissection. Moderate mixed atherosclerotic plaque. No periaortic inflammatory changes. Celiac: Widely patent.  Normal anatomic configuration. SMA: Widely patent.  No aneurysm or dissection. Renals: Single renal arteries are widely patent bilaterally and demonstrate normal vascular morphology. No aneurysm. IMA: Widely patent. Inflow: Mild mixed atherosclerotic plaque within  the proximal lower extremity arterial inflow bilaterally without evidence of hemodynamically significant stenosis. No aneurysm or dissection. Internal iliac arteries are patent bilaterally. Veins: Morphologically normal. Review of the MIP images confirms the above findings. NON-VASCULAR Hepatobiliary: The gallbladder is distended and there is pericholecystic inflammatory change identified in keeping with changes of acute cholecystitis. Liver unremarkable. No intra or extrahepatic biliary ductal dilation. Pancreas: Unremarkable Spleen: Unremarkable Adrenals/Urinary Tract: Adrenal glands are unremarkable. Kidneys are normal, without renal calculi, focal lesion, or  hydronephrosis. Bladder is unremarkable. Stomach/Bowel: The stomach, small bowel, and large bowel are unremarkable. Appendectomy has been performed. No free intraperitoneal gas or fluid. Lymphatic: No pathologic adenopathy within the abdomen and pelvis. Reproductive: Prostate is unremarkable. Other: No abdominal wall hernia. Musculoskeletal: No acute bone abnormality. Review of the MIP images confirms the above findings. IMPRESSION: No evidence of thoracoabdominal aortic aneurysm or dissection. Moderate atherosclerotic plaque within the abdominal aorta. Acute cholecystitis. Fluid-filled esophagus likely reflecting changes of gastroesophageal reflux. Small hiatal hernia. Aortic Atherosclerosis (ICD10-I70.0). Electronically Signed   By: Helyn Numbers MD   On: 11/06/2020 06:04   US Abdomen Limited RUQ (LIVER/GB)  Result Date: 11/06/2020 CLINICAL DATA:  Diffuse abdominal pain EXAM: ULTRASOUND ABDOMEN LIMITED RIGHT UPPER QUADRANT COMPARISON:  None. FINDINGS: Gallbladder: The gallbladder is distended, contains layering sludge, and demonstrates mild pericholecystic fluid best seen along the a hepatic wall. Together, the findings are in keeping with changes of acute cholecystitis. The sonographic Eulah Pont sign is equivocal due to pain medication administration immediately prior to this examination. Common bile duct: Diameter: 2 mm in proximal diameter. Liver: No focal lesion identified. Within normal limits in parenchymal echogenicity. Portal vein is patent on color Doppler imaging with normal direction of blood flow towards the liver. Other: None. IMPRESSION: Acute cholecystitis. Electronically Signed   By: Helyn Numbers MD   On: 11/06/2020 05:54    Subjective: abd pain has resolved.   Discharge Exam: Vitals:   11/14/20 2113 11/15/20 0621  BP: 113/71 131/67  Pulse: 83 69  Resp: 15 15  Temp: 98.9 F (37.2 C) 97.8 F (36.6 C)  SpO2: 100% 99%   Vitals:   11/14/20 0523 11/14/20 1257 11/14/20 2113 11/15/20  0621  BP: 124/81 126/72 113/71 131/67  Pulse: 74 100 83 69  Resp: 15 18 15 15   Temp: 97.6 F (36.4 C) 99 F (37.2 C) 98.9 F (37.2 C) 97.8 F (36.6 C)  TempSrc: Oral Oral Oral   SpO2: 100% (!) 87% 100% 99%  Weight:      Height:        General: Pt is alert, awake, not in acute distress Cardiovascular: RRR, S1/S2 +, no rubs, no gallops Respiratory: CTA bilaterally, no wheezing, no rhonchi Abdominal: Soft, NT, ND, bowel sounds + Extremities: no edema, no cyanosis    The results of significant diagnostics from this hospitalization (including imaging, microbiology, ancillary and laboratory) are listed below for reference.     Microbiology: Recent Results (from the past 240 hour(s))  Surgical pcr screen     Status: None   Collection Time: 11/09/20  6:03 AM   Specimen: Nasal Mucosa; Nasal Swab  Result Value Ref Range Status   MRSA, PCR NEGATIVE NEGATIVE Final   Staphylococcus aureus NEGATIVE NEGATIVE Final    Comment: (NOTE) The Xpert SA Assay (FDA approved for NASAL specimens in patients 64 years of age and older), is one component of a comprehensive surveillance program. It is not intended to diagnose infection nor to guide or monitor treatment. Performed at  [facility], [address] [place], [place] [address]      Labs: BNP (last 3 results) Recent Labs    01/17/20 0748  BNP 17.8   Basic Metabolic Panel: Recent Labs  Lab 11/10/20 0415 11/11/20 0443 11/13/20 0359 11/15/20 0409  NA 140 138  --  140  K 4.0 4.0  --  3.9  CL 104 100  --  103  CO2 24 27  --  24  GLUCOSE 120* 113*  --  144*  BUN 19 19  --  12  CREATININE 1.02 0.95 1.10 0.75  CALCIUM 9.4 9.4  --  9.0   Liver Function Tests: Recent Labs  Lab 11/10/20 0415 11/11/20 0443 11/12/20 0455 11/15/20 0409  AST 72* 81* 78* 23  ALT 855* 619* 453* 141*  ALKPHOS 364* 349* 298* 151*  BILITOT 1.0 1.1 0.6 0.7  PROT 6.8 7.4 7.4 6.2*  ALBUMIN 3.7 4.2 4.1 3.3*   No results for  input(s): LIPASE, AMYLASE in the last 168 hours. No results for input(s): AMMONIA in the last 168 hours. CBC: Recent Labs  Lab 11/10/20 0415 11/11/20 0443 11/12/20 0455 11/15/20 0409  WBC 6.3 6.9 8.3 5.6  HGB 13.1 14.0 13.0 9.2*  HCT 41.1 43.3 40.9 29.0*  MCV 86.3 87.1 87.6 88.1  PLT 236 251 254 237   Cardiac Enzymes: No results for input(s): CKTOTAL, CKMB, CKMBINDEX, TROPONINI in the last 168 hours. BNP: Invalid input(s): POCBNP CBG: Recent Labs  Lab 11/14/20 0722 11/14/20 1202 11/14/20 1709 11/14/20 2018 11/15/20 0759  GLUCAP 135* 241* 105* 210* 133*   D-Dimer No results for input(s): DDIMER in the last 72 hours. Hgb A1c No results for input(s): HGBA1C in the last 72 hours. Lipid Profile No results for input(s): CHOL, HDL, LDLCALC, TRIG, CHOLHDL, LDLDIRECT in the last 72 hours. Thyroid function studies No results for input(s): TSH, T4TOTAL, T3FREE, THYROIDAB in the last 72 hours.  Invalid input(s): FREET3 Anemia work up No results for input(s): VITAMINB12, FOLATE, FERRITIN, TIBC, IRON, RETICCTPCT in the last 72 hours. Urinalysis    Component Value Date/Time   COLORURINE YELLOW 06/13/2020 1855   APPEARANCEUR CLEAR 06/13/2020 1855   LABSPEC 1.010 06/13/2020 1855   PHURINE 6.0 06/13/2020 1855   GLUCOSEU NEGATIVE 06/13/2020 1855   HGBUR NEGATIVE 06/13/2020 1855   BILIRUBINUR NEGATIVE 06/13/2020 1855   KETONESUR NEGATIVE 06/13/2020 1855   PROTEINUR NEGATIVE 06/13/2020 1855   NITRITE NEGATIVE 06/13/2020 1855   LEUKOCYTESUR NEGATIVE 06/13/2020 1855   Sepsis Labs Invalid input(s): PROCALCITONIN,  WBC,  LACTICIDVEN Microbiology Recent Results (from the past 240 hour(s))  Surgical pcr screen     Status: None   Collection Time: 11/09/20  6:03 AM   Specimen: Nasal Mucosa; Nasal Swab  Result Value Ref Range Status   MRSA, PCR NEGATIVE NEGATIVE Final   Staphylococcus aureus NEGATIVE NEGATIVE Final    Comment: (NOTE) The Xpert SA Assay (FDA approved for NASAL  specimens in patients 58 years of age and older), is one component of a comprehensive surveillance program. It is not intended to diagnose infection nor to guide or monitor treatment. Performed at [facility], [address] [place], [place] [address]      Time coordinating discharge: 34 minutes.  SIGNED:   Kathlen ModyVijaya Akula, MD  [facility]

## 2021-02-16 ENCOUNTER — Other Ambulatory Visit: Payer: Self-pay | Admitting: Cardiovascular Disease

## 2021-06-08 IMAGING — DX DG CHEST 1V PORT
1 series · 1 of 1 positions shown · non-contrast
Comparison: 11/16/2013

CLINICAL DATA: Shortness of breath

EXAM:
PORTABLE CHEST 1 VIEW

[chest ap]
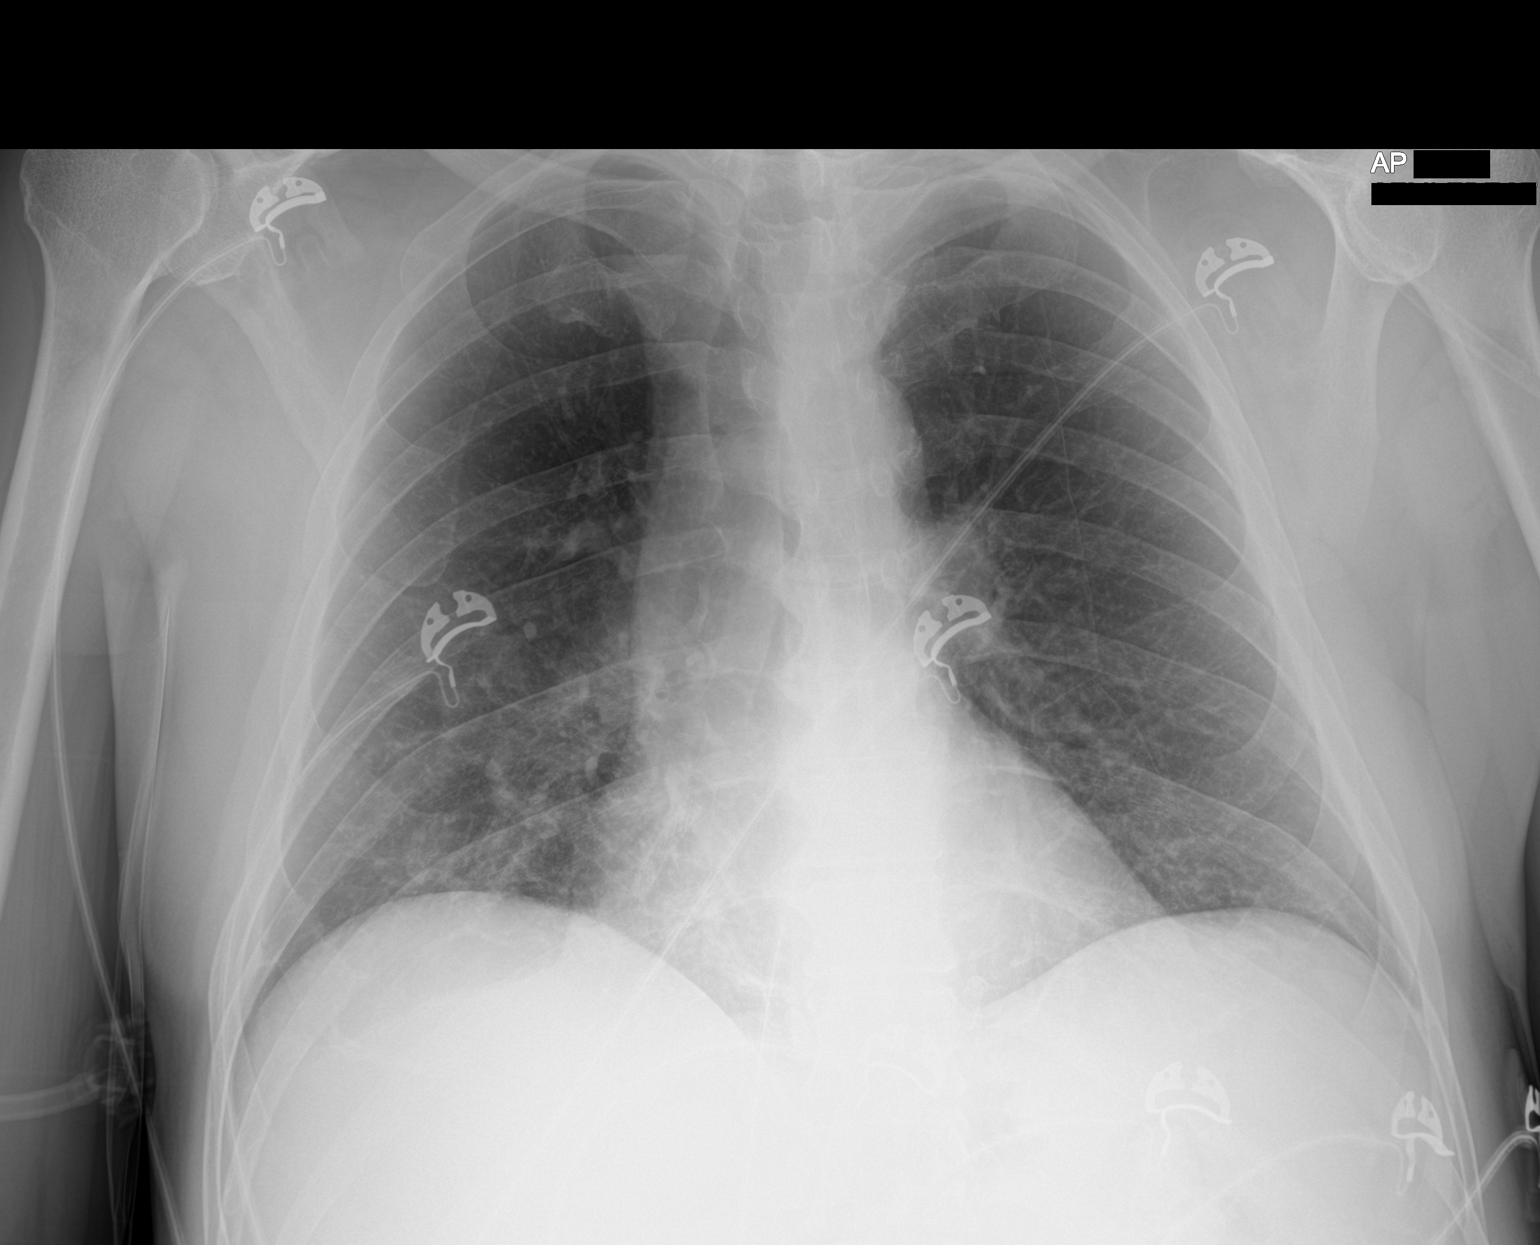

[1 of 1 positions shown; findings below may reference images not displayed]

FINDINGS: The heart size and mediastinal contours are within normal limits.
Both lungs are clear. The visualized skeletal structures are
unremarkable.
IMPRESSION: No active disease.

## 2021-12-05 ENCOUNTER — Other Ambulatory Visit: Payer: Self-pay | Admitting: Cardiovascular Disease

## 2021-12-21 ENCOUNTER — Emergency Department (HOSPITAL_BASED_OUTPATIENT_CLINIC_OR_DEPARTMENT_OTHER): Payer: Federal, State, Local not specified - PPO

## 2021-12-21 ENCOUNTER — Emergency Department (HOSPITAL_BASED_OUTPATIENT_CLINIC_OR_DEPARTMENT_OTHER)
Admission: EM | Admit: 2021-12-21 | Discharge: 2021-12-21 | Disposition: A | Discharge status: Home / Self Care | Payer: Federal, State, Local not specified - PPO | Attending: Emergency Medicine | Admitting: Emergency Medicine

## 2021-12-21 ENCOUNTER — Other Ambulatory Visit: Payer: Self-pay

## 2021-12-21 DIAGNOSIS — R0789 Other chest pain: Secondary | ICD-10-CM | POA: Insufficient documentation

## 2021-12-21 DIAGNOSIS — Z955 Presence of coronary angioplasty implant and graft: Secondary | ICD-10-CM | POA: Insufficient documentation

## 2021-12-21 DIAGNOSIS — I2510 Atherosclerotic heart disease of native coronary artery without angina pectoris: Secondary | ICD-10-CM | POA: Insufficient documentation

## 2021-12-21 DIAGNOSIS — Z7982 Long term (current) use of aspirin: Secondary | ICD-10-CM | POA: Diagnosis not present

## 2021-12-21 DIAGNOSIS — Z7902 Long term (current) use of antithrombotics/antiplatelets: Secondary | ICD-10-CM | POA: Diagnosis not present

## 2021-12-21 LAB — TROPONIN I (HIGH SENSITIVITY): Troponin I (High Sensitivity): 3 ng/L (ref <18)

## 2021-12-21 NOTE — ED Triage Notes (Signed)
States was in [facility] 4/7. States 4 days ago started getting chest pain. Pain worse with movement.  ? ?Wife states pt has had memory issues lately, she thinks he has had the pain longer. ?
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
## 2021-12-21 NOTE — Discharge Instructions (Addendum)
Please refer to the attached instructions, and follow-up with your primary care and/or cardiology provider as discussed.  ?

## 2021-12-23 ENCOUNTER — Other Ambulatory Visit: Payer: Self-pay | Admitting: Cardiovascular Disease
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
## 2022-01-03 ENCOUNTER — Ambulatory Visit: Payer: Federal, State, Local not specified - PPO | Admitting: Physician Assistant

## 2022-01-03 DIAGNOSIS — E785 Hyperlipidemia, unspecified: Secondary | ICD-10-CM

## 2022-01-03 DIAGNOSIS — S2691XD Contusion of heart, unspecified with or without hemopericardium, subsequent encounter: Secondary | ICD-10-CM

## 2022-01-03 DIAGNOSIS — I2510 Atherosclerotic heart disease of native coronary artery without angina pectoris: Secondary | ICD-10-CM

## 2022-01-03 DIAGNOSIS — I10 Essential (primary) hypertension: Secondary | ICD-10-CM

## 2022-01-06 ENCOUNTER — Ambulatory Visit: Payer: Federal, State, Local not specified - PPO | Admitting: Physician Assistant

## 2022-03-09 ENCOUNTER — Ambulatory Visit: Payer: Federal, State, Local not specified - PPO | Admitting: Physician Assistant

## 2022-03-20 NOTE — Progress Notes (Unsigned)
Office Visit    Patient Name: Alexander Calderon Date of Encounter: 03/21/2022  PCP:  Loyal Jacobson, MD   [facility]  Cardiologist:  Verne Carrow, MD  Advanced Practice Provider:  No care team member to display Electrophysiologist:  None   Chief Complaint    [name] is a 61 y.o. male with a hx of CAD, HTN and asthma presents today for follow-up visit.  He was last seen via video visit 09/20/2020.  His CAD dates back to 2000 when he had a bare-metal stent placed in the RCA.  Cardiac catheterization 2002 with another bare-metal stent placed in RCA.  He was admitted with unstable angina 11/16/2013 at [facility] and cardiac catheterization 11/17/2013 showed severe distal RCA stenosis which was treated with a DES.  Echo 2014 with LVEF 65 to 70% with no valve disease.  When he was last seen he did not have any chest pain, dyspnea, palpitations, dizziness, near-syncope or syncope.  No lower extremity edema.  Today, he states that he has a pain in his stomach and then moves up into his chest.  It was happening twice a month but has not happened in the past couple of months.  He states that he takes a nitro and his pain is relieved.  He states that this does not happen after eating.  He does not have any associated shortness of breath.  With his extensive history of CAD and 3 stents placed in the past we discussed stress testing.  His father actually had a heart attack at age 61 and passed away.  His first heart attack was at age 33.  His primary care gets a lipid panel every 6 months.  His last 1 was done in April which showed LDL 59, HDL 50, total cholesterol 353, triglycerides 103.  He has had no issues with his medications.  He has asked when he can stop his Plavix.  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  Reports no shortness of breath nor dyspnea on exertion.No edema, orthopnea, PND. Reports no palpitations.    Past  Medical History    Past Medical History:  Diagnosis Date   Asthma    Coronary artery disease    a. history of BMS to RCA in 2000 and again in 2002. b. PTCA of the distal LCx in 2011. c. [place] 11/2013: s/p DES to distal RCA, normal LV function (done from L radial approach as pt has history of  difficulty engaging the LCA and significant radial spasm from R radial approach).   Depression    GERD (gastroesophageal reflux disease)    Hyperlipidemia    Hypertension    Type 2 diabetes mellitus (HCC)    Past Surgical History:  Procedure Laterality Date   APPENDECTOMY     CHOLECYSTECTOMY N/A 11/11/2020   Procedure: LAPAROSCOPIC CHOLECYSTECTOMY WITH  INTRAOPERATIVE CHOLANGIOGRAM;  Surgeon: Luretha Murphy, MD;  Location: [facility]S;  Service: General;  Laterality: N/A;   CORONARY ANGIOPLASTY WITH STENT PLACEMENT     LEFT HEART CATHETERIZATION WITH CORONARY ANGIOGRAM N/A 11/17/2013   Procedure: LEFT HEART CATHETERIZATION WITH CORONARY ANGIOGRAM;  Surgeon: Peter M Swaziland, MD;  Location: [facility] CATH LAB;  Service: Cardiovascular;  Laterality: N/A;    Allergies  Allergies  Allergen Reactions   Dextrans Nausea Only     EKGs/Labs/Other Studies Reviewed:   The following studies were reviewed today:  Echocardiogram: 06/2013 Left ventricle: The cavity size  was normal. Wall thickness    was increased in a pattern of mild LVH. Systolic function    was vigorous. The estimated ejection fraction was in the    range of 65% to 70%. Wall motion was normal; there were no    regional wall motion abnormalities.  - Atrial septum: No defect or patent foramen ovale was    identified.   EKG:  EKG is not ordered today.   Recent Labs: No results found for requested labs within last 365 days.  Recent Lipid Panel    Component Value Date/Time   CHOL 141 11/17/2013 0255   TRIG 289 (H) 11/17/2013 0255   HDL 43 11/17/2013 0255   CHOLHDL 3.3 11/17/2013 0255   VLDL 58 (H) 11/17/2013 0255   LDLCALC 40 11/17/2013 0255     Home Medications   Current Meds  Medication Sig   albuterol (PROVENTIL HFA;VENTOLIN HFA) 108 (90 BASE) MCG/ACT inhaler Inhale 2 puffs into the lungs every 6 (six) hours as needed for wheezing.   aspirin 81 MG tablet Take 81 mg by mouth daily.   Azelastine HCl 0.15 % SOLN Place 1 spray into the nose as needed (allergies).    citalopram (CELEXA) 40 MG tablet Take 40 mg by mouth daily as needed (mood).   clopidogrel (PLAVIX) 75 MG tablet TAKE ONE (1) TABLET BY MOUTH EVERY DAY   ezetimibe (ZETIA) 10 MG tablet Take 10 mg by mouth at bedtime.   ferrous sulfate 325 (65 FE) MG tablet Take 325 mg by mouth 2 (two) times daily.   fluticasone (FLONASE) 50 MCG/ACT nasal spray Place 1 spray into both nostrils as needed for allergies.    hyoscyamine (LEVBID) 0.375 MG 12 hr tablet Take 0.375 mg by mouth 2 (two) times daily.   LORazepam (ATIVAN) 1 MG tablet Take 1 mg by mouth every 8 (eight) hours as needed for anxiety.   metFORMIN (GLUCOPHAGE-XR) 500 MG 24 hr tablet Take 1 tablet (500 mg total) by mouth daily.   mirtazapine (REMERON) 45 MG tablet Take 45 mg by mouth at bedtime.   nitroGLYCERIN (NITROSTAT) 0.4 MG SL tablet DISSOLVE ONE TABLET UNDER TONGUE EVERY 5 MINUTES AS NEEDED UP TO 3 DOSES. IF NORELIEF CALL 911.   omeprazole (PRILOSEC) 40 MG capsule Take 1 capsule by mouth daily.   polycarbophil (FIBERCON) 625 MG tablet Take 1 tablet (625 mg total) by mouth 2 (two) times daily.   rosuvastatin (CRESTOR) 40 MG tablet Take 40 mg by mouth at bedtime.   SINGULAIR 10 MG tablet Take 10 mg by mouth daily.    sitaGLIPtin (JANUVIA) 100 MG tablet Take 100 mg by mouth daily.   valsartan (DIOVAN) 160 MG tablet Take 1 tablet by mouth daily.   [DISCONTINUED] acetaminophen (TYLENOL) 500 MG tablet Take 2 tablets (1,000 mg total) by mouth every 8 (eight) hours as needed for mild pain or fever.   [DISCONTINUED] bisacodyl (DULCOLAX) 10 MG suppository Place 1 suppository (10 mg total) rectally every 12 (twelve) hours  as needed for mild constipation or moderate constipation.   [DISCONTINUED] buPROPion (WELLBUTRIN XL) 150 MG 24 hr tablet Take 450 mg by mouth every morning.   [DISCONTINUED] famotidine (PEPCID) 20 MG tablet Take 2 tablets by mouth daily.   [DISCONTINUED] hydrocortisone (ANUSOL-HC) 2.5 % rectal cream Apply 1 application topically 4 (four) times daily as needed for hemorrhoids.   [DISCONTINUED] ibuprofen (ADVIL) 200 MG tablet Take 3 tablets (600 mg total) by mouth every 6 (six) hours as needed for fever, headache  or mild pain.   [DISCONTINUED] metoprolol (TOPROL-XL) 50 MG 24 hr tablet Take 1 tablet by mouth daily.   [DISCONTINUED] oxyCODONE (OXY IR/ROXICODONE) 5 MG immediate release tablet Take 1 tablet (5 mg total) by mouth every 6 (six) hours as needed for moderate pain or severe pain.   [DISCONTINUED] polyethylene glycol (MIRALAX / GLYCOLAX) 17 g packet Take 17 g by mouth 2 (two) times daily.   [DISCONTINUED] QC HEARTBURN ANTACID 160-105 MG CHEW Chew 1 tablet by mouth 3 (three) times daily as needed for heartburn.   [DISCONTINUED] senna-docusate (SENOKOT-S) 8.6-50 MG tablet Take 2 tablets by mouth 2 (two) times daily.   [DISCONTINUED] sildenafil (REVATIO) 20 MG tablet Take 40-100 mg by mouth as needed (erectile dysfunction).      Review of Systems      All other systems reviewed and are otherwise negative except as noted above.  Physical Exam    VS:  BP 114/64   Pulse 71   Ht 5\' 5"  (1.651 m)   Wt 153 lb (69.4 kg)   SpO2 98%   BMI 25.46 kg/m  , BMI Body mass index is 25.46 kg/m.  Wt Readings from Last 3 Encounters:  03/21/22 153 lb (69.4 kg)  12/21/21 149 lb (67.6 kg)  11/11/20 154 lb 15.7 oz (70.3 kg)     GEN: Well nourished, well developed, in no acute distress. HEENT: normal. Neck: Supple, no JVD, carotid bruits, or masses. Cardiac: RRR, no murmurs, rubs, or gallops. No clubbing, cyanosis, edema.  Radials/PT 2+ and equal bilaterally.  Respiratory:  Respirations regular and  unlabored, clear to auscultation bilaterally. GI: Soft, nontender, nondistended. MS: No deformity or atrophy. Skin: Warm and dry, no rash. Neuro:  Strength and sensation are intact. Psych: Normal affect.  Assessment & Plan    CAD with atypical chest pain -He states that with activity the pain will start in his stomach and make its way to his chest.  He does not have any radiation of pain.  He does not have any associated symptoms.  The pain was relieved with nitro -With his extensive history of CAD with three separate PCI and early MI in his father we have ordered a Lexiscan Myoview. -Would like to avoid cardiac cath if possible.  -Continue GDMT: ASA 81 mg, Plavix 75 mg, Zetia 10 mg, Crestor 40 mg, Diovan 160 mg, nitroglycerin tabs as needed  Hypertension -Well-controlled today in the clinic -Continue current medication regimen -Continue to monitor your blood pressure at home  Hyperlipidemia -Recent lipid panel with LDL at goal -Continue Crestor 40 mg daily and Zetia 10 mg daily -Discussed dietary modifications and improvements -Lipid panel every 6 months per primary provider     Disposition: Follow up 1 month with 01/11/21, MD or APP.  Signed, Verne Carrow, PA-C 03/21/2022, 4:11 PM [facility]

## 2022-03-21 ENCOUNTER — Encounter: Payer: Self-pay | Admitting: *Deleted

## 2022-03-21 ENCOUNTER — Ambulatory Visit: Payer: Federal, State, Local not specified - PPO | Admitting: Physician Assistant

## 2022-03-21 ENCOUNTER — Encounter: Payer: Self-pay | Admitting: Physician Assistant

## 2022-03-21 VITALS — BP 114/64 | HR 71 | Ht 65.0 in | Wt 153.0 lb

## 2022-03-21 DIAGNOSIS — I10 Essential (primary) hypertension: Secondary | ICD-10-CM | POA: Diagnosis not present

## 2022-03-21 DIAGNOSIS — R0789 Other chest pain: Secondary | ICD-10-CM | POA: Diagnosis not present

## 2022-03-21 DIAGNOSIS — E782 Mixed hyperlipidemia: Secondary | ICD-10-CM | POA: Diagnosis not present

## 2022-03-21 MED ORDER — CLOPIDOGREL BISULFATE 75 MG PO TABS: 75.0000 mg | ORAL_TABLET | Freq: Every day | ORAL | 3 refills | Status: DC

## 2022-03-21 MED ORDER — VALSARTAN 160 MG PO TABS: 160.0000 mg | ORAL_TABLET | Freq: Every day | ORAL | 3 refills | Status: AC

## 2022-03-21 MED ORDER — ROSUVASTATIN CALCIUM 40 MG PO TABS: 40.0000 mg | ORAL_TABLET | Freq: Every evening | ORAL | 3 refills | Status: AC

## 2022-03-21 MED ORDER — EZETIMIBE 10 MG PO TABS: 10.0000 mg | ORAL_TABLET | Freq: Every evening | ORAL | 3 refills | Status: AC

## 2022-03-21 MED ORDER — NITROGLYCERIN 0.4 MG SL SUBL: SUBLINGUAL_TABLET | SUBLINGUAL | 5 refills | Status: DC

## 2022-03-21 NOTE — Patient Instructions (Signed)
Medication Instructions:  Your physician recommends that you continue on your current medications as directed. Please refer to the Current Medication list given to you today.  *If you need a refill on your cardiac medications before your next appointment, please call your pharmacy*   Lab Work: None If you have labs (blood work) drawn today and your tests are completely normal, you will receive your results only by: MyChart Message (if you have MyChart) OR A paper copy in the mail If you have any lab test that is abnormal or we need to change your treatment, we will call you to review the results.   Testing/Procedures: Your physician has requested that you have a lexiscan myoview. For further information please visit [email or web address] Please follow instruction sheet, as given.    Follow-Up: At [facility], you and your health needs are our priority.  As part of our continuing mission to provide you with exceptional heart care, we have created designated Provider Care Teams.  These Care Teams include your primary Cardiologist (physician) and Advanced Practice Providers (APPs -  Physician Assistants and Nurse Practitioners) who all work together to provide you with the care you need, when you need it.  Your next appointment:   Follow up after testing  The format for your next appointment:   In Person  Provider:   Verne Carrow, MD  or Jari Favre, PA-C       Important Information About Sugar

## 2022-03-30 ENCOUNTER — Telehealth (HOSPITAL_COMMUNITY): Payer: Self-pay | Admitting: *Deleted

## 2022-03-30 NOTE — Telephone Encounter (Signed)
Patient given detailed instructions per Myocardial Perfusion Study Information Sheet for the test on 04/05/2022 at 7:30. Patient notified to arrive 15 minutes early and that it is imperative to arrive on time for appointment to keep from having the test rescheduled.  If you need to cancel or reschedule your appointment, please call the office within 24 hours of your appointment. . Patient verbalized understanding.Alexander Calderon

## 2022-04-04 ENCOUNTER — Telehealth: Payer: Self-pay | Admitting: Physician Assistant

## 2022-04-04 NOTE — Telephone Encounter (Signed)
New Message:     Patient is scheduled for Stress Test tomorrow. He says he needs the instructions asap please. He thinks he know what to do, but not really sure.

## 2022-04-04 NOTE — Telephone Encounter (Signed)
Spoke with the patient and reviewed instructions for his stress test. Patient verbalized understanding.

## 2022-04-05 ENCOUNTER — Ambulatory Visit (HOSPITAL_COMMUNITY): Payer: Federal, State, Local not specified - PPO | Attending: Cardiology

## 2022-04-05 DIAGNOSIS — R0789 Other chest pain: Secondary | ICD-10-CM | POA: Insufficient documentation

## 2022-04-05 LAB — MYOCARDIAL PERFUSION IMAGING
LV dias vol: 83 mL (ref 62–150)
LV sys vol: 28 mL
Nuc Stress EF: 66 %
Peak HR: 88 {beats}/min
Rest HR: 68 {beats}/min
Rest Nuclear Isotope Dose: 10.7 mCi
SDS: 0
SRS: 0
SSS: 0
ST Depression (mm): 0 mm
Stress Nuclear Isotope Dose: 32.5 mCi
TID: 1.01

## 2022-04-05 MED ORDER — TECHNETIUM TC 99M TETROFOSMIN IV KIT
32.5000 | PACK | Freq: Once | INTRAVENOUS | Status: AC | PRN
  Administered 2022-04-05: 32.5 via INTRAVENOUS

## 2022-04-05 MED ORDER — TECHNETIUM TC 99M TETROFOSMIN IV KIT
10.7000 | PACK | Freq: Once | INTRAVENOUS | Status: AC | PRN
  Administered 2022-04-05: 10.7 via INTRAVENOUS

## 2022-04-05 MED ORDER — REGADENOSON 0.4 MG/5ML IV SOLN
0.4000 mg | Freq: Once | INTRAVENOUS | Status: AC
  Administered 2022-04-05: 0.4 mg via INTRAVENOUS

## 2022-04-11 NOTE — Progress Notes (Unsigned)
Office Visit    Patient Name: Alexander Calderon Date of Encounter: 04/12/2022  PCP:  Loyal Jacobson, MD   [facility]  Cardiologist:  Verne Carrow, MD  Advanced Practice Provider:  No care team member to display Electrophysiologist:  None   Chief Complaint    Alexander Calderon is a 61 y.o. male with a hx of CAD, HTN and asthma presents today for follow-up visit.  He was last seen via video visit 09/20/2020.  His CAD dates back to 2000 when he had a bare-metal stent placed in the RCA.  Cardiac catheterization 2002 with another bare-metal stent placed in RCA.  He was admitted with unstable angina 11/16/2013 at [facility] and cardiac catheterization 11/17/2013 showed severe distal RCA stenosis which was treated with a DES.  Echo 2014 with LVEF 65 to 70% with no valve disease.  When he was last seen he did not have any chest pain, dyspnea, palpitations, dizziness, near-syncope or syncope.  No lower extremity edema.  He was last seen by myself 03/21/2022 and at that time he stated  that he had a pain in his stomach and then moves up into his chest.  It was happening twice a month but has not happened in the past couple of months.  He stated that he takes a nitro and his pain is relieved.  He states does not happen after eating.  He does not have any associated shortness of breath.  With his extensive history of CAD and 3 stents placed in the past we discussed stress testing at that time.  His father actually had a heart attack at age 40 and passed away.  His first heart attack was at age 66.  His primary care gets a lipid panel every 6 months.  His last one was done in April which showed LDL 59, HDL 50, total cholesterol 440, triglycerides 103.  He has had no issues with his medications.  He has asked when he can stop his Plavix.  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  The patient presents today to review his Lexiscan Myoview results.   He states he had 1 more episode either right before or right after his last appointment with [place] that were similar to his previous episodes of pain which start in his stomach and work his way to his chest.  He denied any shortness of breath, lightheadedness, syncope, presyncope.  Otherwise, doing well from a cardiac perspective.  Reports no shortness of breath nor dyspnea on exertion.No edema, orthopnea, PND. Reports no palpitations.    Past Medical History    Past Medical History:  Diagnosis Date   Asthma    Coronary artery disease    a. history of BMS to RCA in 2000 and again in 2002. b. PTCA of the distal LCx in 2011. c. [place] 11/2013: s/p DES to distal RCA, normal LV function (done from L radial approach as pt has history of  difficulty engaging the LCA and significant radial spasm from R radial approach).   Depression    GERD (gastroesophageal reflux disease)    Hyperlipidemia    Hypertension    Type 2 diabetes mellitus (HCC)    Past Surgical History:  Procedure Laterality Date   APPENDECTOMY     CHOLECYSTECTOMY N/A 11/11/2020   Procedure: LAPAROSCOPIC CHOLECYSTECTOMY WITH  INTRAOPERATIVE CHOLANGIOGRAM;  Surgeon: Luretha Murphy, MD;  Location: [facility];  Service: General;  Laterality: N/A;  CORONARY ANGIOPLASTY WITH STENT PLACEMENT     LEFT HEART CATHETERIZATION WITH CORONARY ANGIOGRAM N/A 11/17/2013   Procedure: LEFT HEART CATHETERIZATION WITH CORONARY ANGIOGRAM;  Surgeon: Peter M Swaz[place]and, MD;  Location: [facility] CATH LAB;  Service: Cardiovascular;  Laterality: N/A;    Allergies  Allergies  Allergen Reactions   Dextrans Nausea Only     EKGs/Labs/Other Studies Reviewed:   The following studies were reviewed today:  Lexiscan Myoview 04/05/2022    The study is normal. The study is low risk.   No ST deviation was noted. There were no arrhythmias during recovery.   LV perfusion is normal. There is no evidence of ischemia. There is no evidence of infarction.   Left ventricular function  is normal. Nuclear stress EF: 66 %. The left ventricular ejection fraction is hyperdynamic (>65%). End diastolic cavity size is normal. End systolic cavity size is normal.   Prior study not available for comparison.  Echocardiogram: 06/2013 Left ventricle: The cavity size was normal. Wall thickness    was increased in a pattern of mild LVH. Systolic function    was vigorous. The estimated ejection fraction was in the    range of 65% to 70%. Wall motion was normal; there were no    regional wall motion abnormalities.  - Atrial septum: No defect or patent foramen ovale was    identified.   EKG:  EKG is not ordered today.   Recent Labs: No results found for requested labs within last 365 days.  Recent Lipid Panel    Component Value Date/Time   CHOL 141 11/17/2013 0255   TRIG 289 (H) 11/17/2013 0255   HDL 43 11/17/2013 0255   CHOLHDL 3.3 11/17/2013 0255   VLDL 58 (H) 11/17/2013 0255   LDLCALC 40 11/17/2013 0255    Home Medications   Current Meds  Medication Sig   albuterol (PROVENTIL HFA;VENTOLIN HFA) 108 (90 BASE) MCG/ACT inhaler Inhale 2 puffs into the lungs every 6 (six) hours as needed for wheezing.   aspirin 81 MG tablet Take 81 mg by mouth daily.   Azelastine HCl 0.15 % SOLN Place 1 spray into the nose as needed (allergies).    citalopram (CELEXA) 40 MG tablet Take 40 mg by mouth daily as needed (mood).   clopidogrel (PLAVIX) 75 MG tablet Take 1 tablet (75 mg total) by mouth daily.   ezetimibe (ZETIA) 10 MG tablet Take 1 tablet (10 mg total) by mouth at bedtime.   ferrous sulfate 325 (65 FE) MG tablet Take 325 mg by mouth 2 (two) times daily.   fluticasone (FLONASE) 50 MCG/ACT nasal spray Place 1 spray into both nostrils as needed for allergies.    hyoscyamine (LEVBID) 0.375 MG 12 hr tablet Take 0.375 mg by mouth 2 (two) times daily.   LORazepam (ATIVAN) 1 MG tablet Take 1 mg by mouth every 8 (eight) hours as needed for anxiety.   metFORMIN (GLUCOPHAGE-XR) 500 MG 24 hr  tablet Take 1 tablet (500 mg total) by mouth daily.   mirtazapine (REMERON) 45 MG tablet Take 45 mg by mouth at bedtime.   nitroGLYCERIN (NITROSTAT) 0.4 MG SL tablet DISSOLVE ONE TABLET UNDER TONGUE EVERY 5 MINUTES AS NEEDED UP TO 3 DOSES. IF NO RELIEF CALL 911.   omeprazole (PRILOSEC) 40 MG capsule Take 1 capsule by mouth daily.   polycarbophil (FIBERCON) 625 MG tablet Take 1 tablet (625 mg total) by mouth 2 (two) times daily.   rosuvastatin (CRESTOR) 40 MG tablet Take 1 tablet (40 mg total)  by mouth at bedtime.   sertraline (ZOLOFT) 100 MG tablet Take 1 tablet by mouth daily.   SINGULAIR 10 MG tablet Take 10 mg by mouth daily.    sitaGLIPtin (JANUVIA) 100 MG tablet Take 100 mg by mouth daily.   valsartan (DIOVAN) 160 MG tablet Take 1 tablet (160 mg total) by mouth daily.     Review of Systems      All other systems reviewed and are otherwise negative except as noted above.  Physical Exam    VS:  BP 116/78   Pulse 86   Ht 5\' 5"  (1.651 m)   Wt 157 lb 9.6 oz (71.5 kg)   SpO2 96%   BMI 26.23 kg/m  , BMI Body mass index is 26.23 kg/m.  Wt Readings from Last 3 Encounters:  04/12/22 157 lb 9.6 oz (71.5 kg)  04/05/22 153 lb (69.4 kg)  03/21/22 153 lb (69.4 kg)     GEN: Well nourished, well developed, in no acute distress. HEENT: normal. Neck: Supple, no JVD, carotid bruits, or masses. Cardiac: RRR, no murmurs, rubs, or gallops. No clubbing, cyanosis, edema.  Radials/PT 2+ and equal bilaterally.  Respiratory:  Respirations regular and unlabored, clear to auscultation bilaterally. GI: Soft, nontender, nondistended. MS: No deformity or atrophy. Skin: Warm and dry, no rash. Neuro:  Strength and sensation are intact. Psych: Normal affect.  Assessment & Plan    CAD with atypical chest pain -Status post bare-metal stent placed in the RCA in 2000 and 2002.  DES to distal RCA in 2015.  Remains on dual antiplatelet therapy with Plavix/aspirin (patient would like to discontinue one of  these) -He states that with activity the pain will start in his stomach and make its way to his chest.  He does not have any radiation of pain.  He does not have any associated symptoms.  The pain was relieved with nitro.  He had one more episode since he was last seen in the office -Lexiscan Myoview showed no ST deviation.  Normal low risk study.  No evidence of ischemia or infarction.  EF 66% -Continue GDMT: ASA 81 mg, Plavix 75 mg, Zetia 10 mg, Crestor 40 mg, Diovan 160 mg, nitroglycerin tabs as needed  Hypertension -Well-controlled today in the clinic -Continue current medication regimen -Continue to monitor your blood pressure at home  Hyperlipidemia -Recent lipid panel with LDL at goal -Continue Crestor 40 mg daily and Zetia 10 mg daily -Discussed dietary modifications and improvements -Lipid panel every 6 months per primary provider     Disposition: Follow up 1 year  with Lauree Chandler, MD or APP.  Signed, Elgie Collard, PA-C 04/12/2022, 3:44 PM [facility]

## 2022-04-12 ENCOUNTER — Ambulatory Visit (INDEPENDENT_AMBULATORY_CARE_PROVIDER_SITE_OTHER): Payer: Federal, State, Local not specified - PPO | Admitting: Physician Assistant

## 2022-04-12 ENCOUNTER — Encounter: Payer: Self-pay | Admitting: Physician Assistant

## 2022-04-12 VITALS — BP 116/78 | HR 86 | Ht 65.0 in | Wt 157.6 lb

## 2022-04-12 DIAGNOSIS — I2510 Atherosclerotic heart disease of native coronary artery without angina pectoris: Secondary | ICD-10-CM | POA: Diagnosis not present

## 2022-04-12 DIAGNOSIS — R0789 Other chest pain: Secondary | ICD-10-CM | POA: Diagnosis not present

## 2022-04-12 DIAGNOSIS — E785 Hyperlipidemia, unspecified: Secondary | ICD-10-CM

## 2022-04-12 DIAGNOSIS — I10 Essential (primary) hypertension: Secondary | ICD-10-CM

## 2022-04-12 NOTE — Patient Instructions (Signed)
Medication Instructions:  Your physician recommends that you continue on your current medications as directed. Please refer to the Current Medication list given to you today.  *If you need a refill on your cardiac medications before your next appointment, please call your pharmacy*   Lab Work: None If you have labs (blood work) drawn today and your tests are completely normal, you will receive your results only by: MyChart Message (if you have MyChart) OR A paper copy in the mail If you have any lab test that is abnormal or we need to change your treatment, we will call you to review the results.  Follow-Up: At [facility], you and your health needs are our priority.  As part of our continuing mission to provide you with exceptional heart care, we have created designated Provider Care Teams.  These Care Teams include your primary Cardiologist (physician) and Advanced Practice Providers (APPs -  Physician Assistants and Nurse Practitioners) who all work together to provide you with the care you need, when you need it.  Your next appointment:   1 year(s)  The format for your next appointment:   In Person  Provider:   Verne Carrow, MD  or Jari Favre, PA-C     {  Important Information About Sugar

## 2022-04-27 NOTE — Telephone Encounter (Signed)
Spoke with patient to inform him of message from [place], [place].  Informed him that she had spoke with Dr. Clifton James and he states patient may stop the Plavix, but continue the aspirin. Also to let us know if he has any questions.  Patient verbalized understanding and expressed appreciation for follow-up call.

## 2022-08-15 ENCOUNTER — Telehealth: Payer: Self-pay | Admitting: Cardiovascular Disease

## 2022-08-15 NOTE — Telephone Encounter (Signed)
Will send this information to Dr. Clifton James as an Lorain Childes.  Pt was scheduled an appt with Dr. Clifton James for 09/22/22 at 3 pm, for post-hospital follow-up.  Scheduling dept arranged this appt.

## 2022-08-15 NOTE — Telephone Encounter (Signed)
Patient was in the hospital while out of town. The hospital called to get him a f/u appt with our office. States that if Dr. Clifton James will like update on the patient states he can call Ishmael Holter at [phone]

## 2022-08-20 ENCOUNTER — Encounter: Payer: Self-pay | Admitting: Cardiovascular Disease

## 2022-09-22 ENCOUNTER — Encounter: Payer: Self-pay | Admitting: Cardiovascular Disease

## 2022-09-22 ENCOUNTER — Ambulatory Visit: Payer: Federal, State, Local not specified - PPO | Attending: Cardiovascular Disease | Admitting: Cardiovascular Disease

## 2022-09-22 VITALS — BP 116/66 | HR 80 | Ht 65.0 in | Wt 152.0 lb

## 2022-09-22 DIAGNOSIS — I10 Essential (primary) hypertension: Secondary | ICD-10-CM

## 2022-09-22 DIAGNOSIS — E782 Mixed hyperlipidemia: Secondary | ICD-10-CM | POA: Diagnosis not present

## 2022-09-22 DIAGNOSIS — I2510 Atherosclerotic heart disease of native coronary artery without angina pectoris: Secondary | ICD-10-CM | POA: Diagnosis not present

## 2022-09-22 MED ORDER — METOPROLOL SUCCINATE ER 25 MG PO TB24: 25.0000 mg | ORAL_TABLET | Freq: Every day | ORAL | 3 refills | Status: DC

## 2022-09-22 MED ORDER — CLOPIDOGREL BISULFATE 75 MG PO TABS: 75.0000 mg | ORAL_TABLET | Freq: Every day | ORAL | 3 refills | Status: DC

## 2022-09-22 NOTE — Progress Notes (Signed)
Chief Complaint  Patient presents with   Follow-up    CAD   History of Present Illness: 62 yo male with history of CAD, HTN and asthma here today for cardiac follow up. His CAD dates back to 2000 when he had a bare metal stent placed in the RCA. Cardiac cath in 2002 with another bare metal stent placed in the RCA. He was admitted with unstable angina in March 2015 and cardiac cath showed severe distal RCA stenosis which was treated with a Promus drug eluting stent. Echo in 2014 with LVEF=65-70% with no valve disease. He was seen in July 2023 by Nicholes Rough, PA and reported abdominal pain with radiation to his chest. Nuclear stress test August 2023 with no ischemia. He was in [place] at a [employer] game in 08/13/22 and began to have chest pain. EMS was called and EKG was consistent with an inferior ST elevation MI. He was take to the [facility] and cardiac cath showed total occlusion of the right PDA and in stent restenosis proximal RCA stent. A 3.0 x 23 mm Xience drug eluting stent was placed in the PDA and a 4.0 x 33 mm Xience drug eluting stent was placed in the proximal RCA. Moderately severe LAD stenosis not treated. Echo with LVEF=50-55% with basal to mid inferior hypokinesis. He was discharged on ASA and Plavix. LDL 66   He is here today for follow up. The patient denies any chest pain, dyspnea, palpitations, lower extremity edema, orthopnea, PND, dizziness, near syncope or syncope.    Primary Care Physician: Jefm Petty, MD   Past Medical History:  Diagnosis Date   Asthma    Coronary artery disease    a. history of BMS to RCA in 2000 and again in 2002. b. PTCA of the distal LCx in 2011. c. [place] 11/2013: s/p DES to distal RCA, normal LV function (done from L radial approach as pt has history of  difficulty engaging the LCA and significant radial spasm from R radial approach).   Depression    GERD (gastroesophageal reflux disease)    Hyperlipidemia     Hypertension    Type 2 diabetes mellitus ([place])     Past Surgical History:  Procedure Laterality Date   APPENDECTOMY     CHOLECYSTECTOMY N/A 11/11/2020   Procedure: LAPAROSCOPIC CHOLECYSTECTOMY WITH  INTRAOPERATIVE CHOLANGIOGRAM;  Surgeon: Johnathan Hausen, MD;  Location: [facility];  Service: General;  Laterality: N/A;   CORONARY ANGIOPLASTY WITH STENT PLACEMENT     LEFT HEART CATHETERIZATION WITH CORONARY ANGIOGRAM N/A 11/17/2013   Procedure: LEFT HEART CATHETERIZATION WITH CORONARY ANGIOGRAM;  Surgeon: Peter M Martinique, MD;  Location: [facility] C[facility] LAB;  Service: Cardiovascular;  Laterality: N/A;    Current Outpatient Medications  Medication Sig Dispense Refill   albuterol (PROVENTIL HFA;VENTOLIN HFA) 108 (90 BASE) MCG/ACT inhaler Inhale 2 puffs into the lungs every 6 (six) hours as needed for wheezing.     aspirin 81 MG tablet Take 81 mg by mouth daily.     Azelastine HCl 0.15 % SOLN Place 1 spray into the nose as needed (allergies).      citalopram (CELEXA) 40 MG tablet Take 40 mg by mouth daily as needed (mood).     colchicine 0.6 MG tablet Take 0.6 mg by mouth daily.     ezetimibe (ZETIA) 10 MG tablet Take 1 tablet (10 mg total) by mouth at bedtime. 90 tablet 3   ferrous sulfate 325 (65 FE) MG tablet Take 325 mg  by mouth 2 (two) times daily.     fluticasone (FLONASE) 50 MCG/ACT nasal spray Place 1 spray into both nostrils as needed for allergies.      hyoscyamine (LEVBID) 0.375 MG 12 hr tablet Take 0.375 mg by mouth 2 (two) times daily.     LORazepam (ATIVAN) 1 MG tablet Take 1 mg by mouth every 8 (eight) hours as needed for anxiety.     metFORMIN (GLUCOPHAGE-XR) 500 MG 24 hr tablet Take 1 tablet (500 mg total) by mouth daily.     mirtazapine (REMERON) 45 MG tablet Take 45 mg by mouth at bedtime.     nitroGLYCERIN (NITROSTAT) 0.4 MG SL tablet DISSOLVE ONE TABLET UNDER TONGUE EVERY 5 MINUTES AS NEEDED UP TO 3 DOSES. IF NO RELIEF CALL 911. 25 tablet 5   omeprazole (PRILOSEC) 40 MG capsule Take 1  capsule by mouth daily.     polycarbophil (FIBERCON) 625 MG tablet Take 1 tablet (625 mg total) by mouth 2 (two) times daily. 60 tablet 0   rosuvastatin (CRESTOR) 40 MG tablet Take 1 tablet (40 mg total) by mouth at bedtime. 90 tablet 3   sertraline (ZOLOFT) 100 MG tablet Take 1 tablet by mouth daily.     SINGULAIR 10 MG tablet Take 10 mg by mouth daily.      sitaGLIPtin (JANUVIA) 100 MG tablet Take 100 mg by mouth daily.     valsartan (DIOVAN) 160 MG tablet Take 1 tablet (160 mg total) by mouth daily. 90 tablet 3   clopidogrel (PLAVIX) 75 MG tablet Take 1 tablet (75 mg total) by mouth daily. 90 tablet 3   metoprolol succinate (TOPROL-XL) 25 MG 24 hr tablet Take 1 tablet (25 mg total) by mouth daily. 90 tablet 3   No current facility-administered medications for this visit.    Allergies  Allergen Reactions   Dextrans Nausea Only    Social History   Socioeconomic History   Marital status: Married    Spouse name: Not on file   Number of children: 2   Years of education: Not on file   Highest education level: Not on file  Occupational History    Employer: [employer]  Tobacco Use   Smoking status: Never   Smokeless tobacco: Never  Vaping Use   Vaping Use: Never used  Substance and Sexual Activity   Alcohol use: Yes    Alcohol/week: 1.0 standard drink of alcohol    Types: 1 drink(s) per week   Drug use: No   Sexual activity: Not on file  Other Topics Concern   Not on file  Social History Narrative   [place] . He works at the post office. He denies tobacco use, drinks alcohol 2-3 times per week, and uses energy supplements occasionally   Social Determinants of Health   Financial Resource Strain: Not on file  Food Insecurity: Not on file  Transportation Needs: Not on file  Physical Activity: Not on file  Stress: Not on file  Social Connections: Not on file  Intimate Partner Violence: Not on file    Family History   Problem Relation Age of Onset   Heart attack Father        age 47   Coronary artery disease Brother        onset  CAD age 61   Review of Systems:  As stated in the HPI and otherwise negative.   BP 116/66   Pulse 80  Ht 5\' 5"  (1.651 m)   Wt 68.9 kg   SpO2 98%   BMI 25.29 kg/m   Physical Examination:  General: Well developed, well nourished, NAD  HEENT: OP clear, mucus membranes moist  SKIN: warm, dry. No rashes. Neuro: No focal deficits  Musculoskeletal: Muscle strength 5/5 all ext  Psychiatric: Mood and affect normal  Neck: No JVD, no carotid bruits, no thyromegaly, no lymphadenopathy.  Lungs:Clear bilaterally, no wheezes, rhonci, crackles Cardiovascular: Regular rate and rhythm. No murmurs, gallops or rubs. Abdomen:Soft. Bowel sounds present. Non-tender.  Extremities: No lower extremity edema. Pulses are 2 + in the bilateral DP/PT.  EKG:  EKG is  not ordered today. The ekg ordered today demonstrates   Recent Labs: No results found for requested labs within last 365 days.   Lipid Panel    Component Value Date/Time   CHOL 141 11/17/2013 0255   TRIG 289 (H) 11/17/2013 0255   HDL 43 11/17/2013 0255   CHOLHDL 3.3 11/17/2013 0255   VLDL 58 (H) 11/17/2013 0255   LDLCALC 40 11/17/2013 0255     Wt Readings from Last 3 Encounters:  09/22/22 68.9 kg  04/12/22 71.5 kg  04/05/22 69.4 kg     Assessment and Plan:   1. CAD without angina: Recent inferior STEMI in [place]. No chest pain since leaving the hospital there. Continue ASA, Plavix, statin, Zetia, Toprol and Diovan.  I will see him back in 3 months and if he is having any angina, will plan repeat cardiac then to evaluate his LAD lesion.   2. HTN: BP is well controlled. No changes today  3. HLD: Lipids followed in primary care. LDL 66 in December 2023 (in [place]). Continue statin.     Labs/ tests ordered today include:  No orders of the defined types were placed in this encounter.  Disposition:   FU with  me in 12  months  Signed, Lauree Chandler, MD 09/22/2022 3:21 PM    [facility] [place], [place], [place]  [address] Phone: [phone]; Fax: [phone]) [phone]    .

## 2022-09-22 NOTE — Patient Instructions (Signed)
Medication Instructions:  No changes *If you need a refill on your cardiac medications before your next appointment, please call your pharmacy*   Lab Work: none If you have labs (blood work) drawn today and your tests are completely normal, you will receive your results only by: [place] (if you have MyChart) OR A paper copy in the mail If you have any lab test that is abnormal or we need to change your treatment, we will call you to review the results.   Testing/Procedures: none   Follow-Up: At [facility], you and your health needs are our priority.  As part of our continuing mission to provide you with exceptional heart care, we have created designated Provider Care Teams.  These Care Teams include your primary Cardiologist (physician) and Advanced Practice Providers (APPs -  Physician Assistants and Nurse Practitioners) who all work together to provide you with the care you need, when you need it.  Your next appointment:   3 month(s)  Provider:   Lauree Chandler, MD

## 2023-01-01 ENCOUNTER — Ambulatory Visit: Payer: Federal, State, Local not specified - PPO | Attending: Cardiovascular Disease | Admitting: Cardiovascular Disease

## 2023-01-01 VITALS — BP 118/68 | HR 66 | Ht 65.0 in | Wt 148.4 lb

## 2023-01-01 DIAGNOSIS — Z01812 Encounter for preprocedural laboratory examination: Secondary | ICD-10-CM

## 2023-01-01 DIAGNOSIS — I25110 Atherosclerotic heart disease of native coronary artery with unstable angina pectoris: Secondary | ICD-10-CM | POA: Diagnosis not present

## 2023-01-01 DIAGNOSIS — I10 Essential (primary) hypertension: Secondary | ICD-10-CM

## 2023-01-01 DIAGNOSIS — E782 Mixed hyperlipidemia: Secondary | ICD-10-CM | POA: Diagnosis not present

## 2023-01-01 NOTE — H&P (View-Only) (Signed)
Chief Complaint  Patient presents with   Follow-up    CAD   History of Present Illness: 62 yo male with history of CAD, HTN and asthma here today for cardiac follow up. His CAD dates back to 2000 when he had a bare metal stent placed in the RCA. Cardiac cath in 2002 with another bare metal stent placed in the RCA. He was admitted with unstable angina in March 2015 and cardiac cath showed severe distal RCA stenosis which was treated with a Promus drug eluting stent. Echo in 2014 with LVEF=65-70% with no valve disease. He was seen in July 2023 by Jari Favre, PA and reported abdominal pain with radiation to his chest. Nuclear stress test August 2023 with no ischemia. He was in [place] a [place] in 08[place]13/22 and began to have chest pain. EMS was called and EKG was consistent with an inferior ST elevation MI. He was take to the [facility] and cardiac cath showed total occlusion of the right PDA and in stent restenosis proximal RCA stent. A 3.0 x 23 mm Xience drug eluting stent was placed in the PDA and a 4.0 x 33 mm Xience drug eluting stent was placed in the proximal RCA. Moderately severe LAD stenosis not treated. Echo with LVEF=50-55% with basal to mid inferior hypokinesis. He was discharged on ASA and Plavix. LDL 66   He is here today for follow up. He is now having pain in his epigastric area that radiates to his chest. Relieved with NTG. Occurs with exertion. The patient denies any dyspnea, palpitations, lower extremity edema, orthopnea, PND, dizziness, near syncope or syncope.    Primary Care Physician: Loyal Jacobson, MD   Past Medical History:  Diagnosis Date   Asthma    Coronary artery disease    a. history of BMS to RCA in 2000 and again in 2002. b. PTCA of the distal LCx in 2011. c. [place] 11/2013: s/p DES to distal RCA, normal LV function (done from L radial approach as pt has history of  difficulty engaging the LCA and significant radial spasm from R  radial approach).   Depression    GERD (gastroesophageal reflux disease)    Hyperlipidemia    Hypertension    Type 2 diabetes mellitus (HCC)     Past Surgical History:  Procedure Laterality Date   APPENDECTOMY     CHOLECYSTECTOMY N/A 11/11/2020   Procedure: LAPAROSCOPIC CHOLECYSTECTOMY WITH  INTRAOPERATIVE CHOLANGIOGRAM;  Surgeon: Luretha Murphy, MD;  Location: [facility];  Service: General;  Laterality: N/A;   CORONARY ANGIOPLASTY WITH STENT PLACEMENT     LEFT HEART CATHETERIZATION WITH CORONARY ANGIOGRAM N/A 11/17/2013   Procedure: LEFT HEART CATHETERIZATION WITH CORONARY ANGIOGRAM;  Surgeon: Peter M [place], MD;  Location: [facility] CATH LAB;  Service: Cardiovascular;  Laterality: N/A;    Current Outpatient Medications  Medication Sig Dispense Refill   albuterol (PROVENTIL HFA;VENTOLIN HFA) 108 (90 BASE) MCG/ACT inhaler Inhale 2 puffs into the lungs every 6 (six) hours as needed for wheezing.     aspirin 81 MG tablet Take 81 mg by mouth daily.     Azelastine HCl 0.15 % SOLN Place 1 spray into the nose as needed (allergies).      citalopram (CELEXA) 40 MG tablet Take 40 mg by mouth daily as needed (mood).     clopidogrel (PLAVIX) 75 MG tablet Take 1 tablet (75 mg total) by mouth daily. 90 tablet 3   colchicine 0.6 MG tablet Take 0.6 mg  by mouth daily.     ezetimibe (ZETIA) 10 MG tablet Take 1 tablet (10 mg total) by mouth at bedtime. 90 tablet 3   ferrous sulfate 325 (65 FE) MG tablet Take 325 mg by mouth 2 (two) times daily.     fluticasone (FLONASE) 50 MCG/ACT nasal spray Place 1 spray into both nostrils as needed for allergies.      hyoscyamine (LEVBID) 0.375 MG 12 hr tablet Take 0.375 mg by mouth 2 (two) times daily.     LORazepam (ATIVAN) 1 MG tablet Take 1 mg by mouth every 8 (eight) hours as needed for anxiety.     metFORMIN (GLUCOPHAGE-XR) 500 MG 24 hr tablet Take 1 tablet (500 mg total) by mouth daily.     metoprolol succinate (TOPROL-XL) 25 MG 24 hr tablet Take 1 tablet (25 mg total)  by mouth daily. 90 tablet 3   mirtazapine (REMERON) 45 MG tablet Take 45 mg by mouth at bedtime.     nitroGLYCERIN (NITROSTAT) 0.4 MG SL tablet DISSOLVE ONE TABLET UNDER TONGUE EVERY 5 MINUTES AS NEEDED UP TO 3 DOSES. IF NO RELIEF CALL 911. 25 tablet 5   omeprazole (PRILOSEC) 40 MG capsule Take 1 capsule by mouth daily.     polycarbophil (FIBERCON) 625 MG tablet Take 1 tablet (625 mg total) by mouth 2 (two) times daily. 60 tablet 0   rosuvastatin (CRESTOR) 40 MG tablet Take 1 tablet (40 mg total) by mouth at bedtime. 90 tablet 3   sertraline (ZOLOFT) 100 MG tablet Take 1 tablet by mouth daily.     SINGULAIR 10 MG tablet Take 10 mg by mouth daily.      sitaGLIPtin (JANUVIA) 100 MG tablet Take 100 mg by mouth daily.     valsartan (DIOVAN) 160 MG tablet Take 1 tablet (160 mg total) by mouth daily. 90 tablet 3   No current facility-administered medications for this visit.    Allergies  Allergen Reactions   Dextrans Nausea Only    Social History   Socioeconomic History   Marital status: Married    Spouse name: Not on file   Number of children: 2   Years of education: Not on file   Highest education level: Not on file  Occupational History    Employer: [employer]  Tobacco Use   Smoking status: Never   Smokeless tobacco: Never  Vaping Use   Vaping Use: Never used  Substance and Sexual Activity   Alcohol use: Yes    Alcohol/week: 1.0 standard drink of alcohol    Types: 1 drink(s) per week   Drug use: No   Sexual activity: Not on file  Other Topics Concern   Not on file  Social History Narrative   [place] . He works at the post office. He denies tobacco use, drinks alcohol 2-3 times per week, and uses energy supplements occasionally   Social Determinants of Health   Financial Resource Strain: Not on file  Food Insecurity: Not on file  Transportation Needs: Not on file  Physical Activity: Not on file  Stress: Not on file   Social Connections: Not on file  Intimate Partner Violence: Not on file    Family History  Problem Relation Age of Onset   Heart attack Father        age 60   Coronary artery disease Brother        onset  CAD age 77   Review of Systems:  As stated in the HPI and otherwise negative.   BP 118/68 (BP Location: Right Arm, Patient Position: Sitting, Cuff Size: Normal)   Pulse 66   Ht 5\' 5"  (1.651 m)   Wt 67.3 kg   SpO2 97%   BMI 24.70 kg/m   Physical Examination:  General: Well developed, well nourished, NAD  HEENT: OP clear, mucus membranes moist  SKIN: warm, dry. No rashes. Neuro: No focal deficits  Musculoskeletal: Muscle strength 5/5 all ext  Psychiatric: Mood and affect normal  Neck: No JVD, no carotid bruits, no thyromegaly, no lymphadenopathy.  Lungs:Clear bilaterally, no wheezes, rhonci, crackles Cardiovascular: Regular rate and rhythm. No murmurs, gallops or rubs. Abdomen:Soft. Bowel sounds present. Non-tender.  Extremities: No lower extremity edema. Pulses are 2 + in the bilateral DP/PT.  EKG:  EKG is  ordered today. The ekg ordered today demonstrates NSR  Recent Labs: No results found for requested labs within last 365 days.   Lipid Panel    Component Value Date/Time   CHOL 141 11/17/2013 0255   TRIG 289 (H) 11/17/2013 0255   HDL 43 11/17/2013 0255   CHOLHDL 3.3 11/17/2013 0255   VLDL 58 (H) 11/17/2013 0255   LDLCALC 40 11/17/2013 0255     Wt Readings from Last 3 Encounters:  01/01/23 67.3 kg  09/22/22 68.9 kg  04/12/22 71.5 kg     Assessment and Plan:   1. CAD with unstable angina: Recent inferior STEMI in [place] in December 2023. Moderate to severe LAD stenosis was not treated. He is now having unstable angina. Will plan cardiac cath with possible PCI on 01/04/23 at 9am at [facility]. Will continue ASA, Plavix, statin , Zetia, Toprol and Diovan.    I have reviewed the risks, indications, and alternatives to cardiac catheterization, possible  angioplasty, and stenting with the patient. Risks include but are not limited to bleeding, infection, vascular injury, stroke, myocardial infection, arrhythmia, kidney injury, radiation-related injury in the case of prolonged fluoroscopy use, emergency cardiac surgery, and death. The patient understands the risks of serious complication is 1-2 in 1000 with diagnostic cardiac cath and 1-2% or less with angioplasty/stenting.    2. HTN: BP is controlled. No changes today  3. HLD: Lipids followed in primary care. LDL 66 in December 2023 (in [place]). Continue statin.   Labs/ tests ordered today include:   Orders Placed This Encounter  Procedures   Basic metabolic panel   CBC   EKG 12-Lead   Disposition:   F/U with me in 12  months  Signed, Verne Carrow, MD 01/01/2023 3:39 PM    [facility] [address] [place], [place], [place]  [address] Phone: [phone]; Fax: [phone]    .

## 2023-01-01 NOTE — Patient Instructions (Signed)
Medication Instructions:  No changes *If you need a refill on your cardiac medications before your next appointment, please call your pharmacy*   Lab Work: Today: cbc, bmet   Testing/Procedures: Your physician has requested that you have a cardiac catheterization. Cardiac catheterization is used to diagnose and/or treat various heart conditions. Doctors may recommend this procedure for a number of different reasons. The most common reason is to evaluate chest pain. Chest pain can be a symptom of coronary artery disease (CAD), and cardiac catheterization can show whether plaque is narrowing or blocking your heart's arteries. This procedure is also used to evaluate the valves, as well as measure the blood flow and oxygen levels in different parts of your heart. For further information please visit [email or web address] Please follow instruction sheet, as given.   Follow-Up: At [facility], you and your health needs are our priority.  As part of our continuing mission to provide you with exceptional heart care, we have created designated Provider Care Teams.  These Care Teams include your primary Cardiologist (physician) and Advanced Practice Providers (APPs -  Physician Assistants and Nurse Practitioners) who all work together to provide you with the care you need, when you need it.     Your next appointment:   3 week(s)  Provider:   Advanced Practice Provider (NP or PA-C)  Other Instructions       Cardiac/Peripheral Catheterization   You are scheduled for a Cardiac Catheterization on Thursday, May 2 with Dr. Verne Carrow.  1. Please arrive at the [facility] (Main Entrance A) at [facility]: [address] [place], [place] [address] at 7:00 AM (This time is TWO hour(s) before your procedure to ensure your preparation). Free valet parking service is available. You will check in at ADMITTING. The support person will be asked to wait in the waiting room.  It is  OK to have someone drop you off and come back when you are ready to be discharged.        Special note: Every effort is made to have your procedure done on time. Please understand that emergencies sometimes delay scheduled procedures.  2. Diet: Do not eat solid foods after midnight.  You may have clear liquids until 5 AM the day of the procedure.  3. Labs: You will need to have blood drawn today You do not need to be fasting.  4. Medication instructions in preparation for your procedure:   Contrast Allergy: No   Do not take Diabetes Med Glucophage (Metformin) on the day of the procedure and HOLD 48 HOURS AFTER THE PROCEDURE.  On the morning of your procedure, take Aspirin 81 mg and Plavix/Clopidogrel and any morning medicines NOT listed above.  You may use sips of water.  5. Plan to go home the same day, you will only stay overnight if medically necessary. 6. You MUST have a responsible adult to drive you home. 7. An adult MUST be with you the first 24 hours after you arrive home. 8. Bring a current list of your medications, and the last time and date medication taken. 9. Bring ID and current insurance cards. 10.Please wear clothes that are easy to get on and off and wear slip-on shoes.  Thank you for allowing [place] to care for you!   -- [facility]

## 2023-01-01 NOTE — Progress Notes (Signed)
 Chief Complaint  Patient presents with   Follow-up    CAD   History of Present Illness: 62 yo male with history of CAD, HTN and asthma here today for cardiac follow up. His CAD dates back to 2000 when he had a bare metal stent placed in the RCA. Cardiac cath in 2002 with another bare metal stent placed in the RCA. He was admitted with unstable angina in March 2015 and cardiac cath showed severe distal RCA stenosis which was treated with a Promus drug eluting stent. Echo in 2014 with LVEF=65-70% with no valve disease. He was seen in July 2023 by Tessa Conte, PA and reported abdominal pain with radiation to his chest. Nuclear stress test August 2023 with no ischemia. He was in [place] a [place] in [place]13[place]22 and began to have chest pain. EMS was called and EKG was consistent with an inferior ST elevation MI. He was take to the [facility] and cardiac cath showed total occlusion of the right PDA and in stent restenosis proximal RCA stent. A 3.0 x 23 mm Xience drug eluting stent was placed in the PDA and a 4.0 x 33 mm Xience drug eluting stent was placed in the proximal RCA. Moderately severe LAD stenosis not treated. Echo with LVEF=50-55% with basal to mid inferior hypokinesis. He was discharged on ASA and Plavix. LDL 66   He is here today for follow up. He is now having pain in his epigastric area that radiates to his chest. Relieved with NTG. Occurs with exertion. The patient denies any dyspnea, palpitations, lower extremity edema, orthopnea, PND, dizziness, near syncope or syncope.    Primary Care Physician: Kalish, Maurisio, MD   Past Medical History:  Diagnosis Date   Asthma    Coronary artery disease    a. history of BMS to RCA in 2000 and again in 2002. b. PTCA of the distal LCx in 2011. c. [place] 11/2013: s/p DES to distal RCA, normal LV function (done from L radial approach as pt has history of  difficulty engaging the LCA and significant radial spasm from R  radial approach).   Depression    GERD (gastroesophageal reflux disease)    Hyperlipidemia    Hypertension    Type 2 diabetes mellitus (HCC)     Past Surgical History:  Procedure Laterality Date   APPENDECTOMY     CHOLECYSTECTOMY N/A 11/11/2020   Procedure: LAPAROSCOPIC CHOLECYSTECTOMY WITH  INTRAOPERATIVE CHOLANGIOGRAM;  Surgeon: Martin, Matthew, MD;  Location: [facility];  Service: General;  Laterality: N/A;   CORONARY ANGIOPLASTY WITH STENT PLACEMENT     LEFT HEART CATHETERIZATION WITH CORONARY ANGIOGRAM N/A 11/17/2013   Procedure: LEFT HEART CATHETERIZATION WITH CORONARY ANGIOGRAM;  Surgeon: Peter M Jordan, MD;  Location: [facility] CATH LAB;  Service: Cardiovascular;  Laterality: N/A;    Current Outpatient Medications  Medication Sig Dispense Refill   albuterol (PROVENTIL HFA;VENTOLIN HFA) 108 (90 BASE) MCG/ACT inhaler Inhale 2 puffs into the lungs every 6 (six) hours as needed for wheezing.     aspirin 81 MG tablet Take 81 mg by mouth daily.     Azelastine HCl 0.15 % SOLN Place 1 spray into the nose as needed (allergies).      citalopram (CELEXA) 40 MG tablet Take 40 mg by mouth daily as needed (mood).     clopidogrel (PLAVIX) 75 MG tablet Take 1 tablet (75 mg total) by mouth daily. 90 tablet 3   colchicine 0.6 MG tablet Take 0.6 mg   by mouth daily.     ezetimibe (ZETIA) 10 MG tablet Take 1 tablet (10 mg total) by mouth at bedtime. 90 tablet 3   ferrous sulfate 325 (65 FE) MG tablet Take 325 mg by mouth 2 (two) times daily.     fluticasone (FLONASE) 50 MCG/ACT nasal spray Place 1 spray into both nostrils as needed for allergies.      hyoscyamine (LEVBID) 0.375 MG 12 hr tablet Take 0.375 mg by mouth 2 (two) times daily.     LORazepam (ATIVAN) 1 MG tablet Take 1 mg by mouth every 8 (eight) hours as needed for anxiety.     metFORMIN (GLUCOPHAGE-XR) 500 MG 24 hr tablet Take 1 tablet (500 mg total) by mouth daily.     metoprolol succinate (TOPROL-XL) 25 MG 24 hr tablet Take 1 tablet (25 mg total)  by mouth daily. 90 tablet 3   mirtazapine (REMERON) 45 MG tablet Take 45 mg by mouth at bedtime.     nitroGLYCERIN (NITROSTAT) 0.4 MG SL tablet DISSOLVE ONE TABLET UNDER TONGUE EVERY 5 MINUTES AS NEEDED UP TO 3 DOSES. IF NO RELIEF CALL 911. 25 tablet 5   omeprazole (PRILOSEC) 40 MG capsule Take 1 capsule by mouth daily.     polycarbophil (FIBERCON) 625 MG tablet Take 1 tablet (625 mg total) by mouth 2 (two) times daily. 60 tablet 0   rosuvastatin (CRESTOR) 40 MG tablet Take 1 tablet (40 mg total) by mouth at bedtime. 90 tablet 3   sertraline (ZOLOFT) 100 MG tablet Take 1 tablet by mouth daily.     SINGULAIR 10 MG tablet Take 10 mg by mouth daily.      sitaGLIPtin (JANUVIA) 100 MG tablet Take 100 mg by mouth daily.     valsartan (DIOVAN) 160 MG tablet Take 1 tablet (160 mg total) by mouth daily. 90 tablet 3   No current facility-administered medications for this visit.    Allergies  Allergen Reactions   Dextrans Nausea Only    Social History   Socioeconomic History   Marital status: Married    Spouse name: Not on file   Number of children: 2   Years of education: Not on file   Highest education level: Not on file  Occupational History    Employer: [employer]  Tobacco Use   Smoking status: Never   Smokeless tobacco: Never  Vaping Use   Vaping Use: Never used  Substance and Sexual Activity   Alcohol use: Yes    Alcohol/week: 1.0 standard drink of alcohol    Types: 1 drink(s) per week   Drug use: No   Sexual activity: Not on file  Other Topics Concern   Not on file  Social History Narrative   The lives in [place] ,[place] with his wife . He works at the post office. He denies tobacco use, drinks alcohol 2-3 times per week, and uses energy supplements occasionally   Social Determinants of Health   Financial Resource Strain: Not on file  Food Insecurity: Not on file  Transportation Needs: Not on file  Physical Activity: Not on file  Stress: Not on file   Social Connections: Not on file  Intimate Partner Violence: Not on file    Family History  Problem Relation Age of Onset   Heart attack Father        age 35   Coronary artery disease Brother        onset  CAD age 43   Review of Systems:    As stated in the HPI and otherwise negative.   BP 118/68 (BP Location: Right Arm, Patient Position: Sitting, Cuff Size: Normal)   Pulse 66   Ht 5' 5" (1.651 m)   Wt 67.3 kg   SpO2 97%   BMI 24.70 kg/m   Physical Examination:  General: Well developed, well nourished, NAD  HEENT: OP clear, mucus membranes moist  SKIN: warm, dry. No rashes. Neuro: No focal deficits  Musculoskeletal: Muscle strength 5/5 all ext  Psychiatric: Mood and affect normal  Neck: No JVD, no carotid bruits, no thyromegaly, no lymphadenopathy.  Lungs:Clear bilaterally, no wheezes, rhonci, crackles Cardiovascular: Regular rate and rhythm. No murmurs, gallops or rubs. Abdomen:Soft. Bowel sounds present. Non-tender.  Extremities: No lower extremity edema. Pulses are 2 + in the bilateral DP/PT.  EKG:  EKG is  ordered today. The ekg ordered today demonstrates NSR  Recent Labs: No results found for requested labs within last 365 days.   Lipid Panel    Component Value Date/Time   CHOL 141 11/17/2013 0255   TRIG 289 (H) 11/17/2013 0255   HDL 43 11/17/2013 0255   CHOLHDL 3.3 11/17/2013 0255   VLDL 58 (H) 11/17/2013 0255   LDLCALC 40 11/17/2013 0255     Wt Readings from Last 3 Encounters:  01/01/23 67.3 kg  09/22/22 68.9 kg  04/12/22 71.5 kg     Assessment and Plan:   1. CAD with unstable angina: Recent inferior STEMI in [place] in December 2023. Moderate to severe LAD stenosis was not treated. He is now having unstable angina. Will plan cardiac cath with possible PCI on 01/04/23 at 9am at [facility]. Will continue ASA, Plavix, statin , Zetia, Toprol and Diovan.    I have reviewed the risks, indications, and alternatives to cardiac catheterization, possible  angioplasty, and stenting with the patient. Risks include but are not limited to bleeding, infection, vascular injury, stroke, myocardial infection, arrhythmia, kidney injury, radiation-related injury in the case of prolonged fluoroscopy use, emergency cardiac surgery, and death. The patient understands the risks of serious complication is 1-2 in 1000 with diagnostic cardiac cath and 1-2% or less with angioplasty/stenting.    2. HTN: BP is controlled. No changes today  3. HLD: Lipids followed in primary care. LDL 66 in December 2023 (in [place]). Continue statin.   Labs/ tests ordered today include:   Orders Placed This Encounter  Procedures   Basic metabolic panel   CBC   EKG 12-Lead   Disposition:   F/U with me in 12  months  Signed, Kruz Chiu, MD 01/01/2023 3:39 PM     [facility] [address], [place], [place]  [address] Phone: [phone]; Fax: [phone]    .  

## 2023-01-02 LAB — CBC
Hematocrit: 33.2 % — ABNORMAL LOW (ref 37.5–51.0)
Hemoglobin: 10.5 g/dL — ABNORMAL LOW (ref 13.0–17.7)
MCH: 27.4 pg (ref 26.6–33.0)
MCHC: 31.6 g/dL (ref 31.5–35.7)
MCV: 87 fL (ref 79–97)
Platelets: 222 10*3/uL (ref 150–450)
RBC: 3.83 x10E6/uL — ABNORMAL LOW (ref 4.14–5.80)
RDW: 13.3 % (ref 11.6–15.4)
WBC: 4.6 10*3/uL (ref 3.4–10.8)

## 2023-01-02 LAB — BASIC METABOLIC PANEL
BUN/Creatinine Ratio: 19 (ref 10–24)
BUN: 18 mg/dL (ref 8–27)
CO2: 25 mmol/L (ref 20–29)
Calcium: 9.2 mg/dL (ref 8.6–10.2)
Chloride: 105 mmol/L (ref 96–106)
Creatinine, Ser: 0.94 mg/dL (ref 0.76–1.27)
Glucose: 82 mg/dL (ref 70–99)
Potassium: 4.5 mmol/L (ref 3.5–5.2)
Sodium: 146 mmol/L — ABNORMAL HIGH (ref 134–144)
eGFR: 93 mL/min/{1.73_m2} (ref >59)

## 2023-01-03 ENCOUNTER — Telehealth: Payer: Self-pay | Admitting: *Deleted

## 2023-01-03 NOTE — Telephone Encounter (Addendum)
Cardiac Catheterization scheduled at [facility] for: Thursday Jan 04, 2023 9 AM Arrival time [facility] Main Entrance A at: 7 AM  Nothing to eat after midnight prior to procedure, clear liquids until 5 AM day of procedure.  Medication instructions: -Hold:  Metformin-day of procedure and 48 hours post procedure Januvia-AM of procedure -Other usual morning medications can be taken with sips of water including aspirin 81 mg and Plavix 75 mg.   Confirmed patient has responsible adult to drive home post procedure and be with patient first 24 hours after arriving home.  Plan to go home the same day, you will only stay overnight if medically necessary.  Reviewed procedure instructions with patient.

## 2023-01-04 ENCOUNTER — Ambulatory Visit (HOSPITAL_COMMUNITY)
Admission: RE | Admit: 2023-01-04 | Discharge: 2023-01-04 | Disposition: A | Discharge status: Home / Self Care | Payer: Federal, State, Local not specified - PPO | Attending: Cardiovascular Disease | Admitting: Cardiovascular Disease

## 2023-01-04 ENCOUNTER — Ambulatory Visit (HOSPITAL_COMMUNITY)
Admission: RE | Disposition: A | Discharge status: Home / Self Care | Payer: Self-pay | Source: Home / Self Care | Attending: Cardiovascular Disease

## 2023-01-04 DIAGNOSIS — J45909 Unspecified asthma, uncomplicated: Secondary | ICD-10-CM | POA: Diagnosis not present

## 2023-01-04 DIAGNOSIS — Z79899 Other long term (current) drug therapy: Secondary | ICD-10-CM | POA: Insufficient documentation

## 2023-01-04 DIAGNOSIS — E785 Hyperlipidemia, unspecified: Secondary | ICD-10-CM | POA: Diagnosis not present

## 2023-01-04 DIAGNOSIS — I252 Old myocardial infarction: Secondary | ICD-10-CM | POA: Diagnosis not present

## 2023-01-04 DIAGNOSIS — I25110 Atherosclerotic heart disease of native coronary artery with unstable angina pectoris: Secondary | ICD-10-CM

## 2023-01-04 DIAGNOSIS — Z955 Presence of coronary angioplasty implant and graft: Secondary | ICD-10-CM | POA: Diagnosis not present

## 2023-01-04 DIAGNOSIS — Z006 Encounter for examination for normal comparison and control in clinical research program: Secondary | ICD-10-CM

## 2023-01-04 DIAGNOSIS — Z7902 Long term (current) use of antithrombotics/antiplatelets: Secondary | ICD-10-CM | POA: Insufficient documentation

## 2023-01-04 DIAGNOSIS — Z7982 Long term (current) use of aspirin: Secondary | ICD-10-CM | POA: Insufficient documentation

## 2023-01-04 DIAGNOSIS — I10 Essential (primary) hypertension: Secondary | ICD-10-CM | POA: Diagnosis not present

## 2023-01-04 HISTORY — PX: CORONARY STENT INTERVENTION: CATH118234

## 2023-01-04 HISTORY — PX: LEFT HEART CATH AND CORONARY ANGIOGRAPHY: CATH118249

## 2023-01-04 LAB — GLUCOSE, CAPILLARY
Glucose-Capillary: 145 mg/dL — ABNORMAL HIGH (ref 70–99)
Glucose-Capillary: 106 mg/dL — ABNORMAL HIGH (ref 70–99)

## 2023-01-04 SURGERY — LEFT HEART CATH AND CORONARY ANGIOGRAPHY
Anesthesia: LOCAL

## 2023-01-04 MED ORDER — IOHEXOL 350 MG/ML SOLN
INTRAVENOUS | Status: DC | PRN
  Administered 2023-01-04: 100 mL

## 2023-01-04 MED ORDER — SODIUM CHLORIDE 0.9% FLUSH: 3.0000 mL | INTRAVENOUS | Status: DC | PRN

## 2023-01-04 MED ORDER — VERAPAMIL HCL 2.5 MG/ML IV SOLN
INTRAVENOUS | Status: DC | PRN
  Administered 2023-01-04: 10 mL via INTRA_ARTERIAL

## 2023-01-04 MED ORDER — SODIUM CHLORIDE 0.9 % IV SOLN: 250.0000 mL | INTRAVENOUS | Status: DC | PRN

## 2023-01-04 MED ORDER — NITROGLYCERIN 1 MG/10 ML FOR IR/CATH LAB
INTRA_ARTERIAL | Status: AC
  Filled 2023-01-04: qty 10

## 2023-01-04 MED ORDER — PANTOPRAZOLE SODIUM 40 MG PO TBEC: 40.0000 mg | DELAYED_RELEASE_TABLET | Freq: Every day | ORAL | 3 refills | Status: DC

## 2023-01-04 MED ORDER — SODIUM CHLORIDE 0.9% FLUSH: 3.0000 mL | Freq: Two times a day (BID) | INTRAVENOUS | Status: DC

## 2023-01-04 MED ORDER — ASPIRIN 81 MG PO CHEW: 81.0000 mg | CHEWABLE_TABLET | ORAL | Status: DC

## 2023-01-04 MED ORDER — CLOPIDOGREL BISULFATE 75 MG PO TABS: 75.0000 mg | ORAL_TABLET | Freq: Every day | ORAL | Status: DC

## 2023-01-04 MED ORDER — VERAPAMIL HCL 2.5 MG/ML IV SOLN
INTRAVENOUS | Status: AC
  Filled 2023-01-04: qty 2

## 2023-01-04 MED ORDER — SODIUM CHLORIDE 0.9 % WEIGHT BASED INFUSION
3.0000 mL/kg/h | INTRAVENOUS | Status: DC
  Administered 2023-01-04: 3 mL/kg/h via INTRAVENOUS

## 2023-01-04 MED ORDER — LIDOCAINE HCL (PF) 1 % IJ SOLN
INTRAMUSCULAR | Status: AC
  Filled 2023-01-04: qty 30

## 2023-01-04 MED ORDER — SODIUM CHLORIDE 0.9 % WEIGHT BASED INFUSION: 1.0000 mL/kg/h | INTRAVENOUS | Status: DC

## 2023-01-04 MED ORDER — HYOSCYAMINE SULFATE ER 0.375 MG PO TB12: 0.3750 mg | ORAL_TABLET | Freq: Two times a day (BID) | ORAL | Status: DC

## 2023-01-04 MED ORDER — ALBUTEROL SULFATE HFA 108 (90 BASE) MCG/ACT IN AERS: 2.0000 | INHALATION_SPRAY | Freq: Four times a day (QID) | RESPIRATORY_TRACT | Status: DC | PRN

## 2023-01-04 MED ORDER — HEPARIN (PORCINE) IN NACL 1000-0.9 UT/500ML-% IV SOLN
INTRAVENOUS | Status: DC | PRN
  Administered 2023-01-04 (×2): 500 mL

## 2023-01-04 MED ORDER — IRBESARTAN 75 MG PO TABS: 75.0000 mg | ORAL_TABLET | Freq: Every day | ORAL | Status: DC

## 2023-01-04 MED ORDER — EZETIMIBE 10 MG PO TABS: 10.0000 mg | ORAL_TABLET | Freq: Every day | ORAL | Status: DC

## 2023-01-04 MED ORDER — METOPROLOL SUCCINATE ER 25 MG PO TB24: 25.0000 mg | ORAL_TABLET | Freq: Every day | ORAL | Status: DC

## 2023-01-04 MED ORDER — NITROGLYCERIN 0.4 MG SL SUBL: 0.4000 mg | SUBLINGUAL_TABLET | SUBLINGUAL | Status: DC | PRN

## 2023-01-04 MED ORDER — ACETAMINOPHEN 325 MG PO TABS: 650.0000 mg | ORAL_TABLET | ORAL | Status: DC | PRN

## 2023-01-04 MED ORDER — FENTANYL CITRATE (PF) 100 MCG/2ML IJ SOLN
INTRAMUSCULAR | Status: AC
  Filled 2023-01-04: qty 2

## 2023-01-04 MED ORDER — PANTOPRAZOLE SODIUM 40 MG PO TBEC: 40.0000 mg | DELAYED_RELEASE_TABLET | Freq: Every day | ORAL | Status: DC

## 2023-01-04 MED ORDER — HYDRALAZINE HCL 20 MG/ML IJ SOLN: 10.0000 mg | INTRAMUSCULAR | Status: DC | PRN

## 2023-01-04 MED ORDER — FENTANYL CITRATE (PF) 100 MCG/2ML IJ SOLN
INTRAMUSCULAR | Status: DC | PRN
  Administered 2023-01-04: 25 ug via INTRAVENOUS
  Administered 2023-01-04: 50 ug via INTRAVENOUS

## 2023-01-04 MED ORDER — ONDANSETRON HCL 4 MG/2ML IJ SOLN: 4.0000 mg | Freq: Four times a day (QID) | INTRAMUSCULAR | Status: DC | PRN

## 2023-01-04 MED ORDER — MIRTAZAPINE 45 MG PO TABS: 45.0000 mg | ORAL_TABLET | Freq: Every day | ORAL | Status: DC

## 2023-01-04 MED ORDER — SODIUM CHLORIDE 0.9 % IV SOLN: INTRAVENOUS | Status: DC

## 2023-01-04 MED ORDER — MIDAZOLAM HCL 2 MG/2ML IJ SOLN
INTRAMUSCULAR | Status: AC
  Filled 2023-01-04: qty 2

## 2023-01-04 MED ORDER — ROSUVASTATIN CALCIUM 40 MG PO TABS: 40.0000 mg | ORAL_TABLET | Freq: Every day | ORAL | Status: DC

## 2023-01-04 MED ORDER — FERROUS SULFATE 325 (65 FE) MG PO TABS: 325.0000 mg | ORAL_TABLET | Freq: Two times a day (BID) | ORAL | Status: DC

## 2023-01-04 MED ORDER — HEPARIN SODIUM (PORCINE) 1000 UNIT/ML IJ SOLN
INTRAMUSCULAR | Status: DC | PRN
  Administered 2023-01-04: 6500 [IU] via INTRAVENOUS
  Administered 2023-01-04: 3500 [IU] via INTRAVENOUS

## 2023-01-04 MED ORDER — MIDAZOLAM HCL 2 MG/2ML IJ SOLN
INTRAMUSCULAR | Status: DC | PRN
  Administered 2023-01-04: 2 mg via INTRAVENOUS
  Administered 2023-01-04: 1 mg via INTRAVENOUS

## 2023-01-04 MED ORDER — LIDOCAINE HCL (PF) 1 % IJ SOLN
INTRAMUSCULAR | Status: DC | PRN
  Administered 2023-01-04: 2 mL

## 2023-01-04 MED ORDER — ASPIRIN 81 MG PO TABS: 81.0000 mg | ORAL_TABLET | Freq: Every day | ORAL | Status: DC

## 2023-01-04 MED ORDER — OXCARBAZEPINE 150 MG PO TABS: 150.0000 mg | ORAL_TABLET | Freq: Two times a day (BID) | ORAL | Status: DC

## 2023-01-04 MED ORDER — MONTELUKAST SODIUM 10 MG PO TABS: 10.0000 mg | ORAL_TABLET | Freq: Every day | ORAL | Status: DC

## 2023-01-04 MED ORDER — METFORMIN HCL ER 500 MG PO TB24: 500.0000 mg | ORAL_TABLET | Freq: Every day | ORAL | Status: AC

## 2023-01-04 MED ORDER — LABETALOL HCL 5 MG/ML IV SOLN: 10.0000 mg | INTRAVENOUS | Status: DC | PRN

## 2023-01-04 MED ORDER — HEPARIN SODIUM (PORCINE) 1000 UNIT/ML IJ SOLN
INTRAMUSCULAR | Status: AC
  Filled 2023-01-04: qty 10

## 2023-01-04 SURGICAL SUPPLY — 19 items
BALL SAPPHIRE NC24 2.50X8 (BALLOONS) ×1
BALLN SAPPHIRE 2.0X12 (BALLOONS) ×1
BALLOON SAPPHIRE 2.0X12 (BALLOONS) IMPLANT
BALLOON SAPPHIRE NC24 2.50X8 (BALLOONS) IMPLANT
CATH INFINITI 5FR MULTPACK ANG (CATHETERS) IMPLANT
CATH VISTA GUIDE 6FR XBLAD3.5 (CATHETERS) IMPLANT
DEVICE RAD COMP TR BAND LRG (VASCULAR PRODUCTS) IMPLANT
ELECT DEFIB PAD ADLT CADENCE (PAD) IMPLANT
GLIDESHEATH SLEND SS 6F .021 (SHEATH) IMPLANT
GUIDEWIRE INQWIRE 1.5J.035X260 (WIRE) IMPLANT
INQWIRE 1.5J .035X260CM (WIRE) ×1
KIT ENCORE 26 ADVANTAGE (KITS) IMPLANT
KIT HEART LEFT (KITS) ×1 IMPLANT
PACK CARDIAC CATHETERIZATION (CUSTOM PROCEDURE TRAY) ×1 IMPLANT
STENT ONYX FRONTIER 2.25X12 (Permanent Stent) IMPLANT
TRANSDUCER W/STOPCOCK (MISCELLANEOUS) ×1 IMPLANT
TUBING CIL FLEX 10 FLL-RA (TUBING) ×1 IMPLANT
WIRE COUGAR XT STRL 190CM (WIRE) IMPLANT
WIRE RUNTHROUGH .014X180CM (WIRE) IMPLANT

## 2023-01-04 NOTE — Progress Notes (Signed)
Pt was educated on stent card, stent location, Plavix and ASA use, wt restrictions, no baths/daily wash-ups, s/s of infection, ex guidelines (progressive walking), s/s to stop exercising, NTG use and calling 911, heart healthy diet,  and CRPII. Pt received materials on exercise and diet. Will  refer to [facility].

## 2023-01-04 NOTE — Interval H&P Note (Signed)
History and Physical Interval Note:  01/04/2023 7:24 AM  Alexander Calderon  has presented today for surgery, with the diagnosis of unstable angina.  The various methods of treatment have been discussed with the patient and family. After consideration of risks, benefits and other options for treatment, the patient has consented to  Procedure(s): LEFT HEART CATH AND CORONARY ANGIOGRAPHY (N/A) as a surgical intervention.  The patient's history has been reviewed, patient examined, no change in status, stable for surgery.  I have reviewed the patient's chart and labs.  Questions were answered to the patient's satisfaction.    Cath Lab Visit (complete for each Cath Lab visit)  Clinical Evaluation Leading to the Procedure:   ACS: No.  Non-ACS:    Anginal Classification: CCS III  Anti-ischemic medical therapy: Minimal Therapy (1 class of medications)  Non-Invasive Test Results: No non-invasive testing performed  Prior CABG: No previous CABG        Verne Carrow

## 2023-01-04 NOTE — Discharge Summary (Signed)
Discharge Summary for Same Day PCI   Patient ID: Alexander Calderon MRN: [id number]; DOB: 1961/07/26  Admit date: 01/04/2023 Discharge date: 01/04/2023  Primary Care Provider: Loyal Jacobson, MD  Primary Cardiologist: Verne Carrow, MD  Primary Electrophysiologist:  None   Discharge Diagnoses    Active Problems:   * No active hospital problems. *    Diagnostic Studies/Procedures    Cardiac Catheterization 01/04/2023:     Prox RCA lesion is 50% stenosed.   Prox RCA to Mid RCA lesion is 30% stenosed.   Mid RCA to Dist RCA lesion is 30% stenosed.   Prox Cx to Mid Cx lesion is 90% stenosed.   Mid LAD to Dist LAD lesion is 50% stenosed.   Dist LAD lesion is 50% stenosed.   Prox LAD to Mid LAD lesion is 50% stenosed.   2nd Diag lesion is 60% stenosed.   Previously placed RPDA stent of unknown type is  widely patent.   A drug-eluting stent was successfully placed using a STENT ONYX FRONTIER 2.25X12.   Post intervention, there is a 0% residual stenosis.   The left ventricular systolic function is normal.   LV end diastolic pressure is normal.   The left ventricular ejection fraction is 50-55% by visual estimate.   The LAD is a moderate caliber vessel that does not reach the apex. There is diffuse moderate non-obstructive disease in the proximal, mid and distal LAD with no focal targets for PCI. There is a small caliber diagonal branch with moderate ostial stenosis.  The Circumflex has a severe mid stenosis.  The RCA has patent proximal stented segment and patent stented segment in the PDA Successful PTCA/DES x 1 mid Circumflex   Recommendations: Continue DAPT with ASA and Plavix. Continue current home medications. Same day discharge post PCI.    Diagnostic Dominance: Right  Intervention   _____________   History of Present Illness     Alexander Calderon is a 62 y.o. male with PMH CAD, HTN and asthma. Patient was seen by Dr. Clifton James in clinic on 4/29 and reported worsening  pain in epigastric region that radiated to his chest, relieved with NTG and worsened by exertion. Given recent inferior STEMI in [place] in December 2023 and moderate to severe LAD stenosis at that time not treated, decision made to arrange cardiac catheterization for further evaluation.  Hospital Course     The patient underwent cardiac cath/PCI as noted above with Dr. Clifton James. Will continue DAPT with ASA/Brililnta. The patient was seen by cardiac rehab while in short stay. There were no observed complications post cath. Radial cath site was re-evaluated prior to discharge and found to be stable without any complications. Instructions/precautions regarding cath site care were given prior to discharge.  Alexander Calderon was seen by Dr. Clifton James and determined stable for discharge home. Follow up with our office has been arranged. Medications are listed below. Pertinent changes include switching Omeprazole to Pantoprazole due to P2Y12 interaction.    _____________  Cath/PCI Registry Performance & Quality Measures: Aspirin prescribed? - Yes ADP Receptor Inhibitor (Plavix/Clopidogrel, Brilinta/Ticagrelor or Effient/Prasugrel) prescribed (includes medically managed patients)? - Yes High Intensity Statin (Lipitor 40-80mg  or Crestor 20-40mg ) prescribed? - Yes For EF <40%, was ACEI/ARB prescribed? - Not Applicable (EF >/= 40%) For EF <40%, Aldosterone Antagonist (Spironolactone or Eplerenone) prescribed? - Not Applicable (EF >/= 40%) Cardiac Rehab Phase II ordered (Included Medically managed Patients)? - Yes  _____________   Discharge Vitals Blood pressure 111/62, pulse 68, temperature (!) 97.4 F (36.3  C), temperature source Oral, resp. rate 15, height 5\' 5"  (1.651 m), weight 68 kg, SpO2 98 %.  Filed Weights   01/04/23 0714  Weight: 68 kg   Physical Exam Constitutional:      Appearance: Normal appearance.  HENT:     Head: Normocephalic.  Cardiovascular:     Rate and Rhythm: Normal rate and  regular rhythm.     Pulses: Normal pulses.     Heart sounds: Normal heart sounds.  Pulmonary:     Effort: Pulmonary effort is normal.     Breath sounds: Normal breath sounds.  Musculoskeletal:        General: No swelling.  Skin:    General: Skin is warm and dry.     Capillary Refill: Capillary refill takes less than 2 seconds.     Comments: Left radial access site with TR band in place. No active bleeding, swelling, hematoma. Strong distal radial pulse.  Neurological:     General: No focal deficit present.     Mental Status: He is alert and oriented to person, place, and time.  Psychiatric:        Mood and Affect: Mood normal.        Behavior: Behavior normal.        Thought Content: Thought content normal.        Judgment: Judgment normal.      Last Labs & Radiologic Studies    CBC Recent Labs    01/01/23 1537  WBC 4.6  HGB 10.5*  HCT 33.2*  MCV 87  PLT 222   Basic Metabolic Panel Recent Labs    16/10/96 1537  NA 146*  K 4.5  CL 105  CO2 25  GLUCOSE 82  BUN 18  CREATININE 0.94  CALCIUM 9.2   Liver Function Tests No results for input(s): "AST", "ALT", "ALKPHOS", "BILITOT", "PROT", "ALBUMIN" in the last 72 hours. No results for input(s): "LIPASE", "AMYLASE" in the last 72 hours. High Sensitivity Troponin:   No results for input(s): "TROPONINIHS" in the last 720 hours.  BNP Invalid input(s): "POCBNP" D-Dimer No results for input(s): "DDIMER" in the last 72 hours. Hemoglobin A1C No results for input(s): "HGBA1C" in the last 72 hours. Fasting Lipid Panel No results for input(s): "CHOL", "HDL", "LDLCALC", "TRIG", "CHOLHDL", "LDLDIRECT" in the last 72 hours. Thyroid Function Tests No results for input(s): "TSH", "T4TOTAL", "T3FREE", "THYROIDAB" in the last 72 hours.  Invalid input(s): "FREET3" _____________  CARDIAC CATHETERIZATION  Result Date: 01/04/2023   Prox RCA lesion is 50% stenosed.   Prox RCA to Mid RCA lesion is 30% stenosed.   Mid RCA to Dist  RCA lesion is 30% stenosed.   Prox Cx to Mid Cx lesion is 90% stenosed.   Mid LAD to Dist LAD lesion is 50% stenosed.   Dist LAD lesion is 50% stenosed.   Prox LAD to Mid LAD lesion is 50% stenosed.   2nd Diag lesion is 60% stenosed.   Previously placed RPDA stent of unknown type is  widely patent.   A drug-eluting stent was successfully placed using a STENT ONYX FRONTIER 2.25X12.   Post intervention, there is a 0% residual stenosis.   The left ventricular systolic function is normal.   LV end diastolic pressure is normal.   The left ventricular ejection fraction is 50-55% by visual estimate. The LAD is a moderate caliber vessel that does not reach the apex. There is diffuse moderate non-obstructive disease in the proximal, mid and distal LAD with no focal  targets for PCI. There is a small caliber diagonal branch with moderate ostial stenosis. The Circumflex has a severe mid stenosis. The RCA has patent proximal stented segment and patent stented segment in the PDA Successful PTCA/DES x 1 mid Circumflex Recommendations: Continue DAPT with ASA and Plavix. Continue current home medications. Same day discharge post PCI.    Disposition   Pt is being discharged home today in good condition.  Follow-up Plans & Appointments     Discharge Instructions     Amb Referral to Cardiac Rehabilitation   Complete by: As directed    Diagnosis: Coronary Stents   After initial evaluation and assessments completed: Virtual Based Care may be provided alone or in conjunction with Phase 2 Cardiac Rehab based on patient barriers.: Yes   Intensive Cardiac Rehabilitation (ICR) [facility] location only OR Traditional Cardiac Rehabilitation (TCR) *If criteria for ICR are not met will enroll in TCR [facility] only): Yes   Diet - low sodium heart healthy   Complete by: As directed    Discharge instructions   Complete by: As directed    Please do not take Metformin until 5/5 (can cause abnormal kidney function if taken immediately after  catheterization).   Increase activity slowly   Complete by: As directed         Discharge Medications   Allergies as of 01/04/2023       Reactions   Dextromethorphan Nausea And Vomiting        Medication List     STOP taking these medications    omeprazole 40 MG capsule Commonly known as: PRILOSEC Replaced by: pantoprazole 40 MG tablet       TAKE these medications    albuterol 108 (90 Base) MCG/ACT inhaler Commonly known as: VENTOLIN HFA Inhale 2 puffs into the lungs every 6 (six) hours as needed for wheezing.   aspirin 81 MG tablet Take 81 mg by mouth daily.   azelastine 0.1 % nasal spray Commonly known as: ASTELIN Place 1 spray into both nostrils daily as needed for rhinitis. Use in each nostril as directed   cetirizine 10 MG tablet Commonly known as: ZYRTEC Take 10 mg by mouth daily as needed for allergies.   clopidogrel 75 MG tablet Commonly known as: PLAVIX Take 1 tablet (75 mg total) by mouth daily.   ezetimibe 10 MG tablet Commonly known as: ZETIA Take 1 tablet (10 mg total) by mouth at bedtime.   ferrous sulfate 325 (65 FE) MG tablet Take 325 mg by mouth 2 (two) times daily.   fluticasone 50 MCG/ACT nasal spray Commonly known as: FLONASE Place 1 spray into both nostrils as needed for allergies.   HYDROcodone-acetaminophen 5-325 MG tablet Commonly known as: NORCO/VICODIN Take 1 tablet by mouth 3 (three) times daily as needed (cough).   hyoscyamine 0.375 MG 12 hr tablet Commonly known as: LEVBID Take 0.375 mg by mouth 2 (two) times daily.   metFORMIN 500 MG 24 hr tablet Commonly known as: GLUCOPHAGE-XR Take 1 tablet (500 mg total) by mouth daily. Take 1 tablet (500 mg total) by mouth daily. Do not resume taking until 01/07/23. What changed: additional instructions   metoprolol succinate 25 MG 24 hr tablet Commonly known as: TOPROL-XL Take 1 tablet (25 mg total) by mouth daily.   mirtazapine 45 MG tablet Commonly known as: REMERON Take  45 mg by mouth at bedtime.   nitroGLYCERIN 0.4 MG SL tablet Commonly known as: NITROSTAT DISSOLVE ONE TABLET UNDER TONGUE EVERY 5 MINUTES AS NEEDED UP  TO 3 DOSES. IF NO RELIEF CALL 911.   OXcarbazepine 150 MG tablet Commonly known as: TRILEPTAL Take 150 mg by mouth 2 (two) times daily.   pantoprazole 40 MG tablet Commonly known as: PROTONIX Take 1 tablet (40 mg total) by mouth daily. Start taking on: Jan 05, 2023 Replaces: omeprazole 40 MG capsule   rosuvastatin 40 MG tablet Commonly known as: CRESTOR Take 1 tablet (40 mg total) by mouth at bedtime.   sildenafil 100 MG tablet Commonly known as: VIAGRA Take 100 mg by mouth daily as needed for erectile dysfunction.   Singulair 10 MG tablet Generic drug: montelukast Take 10 mg by mouth at bedtime.   sitaGLIPtin 100 MG tablet Commonly known as: JANUVIA Take 100 mg by mouth daily.   Testosterone 1.62 % Gel Place 2 Pump onto the skin daily.   valsartan 160 MG tablet Commonly known as: DIOVAN Take 1 tablet (160 mg total) by mouth daily.           Allergies Allergies  Allergen Reactions   Dextromethorphan Nausea And Vomiting    Outstanding Labs/Studies     Duration of Discharge Encounter   Greater than 30 minutes including physician time.  Con Memos, PA-C 01/04/2023, 12:59 PM

## 2023-01-04 NOTE — Research (Signed)
Selution Informed Consent   Subject Name: Alexander Calderon  Subject met inclusion and exclusion criteria.  The informed consent form, study requirements and expectations were reviewed with the subject and questions and concerns were addressed prior to the signing of the consent form.  The subject verbalized understanding of the trial requirements.  The subject agreed to participate in the Selution trial and signed the informed consent on 01/04/2023.  The informed consent was obtained prior to performance of any protocol-specific procedures for the subject.  A copy of the signed informed consent was given to the subject and a copy was placed in the subject's medical record.   Giavonna Pflum    Screen fail

## 2023-01-05 ENCOUNTER — Encounter (HOSPITAL_COMMUNITY): Payer: Self-pay | Admitting: Cardiovascular Disease

## 2023-01-05 LAB — POCT ACTIVATED CLOTTING TIME: Activated Clotting Time: 325 seconds

## 2023-01-05 LAB — CK TOTAL AND CKMB (NOT AT ARMC)
CK-MB Index: 2.3 ng/mL (ref 0.0–10.4)
Total CK: 85 U/L (ref 41–331)

## 2023-01-05 MED FILL — Nitroglycerin IV Soln 100 MCG/ML in D5W: INTRA_ARTERIAL | Qty: 10 | Status: AC

## 2023-01-08 ENCOUNTER — Telehealth (HOSPITAL_COMMUNITY): Payer: Self-pay

## 2023-01-08 NOTE — Telephone Encounter (Signed)
Per Phase I Cardiac Rehab referral faxed to [facility].

## 2023-01-24 NOTE — Progress Notes (Unsigned)
Office Visit    Patient Name: Alexander Calderon Date of Encounter: 01/24/2023  PCP:  Loyal Jacobson, MD   [facility]  Cardiologist:  Verne Carrow, MD  Advanced Practice Provider:  No care team member to display Electrophysiologist:  None   HPI    Alexander Calderon is a 62 y.o. male with a hx of CAD, HTN and asthma presents today for follow-up visit.  He was last seen via video visit 09/20/2020.  His CAD dates back to 2000 when he had a bare-metal stent placed in the RCA.  Cardiac catheterization 2002 with another bare-metal stent placed in RCA.  He was admitted with unstable angina 11/16/2013 at [facility] and cardiac catheterization 11/17/2013 showed severe distal RCA stenosis which was treated with a DES.  Echo 2014 with LVEF 65 to 70% with no valve disease.  When he was last seen he did not have any chest pain, dyspnea, palpitations, dizziness, near-syncope or syncope.  No lower extremity edema.  He was last seen by myself 03/21/2022 and at that time he stated  that he had a pain in his stomach and then moves up into his chest.  It was happening twice a month but has not happened in the past couple of months.  He stated that he takes a nitro and his pain is relieved.  He states does not happen after eating.  He does not have any associated shortness of breath.  With his extensive history of CAD and 3 stents placed in the past we discussed stress testing at that time.  His father actually had a heart attack at age 70 and passed away.  His first heart attack was at age 71.  His primary care gets a lipid panel every 6 months.  His last one was done in April which showed LDL 59, HDL 50, total cholesterol 409, triglycerides 103.  He has had no issues with his medications.  He has asked when he can stop his Plavix.  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  The patient was seen by me 04/12/22. The patient presents today to review his  Lexiscan Myoview results.  He states he had 1 more episode either right before or right after his last appointment with [place] that were similar to his previous episodes of pain which start in his stomach and work his way to his chest.  He denied any shortness of breath, lightheadedness, syncope, presyncope.  Otherwise, doing well from a cardiac perspective.  He was seen by Dr. Clifton James 01/01/2023 and at that time was having epigastric area pain that radiated to his chest.  Relieved with nitroglycerin.  Occured with exertion.  Plan for cardiac catheterization with possible PCI 01/04/2023.  Successful PTCA/DES x 1 to mid circumflex.  Plan for ASA and Plavix for a year.  Today, he tells me he had to take nitro on two separate occasions after his stent. Both times pain started in his stomach and worked its way up to his chest, atypical. He tells me nitro helped. Labs reviewed and stable, LDL 54, HDL 43, triglycerides 118. He does a lot of walking for work but no organized exercise. We discussed rehab and he is interested in participating.  Reports no shortness of breath nor dyspnea on exertion.  No edema, orthopnea, PND. Reports no palpitations.   Past Medical History    Past Medical History:  Diagnosis Date   Asthma  Coronary artery disease    a. history of BMS to RCA in 2000 and again in 2002. b. PTCA of the distal LCx in 2011. c. [place] 11/2013: s/p DES to distal RCA, normal LV function (done from L radial approach as pt has history of  difficulty engaging the LCA and significant radial spasm from R radial approach).   Depression    GERD (gastroesophageal reflux disease)    Hyperlipidemia    Hypertension    Type 2 diabetes mellitus (HCC)    Past Surgical History:  Procedure Laterality Date   APPENDECTOMY     CHOLECYSTECTOMY N/A 11/11/2020   Procedure: LAPAROSCOPIC CHOLECYSTECTOMY WITH  INTRAOPERATIVE CHOLANGIOGRAM;  Surgeon: Luretha Murphy, MD;  Location: [facility] ORS;  Service: General;  Laterality: N/A;    CORONARY ANGIOPLASTY WITH STENT PLACEMENT     CORONARY STENT INTERVENTION N/A 01/04/2023   Procedure: CORONARY STENT INTERVENTION;  Surgeon: Kathleene Hazel, MD;  Location: [facility] INVASIVE CV LAB;  Service: Cardiovascular;  Laterality: N/A;   LEFT HEART CATH AND CORONARY ANGIOGRAPHY N/A 01/04/2023   Procedure: LEFT HEART CATH AND CORONARY ANGIOGRAPHY;  Surgeon: Kathleene Hazel, MD;  Location: [facility] INVASIVE CV LAB;  Service: Cardiovascular;  Laterality: N/A;   LEFT HEART CATHETERIZATION WITH CORONARY ANGIOGRAM N/A 11/17/2013   Procedure: LEFT HEART CATHETERIZATION WITH CORONARY ANGIOGRAM;  Surgeon: Peter M [place], MD;  Location: [facility] CATH LAB;  Service: Cardiovascular;  Laterality: N/A;    Allergies  Allergies  Allergen Reactions   Dextromethorphan Nausea And Vomiting     EKGs/Labs/Other Studies Reviewed:   The following studies were reviewed today:  Cardiac cath 01/04/23  Left Anterior Descending  Vessel is moderate in size.  Prox LAD to Mid LAD lesion is 50% stenosed.  Mid LAD to Dist LAD lesion is 50% stenosed.  Dist LAD lesion is 50% stenosed.    Second Diagonal Branch  Vessel is moderate in size.  2nd Diag lesion is 60% stenosed.    Left Circumflex  Vessel is large.  Prox Cx to Mid Cx lesion is 90% stenosed.    First Obtuse Marginal Branch  Vessel is small in size.    Second Obtuse Marginal Branch  Vessel is moderate in size.    Third Obtuse Marginal Branch  Vessel is small in size.    Right Coronary Artery  Vessel is large.  Prox RCA lesion is 50% stenosed.  Prox RCA to Mid RCA lesion is 30% stenosed. The lesion was previously treated using a drug eluting stent between 1-5 months ago.  Mid RCA to Dist RCA lesion is 30% stenosed.    Right Posterior Descending Artery  Previously placed RPDA stent of unknown type is widely patent.    Intervention   Prox Cx to Mid Cx lesion  Stent  [place] GUIDE 6FR XBLAD3.5 guide catheter was inserted. Lesion  crossed with guidewire using a WIRE COUGAR XT STRL 190CM. Pre-stent angioplasty was performed using a BALLN SAPPHIRE 2.0X12. A drug-eluting stent was successfully placed using a STENT ONYX FRONTIER 2.25X12. Stent strut is well apposed. Post-stent angioplasty was performed using a BALL SAPPHIRE NC24 2.50X8.  Post-Intervention Lesion Assessment  The intervention was successful. Pre-interventional TIMI flow is 3. Post-intervention TIMI flow is 3. No complications occurred at this lesion.  There is a 0% residual stenosis post intervention.     Left Heart  Left Ventricle The left ventricular size is normal. The left ventricular systolic function is normal. LV end diastolic pressure is normal. The left ventricular ejection fraction  is 50-55% by visual estimate. No regional wall motion abnormalities.   Coronary Diagrams  Diagnostic Dominance: Right  Intervention    Implants    Lexiscan Myoview 04/05/2022    The study is normal. The study is low risk.   No ST deviation was noted. There were no arrhythmias during recovery.   LV perfusion is normal. There is no evidence of ischemia. There is no evidence of infarction.   Left ventricular function is normal. Nuclear stress EF: 66 %. The left ventricular ejection fraction is hyperdynamic (>65%). End diastolic cavity size is normal. End systolic cavity size is normal.   Prior study not available for comparison.  Echocardiogram: 06/2013 Left ventricle: The cavity size was normal. Wall thickness    was increased in a pattern of mild LVH. Systolic function    was vigorous. The estimated ejection fraction was in the    range of 65% to 70%. Wall motion was normal; there were no    regional wall motion abnormalities.  - Atrial septum: No defect or patent foramen ovale was    identified.   EKG:  EKG is not ordered today.   Recent Labs: 01/01/2023: BUN 18; Creatinine, Ser 0.94; Hemoglobin 10.5; Platelets 222; Potassium 4.5; Sodium 146  Recent Lipid  Panel    Component Value Date/Time   CHOL 141 11/17/2013 0255   TRIG 289 (H) 11/17/2013 0255   HDL 43 11/17/2013 0255   CHOLHDL 3.3 11/17/2013 0255   VLDL 58 (H) 11/17/2013 0255   LDLCALC 40 11/17/2013 0255    Home Medications   No outpatient medications have been marked as taking for the 01/25/23 encounter (Appointment) with Sharlene Dory, PA-C.     Review of Systems      All other systems reviewed and are otherwise negative except as noted above.  Physical Exam    VS:  There were no vitals taken for this visit. , BMI There is no height or weight on file to calculate BMI.  Wt Readings from Last 3 Encounters:  01/04/23 150 lb (68 kg)  01/01/23 148 lb 6.4 oz (67.3 kg)  09/22/22 152 lb (68.9 kg)     GEN: Well nourished, well developed, in no acute distress. HEENT: normal. Neck: Supple, no JVD, carotid bruits, or masses. Cardiac: RRR, no murmurs, rubs, or gallops. No clubbing, cyanosis, edema.  Radials/PT 2+ and equal bilaterally.  Respiratory:  Respirations regular and unlabored, clear to auscultation bilaterally. GI: Soft, nontender, nondistended. MS: No deformity or atrophy. Skin: Warm and dry, no rash. Neuro:  Strength and sensation are intact. Psych: Normal affect. Left arterial site is healing well, tiny hematoma.  Assessment & Plan    CAD with atypical chest pain -Status post bare-metal stent placed in the RCA in 2000 and 2002.  DES to distal RCA in 2015.  Remains on dual antiplatelet therapy with Plavix/aspirin  -cardiac cath with successful PTCA/DES x 1 to mid circumflex (01/04/23) -Lexiscan Myoview showed no ST deviation.  Normal low risk study.  No evidence of ischemia or infarction.  EF 66% -Continue GDMT: ASA 81 mg, Plavix 75 mg, Zetia 10 mg, Crestor 40 mg, Diovan 160 mg, nitroglycerin tabs as needed -he has needed nitro x 2 since stenting   Hypertension -Well-controlled today in the clinic -Continue current medication regimen -Continue to monitor your  blood pressure at home  Hyperlipidemia -Recent lipid panel with LDL at goal -Continue Crestor 40 mg daily and Zetia 10 mg daily -Discussed dietary modifications and improvements -Lipid panel  every 6 months per primary provider     Disposition: Follow up 3-4 months with Verne Carrow, MD or APP.  Signed, Sharlene Dory, PA-C 01/24/2023, 9:11 PM [facility]

## 2023-01-25 ENCOUNTER — Ambulatory Visit: Payer: Federal, State, Local not specified - PPO | Attending: Physician Assistant | Admitting: Physician Assistant

## 2023-01-25 ENCOUNTER — Encounter: Payer: Self-pay | Admitting: Physician Assistant

## 2023-01-25 VITALS — BP 112/64 | HR 66 | Ht 65.0 in | Wt 144.6 lb

## 2023-01-25 DIAGNOSIS — E785 Hyperlipidemia, unspecified: Secondary | ICD-10-CM

## 2023-01-25 DIAGNOSIS — I10 Essential (primary) hypertension: Secondary | ICD-10-CM

## 2023-01-25 DIAGNOSIS — I2510 Atherosclerotic heart disease of native coronary artery without angina pectoris: Secondary | ICD-10-CM | POA: Diagnosis not present

## 2023-01-25 NOTE — Patient Instructions (Signed)
Medication Instructions:  Your physician recommends that you continue on your current medications as directed. Please refer to the Current Medication list given to you today.  *If you need a refill on your cardiac medications before your next appointment, please call your pharmacy*  Lab Work: None ordered If you have labs (blood work) drawn today and your tests are completely normal, you will receive your results only by: MyChart Message (if you have MyChart) OR A paper copy in the mail If you have any lab test that is abnormal or we need to change your treatment, we will call you to review the results.  Follow-Up: At [facility], you and your health needs are our priority.  As part of our continuing mission to provide you with exceptional heart care, we have created designated Provider Care Teams.  These Care Teams include your primary Cardiologist (physician) and Advanced Practice Providers (APPs -  Physician Assistants and Nurse Practitioners) who all work together to provide you with the care you need, when you need it.  Your next appointment:   3-4 month(s) or first available therafter  Provider:   Verne Carrow, MD  Other Instructions We have placed another referral for cardiac rehab as discussed.  Heart-Healthy Eating Plan Many factors influence your heart health, including eating and exercise habits. Heart health is also called coronary health. Coronary risk increases with abnormal blood fat (lipid) levels. A heart-healthy eating plan includes limiting unhealthy fats, increasing healthy fats, limiting salt (sodium) intake, and making other diet and lifestyle changes. What is my plan? Your health care provider may recommend that: You limit your fat intake to _________% or less of your total calories each day. You limit your saturated fat intake to _________% or less of your total calories each day. You limit the amount of cholesterol in your diet to less than  _________ mg per day. You limit the amount of sodium in your diet to less than _________ mg per day. What are tips for following this plan? Cooking Cook foods using methods other than frying. Baking, boiling, grilling, and broiling are all good options. Other ways to reduce fat include: Removing the skin from poultry. Removing all visible fats from meats. Steaming vegetables in water or broth. Meal planning  At meals, imagine dividing your plate into fourths: Fill one-half of your plate with vegetables and green salads. Fill one-fourth of your plate with whole grains. Fill one-fourth of your plate with lean protein foods. Eat 2-4 cups of vegetables per day. One cup of vegetables equals 1 cup (91 g) broccoli or cauliflower florets, 2 medium carrots, 1 large bell pepper, 1 large sweet potato, 1 large tomato, 1 medium white potato, 2 cups (150 g) raw leafy greens. Eat 1-2 cups of fruit per day. One cup of fruit equals 1 small apple, 1 large banana, 1 cup (237 g) mixed fruit, 1 large orange,  cup (82 g) dried fruit, 1 cup (240 mL) 100% fruit juice. Eat more foods that contain soluble fiber. Examples include apples, broccoli, carrots, beans, peas, and barley. Aim to get 25-30 g of fiber per day. Increase your consumption of legumes, nuts, and seeds to 4-5 servings per week. One serving of dried beans or legumes equals  cup (90 g) cooked, 1 serving of nuts is  oz (12 almonds, 24 pistachios, or 7 walnut halves), and 1 serving of seeds equals  oz (8 g). Fats Choose healthy fats more often. Choose monounsaturated and polyunsaturated fats, such as olive and  canola oils, avocado oil, flaxseeds, walnuts, almonds, and seeds. Eat more omega-3 fats. Choose salmon, mackerel, sardines, tuna, flaxseed oil, and ground flaxseeds. Aim to eat fish at least 2 times each week. Check food labels carefully to identify foods with trans fats or high amounts of saturated fat. Limit saturated fats. These are found in  animal products, such as meats, butter, and cream. Plant sources of saturated fats include palm oil, palm kernel oil, and coconut oil. Avoid foods with partially hydrogenated oils in them. These contain trans fats. Examples are stick margarine, some tub margarines, cookies, crackers, and other baked goods. Avoid fried foods. General information Eat more home-cooked food and less restaurant, buffet, and fast food. Limit or avoid alcohol. Limit foods that are high in added sugar and simple starches such as foods made using white refined flour (white breads, pastries, sweets). Lose weight if you are overweight. Losing just 5-10% of your body weight can help your overall health and prevent diseases such as diabetes and heart disease. Monitor your sodium intake, especially if you have high blood pressure. Talk with your health care provider about your sodium intake. Try to incorporate more vegetarian meals weekly. What foods should I eat? Fruits All fresh, canned (in natural juice), or frozen fruits. Vegetables Fresh or frozen vegetables (raw, steamed, roasted, or grilled). Green salads. Grains Most grains. Choose whole wheat and whole grains most of the time. Rice and pasta, including brown rice and pastas made with whole wheat. Meats and other proteins Lean, well-trimmed beef, veal, pork, and lamb. Chicken and [place] without skin. All fish and shellfish. Wild duck, rabbit, pheasant, and venison. Egg whites or low-cholesterol egg substitutes. Dried beans, peas, lentils, and tofu. Seeds and most nuts. Dairy Low-fat or nonfat cheeses, including ricotta and mozzarella. Skim or 1% milk (liquid, powdered, or evaporated). Buttermilk made with low-fat milk. Nonfat or low-fat yogurt. Fats and oils Non-hydrogenated (trans-free) margarines. Vegetable oils, including soybean, sesame, sunflower, olive, avocado, peanut, safflower, corn, canola, and cottonseed. Salad dressings or mayonnaise made with a vegetable  oil. Beverages Water (mineral or sparkling). Coffee and tea. Unsweetened ice tea. Diet beverages. Sweets and desserts Sherbet, gelatin, and fruit ice. Small amounts of dark chocolate. Limit all sweets and desserts. Seasonings and condiments All seasonings and condiments. The items listed above may not be a complete list of foods and beverages you can eat. Contact a dietitian for more options. What foods should I avoid? Fruits Canned fruit in heavy syrup. Fruit in cream or butter sauce. Fried fruit. Limit coconut. Vegetables Vegetables cooked in cheese, cream, or butter sauce. Fried vegetables. Grains Breads made with saturated or trans fats, oils, or whole milk. Croissants. Sweet rolls. Donuts. High-fat crackers, such as cheese crackers and chips. Meats and other proteins Fatty meats, such as hot dogs, ribs, sausage, bacon, rib-eye roast or steak. High-fat deli meats, such as salami and bologna. Caviar. Domestic duck and goose. Organ meats, such as liver. Dairy Cream, sour cream, cream cheese, and creamed cottage cheese. Whole-milk cheeses. Whole or 2% milk (liquid, evaporated, or condensed). Whole buttermilk. Cream sauce or high-fat cheese sauce. Whole-milk yogurt. Fats and oils Meat fat, or shortening. Cocoa butter, hydrogenated oils, palm oil, coconut oil, palm kernel oil. Solid fats and shortenings, including bacon fat, salt pork, lard, and butter. Nondairy cream substitutes. Salad dressings with cheese or sour cream. Beverages Regular sodas and any drinks with added sugar. Sweets and desserts Frosting. Pudding. Cookies. Cakes. Pies. Milk chocolate or white chocolate. Buttered syrups. Full-fat ice  cream or ice cream drinks. The items listed above may not be a complete list of foods and beverages to avoid. Contact a dietitian for more information. Summary Heart-healthy meal planning includes limiting unhealthy fats, increasing healthy fats, limiting salt (sodium) intake and making  other diet and lifestyle changes. Lose weight if you are overweight. Losing just 5-10% of your body weight can help your overall health and prevent diseases such as diabetes and heart disease. Focus on eating a balance of foods, including fruits and vegetables, low-fat or nonfat dairy, lean protein, nuts and legumes, whole grains, and heart-healthy oils and fats. This information is not intended to replace advice given to you by your health care provider. Make sure you discuss any questions you have with your health care provider. Document Revised: 09/26/2021 Document Reviewed: 09/26/2021 Elsevier Patient Education  2023 [employer]  Low-Sodium Eating Plan Sodium, which is an element that makes up salt, helps you maintain a healthy balance of fluids in your body. Too much sodium can increase your blood pressure and cause fluid and waste to be held in your body. Your health care provider or dietitian may recommend following this plan if you have high blood pressure (hypertension), kidney disease, liver disease, or heart failure. Eating less sodium can help lower your blood pressure, reduce swelling, and protect your heart, liver, and kidneys. What are tips for following this plan? Reading food labels The Nutrition Facts label lists the amount of sodium in one serving of the food. If you eat more than one serving, you must multiply the listed amount of sodium by the number of servings. Choose foods with less than 140 mg of sodium per serving. Avoid foods with 300 mg of sodium or more per serving. Shopping  Look for lower-sodium products, often labeled as "low-sodium" or "no salt added." Always check the sodium content, even if foods are labeled as "unsalted" or "no salt added." Buy fresh foods. Avoid canned foods and pre-made or frozen meals. Avoid canned, cured, or processed meats. Buy breads that have less than 80 mg of sodium per slice. Cooking  Eat more home-cooked food and less  restaurant, buffet, and fast food. Avoid adding salt when cooking. Use salt-free seasonings or herbs instead of table salt or sea salt. Check with your health care provider or pharmacist before using salt substitutes. Cook with plant-based oils, such as canola, sunflower, or olive oil. Meal planning When eating at a restaurant, ask that your food be prepared with less salt or no salt, if possible. Avoid dishes labeled as brined, pickled, cured, smoked, or made with soy sauce, miso, or teriyaki sauce. Avoid foods that contain MSG (monosodium glutamate). MSG is sometimes added to [place] food, bouillon, and some canned foods. Make meals that can be grilled, baked, poached, roasted, or steamed. These are generally made with less sodium. General information Most people on this plan should limit their sodium intake to 1,500-2,000 mg (milligrams) of sodium each day. What foods should I eat? Fruits Fresh, frozen, or canned fruit. Fruit juice. Vegetables Fresh or frozen vegetables. "No salt added" canned vegetables. "No salt added" tomato sauce and paste. Low-sodium or reduced-sodium tomato and vegetable juice. Grains Low-sodium cereals, including oats, puffed wheat and rice, and shredded wheat. Low-sodium crackers. Unsalted rice. Unsalted pasta. Low-sodium bread. Whole-grain breads and whole-grain pasta. Meats and other proteins Fresh or frozen (no salt added) meat, poultry, seafood, and fish. Low-sodium canned tuna and salmon. Unsalted nuts. Dried peas, beans, and lentils without added salt. Unsalted  canned beans. Eggs. Unsalted nut butters. Dairy Milk. Soy milk. Cheese that is naturally low in sodium, such as ricotta cheese, fresh mozzarella, or Swiss cheese. Low-sodium or reduced-sodium cheese. Cream cheese. Yogurt. Seasonings and condiments Fresh and dried herbs and spices. Salt-free seasonings. Low-sodium mustard and ketchup. Sodium-free salad dressing. Sodium-free light mayonnaise. Fresh or  refrigerated horseradish. Lemon juice. Vinegar. Other foods Homemade, reduced-sodium, or low-sodium soups. Unsalted popcorn and pretzels. Low-salt or salt-free chips. The items listed above may not be a complete list of foods and beverages you can eat. Contact a dietitian for more information. What foods should I avoid? Vegetables Sauerkraut, pickled vegetables, and relishes. Olives. [place] fries. Onion rings. Regular canned vegetables (not low-sodium or reduced-sodium). Regular canned tomato sauce and paste (not low-sodium or reduced-sodium). Regular tomato and vegetable juice (not low-sodium or reduced-sodium). Frozen vegetables in sauces. Grains Instant hot cereals. Bread stuffing, pancake, and biscuit mixes. Croutons. Seasoned rice or pasta mixes. Noodle soup cups. Boxed or frozen macaroni and cheese. Regular salted crackers. Self-rising flour. Meats and other proteins Meat or fish that is salted, canned, smoked, spiced, or pickled. Precooked or cured meat, such as sausages or meat loaves. Alexander Calderon. Ham. Pepperoni. Hot dogs. Corned beef. Chipped beef. Salt pork. Jerky. Pickled herring. Anchovies and sardines. Regular canned tuna. Salted nuts. Dairy Processed cheese and cheese spreads. Hard cheeses. Cheese curds. Blue cheese. Feta cheese. String cheese. Regular cottage cheese. Buttermilk. Canned milk. Fats and oils Salted butter. Regular margarine. Ghee. Bacon fat. Seasonings and condiments Onion salt, garlic salt, seasoned salt, table salt, and sea salt. Canned and packaged gravies. Worcestershire sauce. Tartar sauce. Barbecue sauce. Teriyaki sauce. Soy sauce, including reduced-sodium. Steak sauce. Fish sauce. Oyster sauce. Cocktail sauce. Horseradish that you find on the shelf. Regular ketchup and mustard. Meat flavorings and tenderizers. Bouillon cubes. Hot sauce. Pre-made or packaged marinades. Pre-made or packaged taco seasonings. Relishes. Regular salad dressings. Salsa. Other foods Salted  popcorn and pretzels. Corn chips and puffs. Potato and tortilla chips. Canned or dried soups. Pizza. Frozen entrees and pot pies. The items listed above may not be a complete list of foods and beverages you should avoid. Contact a dietitian for more information. Summary Eating less sodium can help lower your blood pressure, reduce swelling, and protect your heart, liver, and kidneys. Most people on this plan should limit their sodium intake to 1,500-2,000 mg (milligrams) of sodium each day. Canned, boxed, and frozen foods are high in sodium. Restaurant foods, fast foods, and pizza are also very high in sodium. You also get sodium by adding salt to food. Try to cook at home, eat more fresh fruits and vegetables, and eat less fast food and canned, processed, or prepared foods. This information is not intended to replace advice given to you by your health care provider. Make sure you discuss any questions you have with your health care provider. Document Revised: 07/28/2019 Document Reviewed: 07/23/2019 Elsevier Patient Education  2023 [employer].

## 2023-04-28 ENCOUNTER — Other Ambulatory Visit: Payer: Self-pay | Admitting: Cardiology

## 2023-05-01 ENCOUNTER — Telehealth: Payer: Self-pay | Admitting: Pharmacy Technician

## 2023-05-01 ENCOUNTER — Other Ambulatory Visit (HOSPITAL_COMMUNITY): Payer: Self-pay

## 2023-05-01 NOTE — Telephone Encounter (Signed)
Pharmacy Patient Advocate Encounter   Received notification from CoverMyMeds that prior authorization for Pantoprazole is required/requested.   Insurance verification completed.   The patient is insured through  [employer]  .   Per test claim: PA required; PA submitted to [employer] via CoverMyMeds Key/confirmation #/[id number] Status is pending

## 2023-05-01 NOTE — Telephone Encounter (Signed)
Pharmacy Patient Advocate Encounter  Received notification from  [employer]  that Prior Authorization for pantoprazole has been APPROVED from 05/01/23 to 04/30/24. Ran test claim, Copay is $1.13. This test claim was processed through [facility]- copay amounts may vary at other pharmacies due to pharmacy/plan contracts, or as the patient moves through the different stages of their insurance plan.   PA #/Case ID/Reference #: [id number]

## 2023-06-04 ENCOUNTER — Other Ambulatory Visit: Payer: Self-pay | Admitting: Physician Assistant

## 2023-06-08 ENCOUNTER — Ambulatory Visit: Payer: Federal, State, Local not specified - PPO | Attending: Cardiovascular Disease | Admitting: Cardiovascular Disease

## 2023-06-08 ENCOUNTER — Encounter: Payer: Self-pay | Admitting: Cardiovascular Disease

## 2023-06-08 VITALS — BP 108/68 | HR 69 | Ht 65.0 in | Wt 150.0 lb

## 2023-06-08 DIAGNOSIS — E782 Mixed hyperlipidemia: Secondary | ICD-10-CM

## 2023-06-08 DIAGNOSIS — I2510 Atherosclerotic heart disease of native coronary artery without angina pectoris: Secondary | ICD-10-CM | POA: Diagnosis not present

## 2023-06-08 DIAGNOSIS — I10 Essential (primary) hypertension: Secondary | ICD-10-CM

## 2023-06-08 NOTE — Progress Notes (Signed)
Chief Complaint  Patient presents with   Follow-up    CAD   History of Present Illness: 62 yo male with history of CAD, HTN and asthma here today for cardiac follow up. His CAD dates back to 2000 when he had a bare metal stent placed in the RCA. Cardiac cath in 2002 with another bare metal stent placed in the RCA. He was admitted with unstable angina in March 2015 and cardiac cath showed severe distal RCA stenosis which was treated with a Promus drug eluting stent. Echo in 2014 with LVEF=65-70% with no valve disease. He was seen in July 2023 by Jari Favre, PA and reported abdominal pain with radiation to his chest. Nuclear stress test August 2023 with no ischemia. He was in [place] at a [employer] game in 08/13/22 and began to have chest pain. EMS was called and EKG was consistent with an inferior ST elevation MI. He was take to the [facility] and cardiac cath showed total occlusion of the right PDA and in stent restenosis proximal RCA stent. A 3.0 x 23 mm Xience drug eluting stent was placed in the PDA and a 4.0 x 33 mm Xience drug eluting stent was placed in the proximal RCA. Moderately severe LAD stenosis not treated. Echo with LVEF=50-55% with basal to mid inferior hypokinesis. He was discharged on ASA and Plavix. LDL 66. I saw him in April 2024 and he c/o chest pain. Cardiac cath 01/04/23 with placement of a drug eluting stent in the LAD.   He is here today for follow up. The patient denies any chest pain, dyspnea, palpitations, lower extremity edema, orthopnea, PND, dizziness, near syncope or syncope.    Primary Care Physician: Loyal Jacobson, MD  Past Medical History:  Diagnosis Date   Asthma    Coronary artery disease    a. history of BMS to RCA in 2000 and again in 2002. b. PTCA of the distal LCx in 2011. c. [place] 11/2013: s/p DES to distal RCA, normal LV function (done from L radial approach as pt has history of  difficulty engaging the LCA and significant radial  spasm from R radial approach).   Depression    GERD (gastroesophageal reflux disease)    Hyperlipidemia    Hypertension    Type 2 diabetes mellitus (HCC)     Past Surgical History:  Procedure Laterality Date   APPENDECTOMY     CHOLECYSTECTOMY N/A 11/11/2020   Procedure: LAPAROSCOPIC CHOLECYSTECTOMY WITH  INTRAOPERATIVE CHOLANGIOGRAM;  Surgeon: Luretha Murphy, MD;  Location: [facility];  Service: General;  Laterality: N/A;   CORONARY ANGIOPLASTY WITH STENT PLACEMENT     CORONARY STENT INTERVENTION N/A 01/04/2023   Procedure: CORONARY STENT INTERVENTION;  Surgeon: Kathleene Hazel, MD;  Location: [facility] INVASIVE CV LAB;  Service: Cardiovascular;  Laterality: N/A;   LEFT HEART CATH AND CORONARY ANGIOGRAPHY N/A 01/04/2023   Procedure: LEFT HEART CATH AND CORONARY ANGIOGRAPHY;  Surgeon: Kathleene Hazel, MD;  Location: [facility] INVASIVE CV LAB;  Service: Cardiovascular;  Laterality: N/A;   LEFT HEART CATHETERIZATION WITH CORONARY ANGIOGRAM N/A 11/17/2013   Procedure: LEFT HEART CATHETERIZATION WITH CORONARY ANGIOGRAM;  Surgeon: Peter M Swaziland, MD;  Location: [facility] CATH LAB;  Service: Cardiovascular;  Laterality: N/A;    Current Outpatient Medications  Medication Sig Dispense Refill   albuterol (PROVENTIL HFA;VENTOLIN HFA) 108 (90 BASE) MCG/ACT inhaler Inhale 2 puffs into the lungs every 6 (six) hours as needed for wheezing.     aspirin 81 MG tablet  Take 81 mg by mouth daily.     azelastine (ASTELIN) 0.1 % nasal spray Place 1 spray into both nostrils daily as needed for rhinitis. Use in each nostril as directed     cetirizine (ZYRTEC) 10 MG tablet Take 10 mg by mouth daily as needed for allergies.     clopidogrel (PLAVIX) 75 MG tablet Take 1 tablet (75 mg total) by mouth daily. 90 tablet 3   ezetimibe (ZETIA) 10 MG tablet Take 1 tablet (10 mg total) by mouth at bedtime. 90 tablet 3   ferrous sulfate 325 (65 FE) MG tablet Take 325 mg by mouth 2 (two) times daily.     fluticasone (FLONASE) 50 MCG/ACT  nasal spray Place 1 spray into both nostrils as needed for allergies.      HYDROcodone-acetaminophen (NORCO/VICODIN) 5-325 MG tablet Take 1 tablet by mouth 3 (three) times daily as needed (cough).     hyoscyamine (LEVBID) 0.375 MG 12 hr tablet Take 0.375 mg by mouth 2 (two) times daily.     metFORMIN (GLUCOPHAGE-XR) 500 MG 24 hr tablet Take 1 tablet (500 mg total) by mouth daily. Take 1 tablet (500 mg total) by mouth daily. Do not resume taking until 01/07/23.     metoprolol succinate (TOPROL-XL) 25 MG 24 hr tablet Take 1 tablet (25 mg total) by mouth daily. 90 tablet 3   mirtazapine (REMERON) 45 MG tablet Take 45 mg by mouth at bedtime.     nitroGLYCERIN (NITROSTAT) 0.4 MG SL tablet DISSOLVE 1 TABLET UNDER TONGUE EVERY 5 MINUTES FOR UP TO 3 DOSES AS NEEDED FOR CHEST PAIN 25 tablet 5   OXcarbazepine (TRILEPTAL) 150 MG tablet Take 150 mg by mouth 2 (two) times daily.     pantoprazole (PROTONIX) 40 MG tablet TAKE ONE (1) TABLET BY MOUTH EVERY DAY 30 tablet 3   rosuvastatin (CRESTOR) 40 MG tablet Take 1 tablet (40 mg total) by mouth at bedtime. 90 tablet 3   sildenafil (VIAGRA) 100 MG tablet Take 100 mg by mouth daily as needed for erectile dysfunction.     SINGULAIR 10 MG tablet Take 10 mg by mouth at bedtime.     sitaGLIPtin (JANUVIA) 100 MG tablet Take 100 mg by mouth daily.     Testosterone 1.62 % GEL Place 2 Pump onto the skin daily.     valsartan (DIOVAN) 160 MG tablet Take 1 tablet (160 mg total) by mouth daily. 90 tablet 3   No current facility-administered medications for this visit.    Allergies  Allergen Reactions   Dextromethorphan Nausea And Vomiting    Social History   Socioeconomic History   Marital status: Married    Spouse name: Not on file   Number of children: 2   Years of education: Not on file   Highest education level: Not on file  Occupational History    Employer: [employer]  Tobacco Use   Smoking status: Never   Smokeless tobacco: Never  Vaping Use    Vaping status: Never Used  Substance and Sexual Activity   Alcohol use: Yes    Alcohol/week: 1.0 standard drink of alcohol    Types: 1 drink(s) per week   Drug use: No   Sexual activity: Not on file  Other Topics Concern   Not on file  Social History Narrative   [place] . He works at the post [employer]. He denies tobacco use, drinks alcohol 2-3 times per week, and uses  energy supplements occasionally   Social Determinants of Health   Financial Resource Strain: Not on file  Food Insecurity: Low Risk  (05/30/2023)   Received from [facility]   Hunger Vital Sign    Worried About Running Out of Food in the Last Year: Never true    Ran Out of Food in the Last Year: Never true  Transportation Needs: No Transportation Needs (05/30/2023)   Received from [facility]    In the past 12 months, has lack of reliable transportation kept you from medical appointments, meetings, work or from getting things needed for daily living? : No  Physical Activity: Unknown (07/01/2021)   Received [facility] visits prior to 11/04/2022., [facility] visits prior to 11/04/2022.   Exercise Vital Sign    Days of Exercise per Week: Patient refused    Minutes of Exercise per Session: Not on file  Stress: Unknown (07/01/2021)   Received from [facility] visits prior to 11/04/2022., [facility] visits prior to 11/04/2022.   [employer] of Occupational Health - Occupational Stress Questionnaire    Feeling of Stress : Patient refused  Social Connections: Unknown (07/01/2021)   [facility] visits prior to 11/04/2022., [facility] visits prior to 11/04/2022.   Social Connection and Isolation Panel [NHANES]    Frequency of Communication with Friends and Family: Patient refused    Frequency of Social Gatherings with  Friends and Family: Patient refused    Attends Religious Services: Patient refused    Active Member of Clubs or Organizations: Patient refused    Attends Banker Meetings: Patient refused    Marital Status: Patient refused  Intimate Partner Violence: Unknown (07/01/2021)   [facility] visits prior to 11/04/2022., [facility] visits prior to 11/04/2022.   Humiliation, Afraid, Rape, and Kick questionnaire    Fear of Current or Ex-Partner: Patient refused    Emotionally Abused: Patient refused    Physically Abused: Patient refused    Sexually Abused: Patient refused    Family History  Problem Relation Age of Onset   Heart attack Father        age 83   Coronary artery disease Brother        onset  CAD age 37   Review of Systems:  As stated in the HPI and otherwise negative.   BP 108/68   Pulse 69   Ht 5\' 5"  (1.651 m)   Wt 68 kg   SpO2 97%   BMI 24.96 kg/m   Physical Examination: General: Well developed, well nourished, NAD  HEENT: OP clear, mucus membranes moist  SKIN: warm, dry. No rashes. Neuro: No focal deficits  Musculoskeletal: Muscle strength 5/5 all ext  Psychiatric: Mood and affect normal  Neck: No JVD, no carotid bruits, no thyromegaly, no lymphadenopathy.  Lungs:Clear bilaterally, no wheezes, rhonci, crackles Cardiovascular: Regular rate and rhythm. No murmurs, gallops or rubs. Abdomen:Soft. Bowel sounds present. Non-tender.  Extremities: No lower extremity edema. Pulses are 2 + in the bilateral DP/PT.  EKG:  EKG is not ordered today. The ekg ordered today demonstrates   Recent Labs: 01/01/2023: BUN 18; Creatinine, Ser 0.94; Hemoglobin 10.5; Platelets 222; Potassium 4.5; Sodium 146   Lipid Panel    Component Value Date/Time   CHOL 141 11/17/2013 0255   TRIG 289 (H) 11/17/2013 0255  HDL 43 11/17/2013 0255   CHOLHDL 3.3 11/17/2013 0255   VLDL 58 (H) 11/17/2013 0255   LDLCALC 40 11/17/2013  0255     Wt Readings from Last 3 Encounters:  06/08/23 68 kg  01/25/23 65.6 kg  01/04/23 68 kg    Assessment and Plan:   1. CAD with stable angina: No chest pain suggestive of unstable angina. Will continue DAPT with ASA and Plavix. Continue statin, Zetia, Toprol and Diovan.   2. HTN: BP is well controlled. Continue current therapy  3. HLD: Lipids followed in primary care. LDL 66 in December 2023 (in [place]). Continue statin.   Labs/ tests ordered today include:   No orders of the defined types were placed in this encounter.  Disposition:   F/U with me in 12  months  Signed, Verne Carrow, MD 06/08/2023 3:49 PM    [facility] [address] [place], [place], [place]  [address] Phone: [phone]; Fax: [phone]    .

## 2023-06-08 NOTE — Patient Instructions (Signed)

## 2023-10-19 ENCOUNTER — Other Ambulatory Visit: Payer: Self-pay | Admitting: Cardiovascular Disease

## 2023-10-20 ENCOUNTER — Other Ambulatory Visit: Payer: Self-pay | Admitting: Cardiovascular Disease

## 2024-01-15 ENCOUNTER — Telehealth: Payer: Self-pay

## 2024-01-15 NOTE — Telephone Encounter (Signed)
   Pre-operative Risk Assessment    Patient Name: Alexander Calderon  DOB: 1961-07-12 MRN: [id number]   Date of last office visit: 06/08/23 Antoinette Batman, MD Date of next office visit: NONE   Request for Surgical Clearance    Procedure:  EGD  Date of Surgery:  Clearance TBD                                Surgeon:  DR Leldon Push [facility] Phone number:  [phone] Fax number:  [phone]   Type of Clearance Requested:   - Medical  - Pharmacy:  Hold Aspirin  and Clopidogrel  (Plavix ) 5 DAYS PRIOR   Type of Anesthesia:  Not Indicated   Additional requests/questions:    Signed, Collin Deal   01/15/2024, 5:07 PM

## 2024-01-17 ENCOUNTER — Telehealth: Payer: Self-pay | Admitting: *Deleted

## 2024-01-17 NOTE — Telephone Encounter (Signed)
   Name: Alexander Calderon  DOB: 1961-04-29  MRN: [id number]  Primary Cardiologist: Antoinette Batman, MD   Preoperative team, please contact this patient and set up a phone call appointment for further preoperative risk assessment. Please obtain consent and complete medication review. Thank you for your help.  I confirm that guidance regarding antiplatelet and oral anticoagulation therapy has been completed and, if necessary, noted below.  Per office protocol, if patient is without any new symptoms or concerns at the time of their virtual visit, he may hold Aspirin  and Plavix  for 5 days prior to procedure. Please resume Aspirin  and Plavix  as soon as possible postprocedure, at the discretion of the surgeon.    I also confirmed the patient resides in the state of [place] . As per [facility] Medical Board telemedicine laws, the patient must reside in the state in which the provider is licensed.   Ava Boatman, NP 01/17/2024, 9:32 AM [facility]

## 2024-01-17 NOTE — Telephone Encounter (Signed)
 Pt has been scheduled tele preop appt 01/23/24. Pt states requesting office not scheduling procedure until the pt has been cleared.   Med rec and consent are done.

## 2024-01-17 NOTE — Telephone Encounter (Signed)
 Pt has been scheduled tele preop appt 01/23/24. Pt states requesting office not scheduling procedure until the pt has been cleared.   Med rec and consent are done.      Patient Consent for Virtual Visit        Alexander Calderon has provided verbal consent on 01/17/2024 for a virtual visit (video or telephone).   CONSENT FOR VIRTUAL VISIT FOR:  Alexander Calderon  By participating in this virtual visit I agree to the following:  I hereby voluntarily request, consent and authorize [facility] and its employed or contracted physicians, physician assistants, nurse practitioners or other licensed health care professionals (the Practitioner), to provide me with telemedicine health care services (the "Services") as deemed necessary by the treating Practitioner. I acknowledge and consent to receive the Services by the Practitioner via telemedicine. I understand that the telemedicine visit will involve communicating with the Practitioner through live audiovisual communication technology and the disclosure of certain medical information by electronic transmission. I acknowledge that I have been given the opportunity to request an in-person assessment or other available alternative prior to the telemedicine visit and am voluntarily participating in the telemedicine visit.  I understand that I have the right to withhold or withdraw my consent to the use of telemedicine in the course of my care at any time, without affecting my right to future care or treatment, and that the Practitioner or I may terminate the telemedicine visit at any time. I understand that I have the right to inspect all information obtained and/or recorded in the course of the telemedicine visit and may receive copies of available information for a reasonable fee.  I understand that some of the potential risks of receiving the Services via telemedicine include:  Delay or interruption in medical evaluation due to technological equipment  failure or disruption; Information transmitted may not be sufficient (e.g. poor resolution of images) to allow for appropriate medical decision making by the Practitioner; and/or  In rare instances, security protocols could fail, causing a breach of personal health information.  Furthermore, I acknowledge that it is my responsibility to provide information about my medical history, conditions and care that is complete and accurate to the best of my ability. I acknowledge that Practitioner's advice, recommendations, and/or decision may be based on factors not within their control, such as incomplete or inaccurate data provided by me or distortions of diagnostic images or specimens that may result from electronic transmissions. I understand that the practice of medicine is not an exact science and that Practitioner makes no warranties or guarantees regarding treatment outcomes. I acknowledge that a copy of this consent can be made available to me via my patient portal [facility] MyChart), or I can request a printed copy by calling the office of [facility].    I understand that my insurance will be billed for this visit.   I have read or had this consent read to me. I understand the contents of this consent, which adequately explains the benefits and risks of the Services being provided via telemedicine.  I have been provided ample opportunity to ask questions regarding this consent and the Services and have had my questions answered to my satisfaction. I give my informed consent for the services to be provided through the use of telemedicine in my medical care

## 2024-01-23 ENCOUNTER — Ambulatory Visit: Attending: Cardiology | Admitting: Emergency Medicine

## 2024-01-23 DIAGNOSIS — Z01810 Encounter for preprocedural cardiovascular examination: Secondary | ICD-10-CM

## 2024-01-23 NOTE — Progress Notes (Signed)
 Virtual Visit via Telephone Note   Because of Chelsey Kimberley co-morbid illnesses, he is at least at moderate risk for complications without adequate follow up.  This format is felt to be most appropriate for this patient at this time.  Due to technical limitations with video connection (technology), today's appointment will be conducted as an audio only telehealth visit, and Alexander Calderon verbally agreed to proceed in this manner.   All issues noted in this document were discussed and addressed.  No physical exam could be performed with this format.  Evaluation Performed:  Preoperative cardiovascular risk assessment _____________   Date:  01/23/2024   Patient ID:  Alexander Calderon, DOB 10-07-1960, MRN [id number] Patient Location:  Home Provider location:   Office  Primary Care Provider:  Jayne Mews, MD Primary Cardiologist:  Antoinette Batman, MD  Chief Complaint / Patient Profile   63 y.o. y/o male with a h/o coronary artery disease with stable angina, hypertension, hyperlipidemia, asthma who is pending EGD with [facility] on date TBD and presents today for telephonic preoperative cardiovascular risk assessment.  History of Present Illness    Alexander Calderon is a 63 y.o. male who presents via audio/video conferencing for a telehealth visit today.  Pt was last seen in cardiology clinic on 06/08/2023 by Dr. Abel Hoe.  At that time Alexander Calderon was doing well.  The patient is now pending procedure as outlined above. Since his last visit, he denies chest pain, shortness of breath, lower extremity edema, fatigue, palpitations, melena, hematuria, hemoptysis, diaphoresis, weakness, presyncope, syncope, orthopnea, and PND.  Today patient is doing well overall.  He denies any acute cardiovascular concerns or complaints.  He is specifically without any chest pain or exertional angina.  Notes that he does stay relatively active without issue or cardiac decompensation.  He is easily  able to achieve greater than 4 METS.  Past Medical History    Past Medical History:  Diagnosis Date   Asthma    Coronary artery disease    a. history of BMS to RCA in 2000 and again in 2002. b. PTCA of the distal LCx in 2011. c. [place]  11/2013: s/p DES to distal RCA, normal LV function (done from L radial approach as pt has history of  difficulty engaging the LCA and significant radial spasm from R radial approach).   Depression    GERD (gastroesophageal reflux disease)    Hyperlipidemia    Hypertension    Type 2 diabetes mellitus (HCC)    Past Surgical History:  Procedure Laterality Date   APPENDECTOMY     CHOLECYSTECTOMY N/A 11/11/2020   Procedure: LAPAROSCOPIC CHOLECYSTECTOMY WITH  INTRAOPERATIVE CHOLANGIOGRAM;  Surgeon: Jacolyn Matar, MD;  Location: [facility];  Service: General;  Laterality: N/A;   CORONARY ANGIOPLASTY WITH STENT PLACEMENT     CORONARY STENT INTERVENTION N/A 01/04/2023   Procedure: CORONARY STENT INTERVENTION;  Surgeon: Odie Benne, MD;  Location: [facility] IN[facility]ASIVE CV [facility];  Service: Cardiovascular;  Laterality: N/A;   LEFT HEART CATH AND CORONARY ANGIOGRAPHY N/A 01/04/2023   Procedure: LEFT HEART CATH AND CORONARY ANGIOGRAPHY;  Surgeon: Odie Benne, MD;  Location: [facility] CV [facility];  Service: Cardiovascular;  Laterality: N/A;   LEFT HEART CATHETERIZATION WITH CORONARY ANGIOGRAM N/A 11/17/2013   Procedure: LEFT HEART CATHETERIZATION WITH CORONARY ANGIOGRAM;  Surgeon: Peter M Swaziland, MD;  Location: [facility] CATH LAB;  Service: Cardiovascular;  Laterality: N/A;    Allergies  Allergies  Allergen Reactions   Dextromethorphan  Nausea And Vomiting    Home  Medications    Prior to Admission medications   Medication Sig Start Date End Date Taking? Authorizing Provider  albuterol  (PROVENTIL  HFA;VENTOLIN  HFA) 108 (90 BASE) MCG/ACT inhaler Inhale 2 puffs into the lungs every 6 (six) hours as needed for wheezing.    [provider]  Ascorbic Acid (VITAMIN C)  500 MG CAPS Take 1 capsule by mouth in the morning and at bedtime.    [provider]  aspirin  81 MG tablet Take 81 mg by mouth daily.    [provider]  azelastine  (ASTELIN ) 0.1 % nasal spray Place 1 spray into both nostrils daily as needed for rhinitis. Use in each nostril as directed    [provider]  cetirizine (ZYRTEC) 10 MG tablet Take 10 mg by mouth daily as needed for allergies. 01/02/23   [provider]  clopidogrel  (PLAVIX ) 75 MG tablet TAKE ONE (1) TABLET BY MOUTH EVERY DAY 10/23/23   Odie Benne, MD  ezetimibe  (ZETIA ) 10 MG tablet Take 1 tablet (10 mg total) by mouth at bedtime. 03/21/22   Von Grumbling, PA-C  ferrous sulfate  325 (65 FE) MG tablet Take 325 mg by mouth 2 (two) times daily.    [provider]  fluticasone  (FLONASE ) 50 MCG/ACT nasal spray Place 1 spray into both nostrils as needed for allergies.  06/25/19   [provider]  HYDROcodone -acetaminophen  (NORCO/VICODIN) 5-325 MG tablet Take 1 tablet by mouth 3 (three) times daily as needed (cough). Patient not taking: Reported on 01/17/2024    [provider]  hyoscyamine  (LEVBID) 0.375 MG 12 hr tablet Take 0.375 mg by mouth 2 (two) times daily. 01/09/19   [provider]  metFORMIN  (GLUCOPHAGE -XR) 500 MG 24 hr tablet Take 1 tablet (500 mg total) by mouth daily. Take 1 tablet (500 mg total) by mouth daily. Do not resume taking until 01/07/23. Patient taking differently: Take 2,000 mg by mouth at bedtime. 01/04/23   Williams, Evan, PA-C  metoprolol  succinate (TOPROL -XL) 25 MG 24 hr tablet TAKE ONE (1) TABLET BY MOUTH EVERY DAY 10/19/23   Odie Benne, MD  mirtazapine  (REMERON ) 45 MG tablet Take 45 mg by mouth at bedtime. Patient not taking: Reported on 01/17/2024 11/08/13   [provider]  nitroGLYCERIN  (NITROSTAT ) 0.4 MG SL tablet DISSOLVE 1 TABLET UNDER TONGUE EVERY 5 MINUTES FOR UP TO 3 DOSES AS NEEDED FOR CHEST PAIN Patient not  taking: Reported on 01/17/2024 06/04/23   Von Grumbling, PA-C  OXcarbazepine  (TRILEPTAL ) 150 MG tablet Take 150 mg by mouth 2 (two) times daily.    [provider]  pantoprazole  (PROTONIX ) 40 MG tablet TAKE ONE (1) TABLET BY MOUTH EVERY DAY Patient taking differently: Take 40 mg by mouth 2 (two) times daily before a meal. 04/30/23   Odie Benne, MD  rosuvastatin  (CRESTOR ) 40 MG tablet Take 1 tablet (40 mg total) by mouth at bedtime. 03/21/22   Von Grumbling, PA-C  sildenafil (VIAGRA) 100 MG tablet Take 100 mg by mouth daily as needed for erectile dysfunction.    [provider]  SINGULAIR  10 MG tablet Take 10 mg by mouth at bedtime. 02/14/11   [provider]  sitaGLIPtin (JANUVIA) 100 MG tablet Take 100 mg by mouth daily. 06/25/19   [provider]  Testosterone 1.62 % GEL Place 2 Pump onto the skin daily.    [provider]  valsartan  (DIOVAN ) 160 MG tablet Take 1 tablet (160 mg total) by mouth daily. 03/21/22   Rueben Cote,  Vanna Genta, PA-C    Physical Exam    Vital Signs:  Parv Manthey does not have vital signs available for review today.  Given telephonic nature of communication, physical exam is limited. AAOx3. NAD. Normal affect.  Speech and respirations are unlabored.  Accessory Clinical Findings    None  Assessment & Plan    1.  Preoperative Cardiovascular Risk Assessment: According to the Revised Cardiac Risk Index (RCRI), his Perioperative Risk of Major Cardiac Event is (%): 0.9. His Functional Capacity in METs is: 7.25 according to the Duke Activity Status Index (DASI). Therefore, based on ACC/AHA guidelines, patient would be at acceptable risk for the planned procedure without further cardiovascular testing.   The patient was advised that if he develops new symptoms prior to surgery to contact our office to arrange for a follow-up visit, and he verbalized understanding.  He may hold Plavix  for 5 days prior to procedure. Please  resume Plavix  as soon as possible postprocedure, at the discretion of the surgeon.    Ideally aspirin  should be continued without interruption, however if the bleeding risk is too great, aspirin  may be held for 5-7 days prior to surgery. Please resume aspirin  post operatively when it is felt to be safe from a bleeding standpoint.    A copy of this note will be routed to requesting surgeon.  Time:   Today, I have spent 7 minutes with the patient with telehealth technology discussing medical history, symptoms, and management plan.     Ava Boatman, NP  01/23/2024, 10:30 AM

## 2024-02-13 NOTE — Telephone Encounter (Signed)
 Received 2nd preop clearance request from [facility] for colonoscopy date TBD.  Will resend clearance notes to the surgeons office.

## 2024-02-15 NOTE — Telephone Encounter (Signed)
 Patient is low risk for EGD.  Gerldine Koch, NP-C  02/15/2024, 4:25 PM [address] [place], [place] [address] Office [phone] Fax [phone]

## 2024-02-15 NOTE — Telephone Encounter (Signed)
 Duplicate request received today. Pt has been cleared by Palmer Bobo, NP 01/23/24.   Requesting office is asking for documentation whether low risk, medium risk or high risk.

## 2024-03-04 NOTE — Telephone Encounter (Signed)
 Called patient back made him aware clearance has been sent to surgeon's office and I resent it again just in case. Patient voiced understanding he is going to call their office

## 2024-03-04 NOTE — Telephone Encounter (Signed)
 Pt calling in regards to a update. Please advise

## 2024-03-05 NOTE — Telephone Encounter (Signed)
 I re-faxed clearance notes to the new fax number [phone]) provided by patient. Patient can reach out to requesting office to see if they received the notes and his status on his clearance.

## 2024-03-05 NOTE — Telephone Encounter (Signed)
  Pt is calling back to follow up clearance, he gave new fax number [phone]. He is requesting if it can be refax today

## 2024-03-05 NOTE — Telephone Encounter (Signed)
 I did try to call pt to inform them that the notes were resent to the requesting office. However, the pt did not answer and mailbox was full.

## 2024-03-24 ENCOUNTER — Telehealth: Payer: Self-pay

## 2024-03-24 ENCOUNTER — Other Ambulatory Visit (HOSPITAL_COMMUNITY): Payer: Self-pay

## 2024-03-24 NOTE — Telephone Encounter (Signed)
 Pharmacy Patient Advocate Encounter   Received notification from CoverMyMeds that prior authorization for PANTOPRAZOLE  is required/requested.   Insurance verification completed.   The patient is insured through [facility] .   Per test claim: The current 30 day co-pay is, $4.08.  No PA needed at this time. This test claim was processed through [facility]- copay amounts may vary at other pharmacies due to pharmacy/plan contracts, or as the patient moves through the different stages of their insurance plan.

## 2024-04-08 NOTE — Telephone Encounter (Signed)
 2nd clearance request received. Reentering clearance request due to last request being older then 2 months. TELE Preop visit was 01/23/24 with Lum Louis, NP     Pre-operative Risk Assessment    Patient Name: Alexander Calderon  DOB: [birth year] MRN: [id number]   Date of last office visit: 06/08/23 LONNI CASH, MD Date of next office visit: NONE   Request for Surgical Clearance    Procedure:  EGD  Date of Surgery:  Clearance TBD                                Surgeon:  MONTEL JINNY LIED, MD Surgeon's Group or Practice Name:  [facility] Phone number:  [phone] Fax number:  [phone]   Type of Clearance Requested:   - Medical  - Pharmacy:  Hold Aspirin  (REQUEST DOES NOT ASK FOR A HOLD) PLAVIX  HOLD 5 DAYS PRIOR   Type of Anesthesia:  Not Indicated   Additional requests/questions:    Signed, Lucie DELENA Ku   04/08/2024, 2:28 PM

## 2024-04-08 NOTE — Telephone Encounter (Signed)
   Name: Alexander Calderon  DOB: 03-04-1961  MRN: [id number]  Primary Cardiologist: Lonni Cash, MD   Preoperative team, please contact this patient and set up a phone call appointment for further preoperative risk assessment. Please obtain consent and complete medication review. Thank you for your help.  I confirm that guidance regarding antiplatelet and oral anticoagulation therapy has been completed and, if necessary, noted below.  He may hold Plavix  for 5 days prior to procedure. Please resume Plavix  as soon as possible postprocedure, at the discretion of the surgeon.     Ideally aspirin  should be continued without interruption, however if the bleeding risk is too great, aspirin  may be held for 5-7 days prior to surgery. Please resume aspirin  post operatively when it is felt to be safe from a bleeding standpoint.   I also confirmed the patient resides in the state of [place] . As per [facility] Medical [employer] telemedicine laws, the patient must reside in the state in which the provider is licensed.  Courtany Mcmurphy D Roger Kettles, NP 04/08/2024, 2:38 PM [facility]

## 2024-04-08 NOTE — Telephone Encounter (Signed)
 Spoke with pt. Pt stated that the EGD procedure has already been completed.

## 2024-04-08 NOTE — Telephone Encounter (Signed)
 Tried calling the pt and was unable to leave a message. It rings then hangs up will try to call again in a little bit.

## 2024-04-14 NOTE — Progress Notes (Signed)
 Contacted patient and discussed lab results. Patient verbalizes understanding. No questions/concerned voiced during time of call.

## 2024-04-16 ENCOUNTER — Encounter: Payer: Self-pay | Admitting: Cardiovascular Disease

## 2024-04-17 ENCOUNTER — Other Ambulatory Visit: Payer: Self-pay

## 2024-04-17 ENCOUNTER — Encounter: Payer: Self-pay | Admitting: Cardiovascular Disease

## 2024-04-17 NOTE — Telephone Encounter (Signed)
 Awaiting provider response.

## 2024-04-21 MED ORDER — PANTOPRAZOLE SODIUM 40 MG PO TBEC: 40.0000 mg | DELAYED_RELEASE_TABLET | Freq: Two times a day (BID) | ORAL | 3 refills | Status: AC

## 2024-04-21 NOTE — Addendum Note (Signed)
 Addended by: KRASOWSKI, Zayneb Baucum on: 04/21/2024 01:28 PM   Modules accepted: Orders

## 2024-06-12 ENCOUNTER — Encounter: Payer: Self-pay | Admitting: Cardiovascular Disease

## 2024-06-12 ENCOUNTER — Ambulatory Visit: Attending: Cardiovascular Disease | Admitting: Cardiovascular Disease

## 2024-06-12 VITALS — BP 113/67 | HR 76 | Ht 65.0 in | Wt 145.0 lb

## 2024-06-12 DIAGNOSIS — I2510 Atherosclerotic heart disease of native coronary artery without angina pectoris: Secondary | ICD-10-CM

## 2024-06-12 DIAGNOSIS — I10 Essential (primary) hypertension: Secondary | ICD-10-CM | POA: Diagnosis not present

## 2024-06-12 DIAGNOSIS — E782 Mixed hyperlipidemia: Secondary | ICD-10-CM | POA: Diagnosis not present

## 2024-06-12 NOTE — Patient Instructions (Signed)
 Medication Instructions:  Your physician recommends that you continue on your current medications as directed. Please refer to the Current Medication list given to you today.  *If you need a refill on your cardiac medications before your next appointment, please call your pharmacy*  Lab Work: None ordered  If you have labs (blood work) drawn today and your tests are completely normal, you will receive your results only by: MyChart Message (if you have MyChart) OR A paper copy in the mail If you have any lab test that is abnormal or we need to change your treatment, we will call you to review the results.  Testing/Procedures: None ordered  Follow-Up: At [facility], you and your health needs are our priority.  As part of our continuing mission to provide you with exceptional heart care, our providers are all part of one team.  This team includes your primary Cardiologist (physician) and Advanced Practice Providers or APPs (Physician Assistants and Nurse Practitioners) who all work together to provide you with the care you need, when you need it.  Your next appointment:   12 month(s)  Provider:   Antoinette Batman, MD    We recommend signing up for the patient portal called "MyChart".  Sign up information is provided on this After Visit Summary.  MyChart is used to connect with patients for Virtual Visits (Telemedicine).  Patients are able to view lab/test results, encounter notes, upcoming appointments, etc.  Non-urgent messages can be sent to your provider as well.   To learn more about what you can do with MyChart, go to [email or web address].   Other Instructions

## 2024-06-12 NOTE — Progress Notes (Signed)
 Chief Complaint  Patient presents with   Follow-up    CAD   History of Present Illness: 63 yo male with history of CAD, HTN and asthma here today for cardiac follow up. His CAD dates back to 2000 when he had a bare metal stent placed in the RCA. Cardiac cath in 2002 with another bare metal stent placed in the RCA. He was admitted with unstable angina in March 2015 and cardiac cath showed severe distal RCA stenosis which was treated with a Promus drug eluting stent. Echo in 2014 with LVEF=65-70% with no valve disease. He was seen in July 2023 by Orren Fabry, PA and reported abdominal pain with radiation to his chest. Nuclear stress test August 2023 with no ischemia. He was in [place] in December 2023 and had an inferior STEMI. Cath with total occlusion of the right PDA and in stent restenosis proximal RCA stent. A 3.0 x 23 mm Xience drug eluting stent was placed in the PDA and a 4.0 x 33 mm Xience drug eluting stent was placed in the proximal RCA. Moderately severe LAD stenosis not treated. Echo with LVEF=50-55% with basal to mid inferior hypokinesis. I saw him in April 2024 and he c/o chest pain. Cardiac cath 01/04/23 with placement of a drug eluting stent in the LAD.   He is here today for follow up. The patient denies any chest pain, dyspnea, palpitations, lower extremity edema, orthopnea, PND, dizziness, near syncope or syncope.    Primary Care Physician: Millicent Sharper, MD  Past Medical History:  Diagnosis Date   Asthma    Coronary artery disease    a. history of BMS to RCA in 2000 and again in 2002. b. PTCA of the distal LCx in 2011. c. [place]  11/2013: s/p DES to distal RCA, normal LV function (done from L radial approach as pt has history of  difficulty engaging the LCA and significant radial spasm from R radial approach).   Depression    GERD (gastroesophageal reflux disease)    Hyperlipidemia    Hypertension    Type 2 diabetes mellitus (HCC)     Past Surgical History:  Procedure  Laterality Date   APPENDECTOMY     CHOLECYSTECTOMY N/A 11/11/2020   Procedure: LAPAROSCOPIC CHOLECYSTECTOMY WITH  INTRAOPERATIVE CHOLANGIOGRAM;  Surgeon: Gladis Cough, MD;  Location: [facility];  Service: General;  Laterality: N/A;   CORONARY ANGIOPLASTY WITH STENT PLACEMENT     CORONARY STENT INTERVENTION N/A 01/04/2023   Procedure: CORONARY STENT INTERVENTION;  Surgeon: Verlin Lonni BIRCH, MD;  Location: [facility];  Service: Cardiovascular;  Laterality: N/A;   LEFT HEART CATH AND CORONARY ANGIOGRAPHY N/A 01/04/2023   Procedure: LEFT HEART CATH AND CORONARY ANGIOGRAPHY;  Surgeon: Verlin Lonni BIRCH, MD;  Location: [facility];  Service: Cardiovascular;  Laterality: N/A;   LEFT HEART CATHETERIZATION WITH CORONARY ANGIOGRAM N/A 11/17/2013   Procedure: LEFT HEART CATHETERIZATION WITH CORONARY ANGIOGRAM;  Surgeon: Peter M Swaziland, MD;  Location: [facility] CATH LAB;  Service: Cardiovascular;  Laterality: N/A;    Current Outpatient Medications  Medication Sig Dispense Refill   albuterol  (PROVENTIL  HFA;VENTOLIN  HFA) 108 (90 BASE) MCG/ACT inhaler Inhale 2 puffs into the lungs every 6 (six) hours as needed for wheezing.     Ascorbic Acid (VITAMIN C) 500 MG CAPS Take 1 capsule by mouth in the morning and at bedtime.     aspirin  81 MG tablet Take 81 mg by mouth daily.     azelastine  (ASTELIN ) 0.1 % nasal spray  Place 1 spray into both nostrils daily as needed for rhinitis. Use in each nostril as directed     cetirizine (ZYRTEC) 10 MG tablet Take 10 mg by mouth daily as needed for allergies.     clopidogrel  (PLAVIX ) 75 MG tablet TAKE ONE (1) TABLET BY MOUTH EVERY DAY 90 tablet 2   ezetimibe  (ZETIA ) 10 MG tablet Take 1 tablet (10 mg total) by mouth at bedtime. 90 tablet 3   ferrous sulfate  325 (65 FE) MG tablet Take 325 mg by mouth 2 (two) times daily.     fluticasone  (FLONASE ) 50 MCG/ACT nasal spray Place 1 spray into both nostrils as needed for allergies.      HYDROcodone -acetaminophen   (NORCO/VICODIN) 5-325 MG tablet Take 1 tablet by mouth 3 (three) times daily as needed (cough).     hyoscyamine  (LEVBID ) 0.375 MG 12 hr tablet Take 0.375 mg by mouth 2 (two) times daily.     metFORMIN  (GLUCOPHAGE -XR) 500 MG 24 hr tablet Take 1 tablet (500 mg total) by mouth daily. Take 1 tablet (500 mg total) by mouth daily. Do not resume taking until 01/07/23. (Patient taking differently: Take 2,000 mg by mouth at bedtime.)     metoprolol  succinate (TOPROL -XL) 25 MG 24 hr tablet TAKE ONE (1) TABLET BY MOUTH EVERY DAY 90 tablet 2   mirtazapine  (REMERON ) 45 MG tablet Take 45 mg by mouth at bedtime.     nitroGLYCERIN  (NITROSTAT ) 0.4 MG SL tablet DISSOLVE 1 TABLET UNDER TONGUE EVERY 5 MINUTES FOR UP TO 3 DOSES AS NEEDED FOR CHEST PAIN 25 tablet 5   OXcarbazepine  (TRILEPTAL ) 150 MG tablet Take 150 mg by mouth 2 (two) times daily.     pantoprazole  (PROTONIX ) 40 MG tablet Take 1 tablet (40 mg total) by mouth 2 (two) times daily before a meal. 180 tablet 3   rosuvastatin  (CRESTOR ) 40 MG tablet Take 1 tablet (40 mg total) by mouth at bedtime. 90 tablet 3   sildenafil (VIAGRA) 100 MG tablet Take 100 mg by mouth daily as needed for erectile dysfunction.     SINGULAIR  10 MG tablet Take 10 mg by mouth at bedtime.     sitaGLIPtin (JANUVIA) 100 MG tablet Take 100 mg by mouth daily.     Testosterone 1.62 % GEL Place 2 Pump onto the skin daily.     valsartan  (DIOVAN ) 160 MG tablet Take 1 tablet (160 mg total) by mouth daily. 90 tablet 3   No current facility-administered medications for this visit.    Allergies  Allergen Reactions   Dextromethorphan  Nausea And Vomiting    Social History   Socioeconomic History   Marital status: Married    Spouse name: Not on file   Number of children: 2   Years of education: Not on file   Highest education level: Not on file  Occupational History    Employer: [employer]  Tobacco Use   Smoking status: Never   Smokeless tobacco: Never  Vaping Use   Vaping  status: Never Used  Substance and Sexual Activity   Alcohol use: Yes    Alcohol/week: 1.0 standard drink of alcohol    Types: 1 drink(s) per week   Drug use: No   Sexual activity: Not on file  Other Topics Concern   Not on file  Social History Narrative   [place] . He works at the post office. He denies tobacco use, drinks alcohol 2-3 times per week, and uses energy  supplements occasionally   Social Drivers of Corporate investment banker Strain: Not on file  Food Insecurity: Low Risk  (03/26/2024)   Received from [facility]   Hunger Vital Sign    Within the past 12 months, you worried that your food would run out before you got money to buy more: Never true    Within the past 12 months, the food you bought just didn't last and you didn't have money to get more. : Never true  Transportation Needs: No Transportation Needs (03/26/2024)   Received from [facility]    In the past 12 months, has lack of reliable transportation kept you from medical appointments, meetings, work or from getting things needed for daily living? : No  Physical Activity: Unknown (07/01/2021)   Received from [facility] visits prior to 11/04/2022.   Exercise Vital Sign    On average, how many days per week do you engage in moderate to strenuous exercise (like a brisk walk)?: Patient refused    Minutes of Exercise per Session: Not on file  Stress: Unknown (07/01/2021)   Received from [facility] visits prior to 11/04/2022.   [employer] of Occupational Health - Occupational Stress Questionnaire    Feeling of Stress : Patient refused  Social Connections: Unknown (07/01/2021)   Received from [facility] visits prior to 11/04/2022.   Social Connection and Isolation Panel    In a typical week, how many times do you talk on the phone with family, friends, or neighbors?: Patient refused     How often do you get together with friends or relatives?: Patient refused    How often do you attend church or religious services?: Patient refused    Do you belong to any clubs or organizations such as church groups, unions, fraternal or athletic groups, or school groups?: Patient refused    How often do you attend meetings of the clubs or organizations you belong to?: Patient refused    Are you married, widowed, divorced, separated, never married, or living with a partner?: Patient refused  Intimate Partner Violence: Unknown (07/01/2021)   Received from [facility] visits prior to 11/04/2022.   Humiliation, Afraid, Rape, and Kick questionnaire    Within the last year, have you been afraid of your partner or ex-partner?: Patient refused    Within the last year, have you been humiliated or emotionally abused in other ways by your partner or ex-partner?: Patient refused    Within the last year, have you been kicked, hit, slapped, or otherwise physically hurt by your partner or ex-partner?: Patient refused    Within the last year, have you been raped or forced to have any kind of sexual activity by your partner or ex-partner?: Patient refused    Family History  Problem Relation Age of Onset   Heart attack Father        age 32   Coronary artery disease Brother        onset  CAD age 42   Review of Systems:  As stated in the HPI and otherwise negative.   BP 113/67 (BP Location: Left Arm, Patient Position: Sitting, Cuff Size: Normal)   Pulse 76   Ht 5' 5 (1.651 m)   Wt 145 lb (65.8 kg)   SpO2 98%   BMI 24.13 kg/m   Physical Examination:  General: Well developed, well nourished, NAD  HEENT: OP clear, mucus  membranes moist  SKIN: warm, dry. No rashes. Neuro: No focal deficits  Musculoskeletal: Muscle strength 5/5 all ext  Psychiatric: Mood and affect normal  Neck: No JVD, no carotid bruits, no thyromegaly, no lymphadenopathy.  Lungs:Clear bilaterally, no  wheezes, rhonci, crackles Cardiovascular: Regular rate and rhythm. No murmurs, gallops or rubs. Abdomen:Soft. Bowel sounds present. Non-tender.  Extremities: No lower extremity edema. Pulses are 2 + in the bilateral DP/PT.  EKG:  EKG is  ordered today. The ekg ordered today demonstrates  EKG Interpretation Date/Time:  Thursday June 12 2024 08:41:25 EDT Ventricular Rate:  68 PR Interval:  204 QRS Duration:  94 QT Interval:  424 QTC Calculation: 450 R Axis:   81  Text Interpretation: Normal sinus rhythm Normal ECG When compared with ECG of 04-Jan-2023 09:52, No significant change was found Confirmed by Verlin Bruckner [phone]) on 06/12/2024 8:44:22 AM   Recent Labs: No results found for requested labs within last 365 days.   Lipid Panel    Component Value Date/Time   CHOL 141 11/17/2013 0255   TRIG 289 (H) 11/17/2013 0255   HDL 43 11/17/2013 0255   CHOLHDL 3.3 11/17/2013 0255   VLDL 58 (H) 11/17/2013 0255   LDLCALC 40 11/17/2013 0255     Wt Readings from Last 3 Encounters:  06/12/24 145 lb (65.8 kg)  06/08/23 150 lb (68 kg)  01/25/23 144 lb 9.6 oz (65.6 kg)    Assessment and Plan:   1. CAD with stable angina: No chest pain. Continue ASA and Plavix  given multi-vessel stenting. Continue statin, Zetia , Diovan  and Toprol .   2. HTN: BP is controlled. Continue Toprol  on Diovan   3. HLD: Lipids followed in primary care. LDL 46 on 06/08/24. Continue statin and Zetia   Labs/ tests ordered today include:   Orders Placed This Encounter  Procedures   EKG 12-Lead   Disposition:   F/U with me in 12  months  Signed, Bruckner Verlin, MD 06/12/2024 9:09 AM    [facility] [address] [place], [place], [place]  [address] Phone: [phone]; Fax: [phone]    .

## 2024-07-10 ENCOUNTER — Ambulatory Visit: Admitting: Cardiovascular Disease

## 2024-07-16 ENCOUNTER — Telehealth: Payer: Self-pay | Admitting: Pharmacy Technician

## 2024-07-16 NOTE — Telephone Encounter (Signed)
   Pharmacy Patient Advocate Encounter   Received notification from CoverMyMeds that prior authorization for pantoprazole  is required/requested.   Insurance verification completed.   The patient is insured through [facility].   Per test claim: PA required; PA submitted to above mentioned insurance via Latent Key/confirmation #/EOC [id number] Status is pending

## 2024-07-16 NOTE — Telephone Encounter (Signed)
 Pharmacy Patient Advocate Encounter  Received notification from [facility] that Prior Authorization for pantoprazole  has been APPROVED from 07/16/24 to 07/16/25   PA #/Case ID/Reference #: [id number]

## 2024-08-08 ENCOUNTER — Other Ambulatory Visit: Payer: Self-pay | Admitting: Cardiovascular Disease

## 2024-08-18 ENCOUNTER — Other Ambulatory Visit: Payer: Self-pay | Admitting: Cardiovascular Disease

## 2024-09-06 ENCOUNTER — Other Ambulatory Visit: Payer: Self-pay | Admitting: Physician Assistant
